# Patient Record
Sex: Female | Born: 1975 | Race: White | Hispanic: No | Marital: Married | Smoking: Former smoker
Health system: Southern US, Community
[De-identification: ages and names within clinical notes are randomized; demographics above are authoritative.]

## PROBLEM LIST (undated history)

## (undated) ENCOUNTER — Inpatient Hospital Stay (HOSPITAL_COMMUNITY): Payer: Self-pay

## (undated) DIAGNOSIS — R0602 Shortness of breath: Secondary | ICD-10-CM

## (undated) DIAGNOSIS — I499 Cardiac arrhythmia, unspecified: Secondary | ICD-10-CM

## (undated) DIAGNOSIS — R112 Nausea with vomiting, unspecified: Secondary | ICD-10-CM

## (undated) DIAGNOSIS — E039 Hypothyroidism, unspecified: Secondary | ICD-10-CM

## (undated) DIAGNOSIS — C801 Malignant (primary) neoplasm, unspecified: Secondary | ICD-10-CM

## (undated) DIAGNOSIS — D649 Anemia, unspecified: Secondary | ICD-10-CM

## (undated) DIAGNOSIS — E079 Disorder of thyroid, unspecified: Secondary | ICD-10-CM

## (undated) DIAGNOSIS — K219 Gastro-esophageal reflux disease without esophagitis: Secondary | ICD-10-CM

## (undated) DIAGNOSIS — F419 Anxiety disorder, unspecified: Secondary | ICD-10-CM

## (undated) DIAGNOSIS — O09529 Supervision of elderly multigravida, unspecified trimester: Secondary | ICD-10-CM

## (undated) DIAGNOSIS — R51 Headache: Secondary | ICD-10-CM

## (undated) DIAGNOSIS — E282 Polycystic ovarian syndrome: Secondary | ICD-10-CM

## (undated) DIAGNOSIS — Z9889 Other specified postprocedural states: Secondary | ICD-10-CM

## (undated) HISTORY — PX: GASTRIC BYPASS: SHX52

## (undated) HISTORY — DX: Anemia, unspecified: D64.9

## (undated) HISTORY — DX: Supervision of elderly multigravida, unspecified trimester: O09.529

## (undated) HISTORY — DX: Polycystic ovarian syndrome: E28.2

## (undated) HISTORY — PX: TONSILLECTOMY AND ADENOIDECTOMY: SHX28

## (undated) HISTORY — PX: EYE SURGERY: SHX253

## (undated) HISTORY — PX: OVARIAN CYST REMOVAL: SHX89

## (undated) HISTORY — PX: OTHER SURGICAL HISTORY: SHX169

## (undated) HISTORY — PX: THYROIDECTOMY: SHX17

## (undated) MED FILL — Iron Sucrose Inj 20 MG/ML (Fe Equiv): INTRAVENOUS | Qty: 15 | Status: AC

---
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
## 1997-10-13 ENCOUNTER — Ambulatory Visit (HOSPITAL_COMMUNITY): Admission: RE | Admit: 1997-10-13 | Discharge: 1997-10-13 | Payer: Self-pay | Admitting: Obstetrics

## 1997-10-17 ENCOUNTER — Inpatient Hospital Stay (HOSPITAL_COMMUNITY): Admission: AD | Admit: 1997-10-17 | Discharge: 1997-10-17 | Payer: Self-pay | Admitting: Obstetrics

## 1998-02-19 ENCOUNTER — Ambulatory Visit (HOSPITAL_COMMUNITY): Admission: RE | Admit: 1998-02-19 | Discharge: 1998-02-19 | Payer: Self-pay | Admitting: Obstetrics

## 1998-02-25 ENCOUNTER — Inpatient Hospital Stay (HOSPITAL_COMMUNITY): Admission: AD | Admit: 1998-02-25 | Discharge: 1998-02-25 | Payer: Self-pay | Admitting: Obstetrics

## 1998-05-11 ENCOUNTER — Inpatient Hospital Stay (HOSPITAL_COMMUNITY): Admission: AD | Admit: 1998-05-11 | Discharge: 1998-05-11 | Payer: Self-pay | Admitting: Obstetrics

## 1998-05-24 ENCOUNTER — Inpatient Hospital Stay (HOSPITAL_COMMUNITY): Admission: AD | Admit: 1998-05-24 | Discharge: 1998-05-27 | Payer: Self-pay | Admitting: Obstetrics

## 1998-11-22 ENCOUNTER — Encounter: Admission: RE | Admit: 1998-11-22 | Discharge: 1998-11-22 | Payer: Self-pay | Admitting: Internal Medicine

## 1999-03-22 ENCOUNTER — Other Ambulatory Visit: Admission: RE | Admit: 1999-03-22 | Discharge: 1999-03-22 | Payer: Self-pay | Admitting: Obstetrics

## 1999-05-20 ENCOUNTER — Inpatient Hospital Stay (HOSPITAL_COMMUNITY): Admission: AD | Admit: 1999-05-20 | Discharge: 1999-05-20 | Payer: Self-pay | Admitting: Obstetrics

## 1999-07-25 ENCOUNTER — Inpatient Hospital Stay (HOSPITAL_COMMUNITY): Admission: AD | Admit: 1999-07-25 | Discharge: 1999-07-25 | Payer: Self-pay | Admitting: *Deleted

## 1999-11-10 ENCOUNTER — Other Ambulatory Visit: Admission: RE | Admit: 1999-11-10 | Discharge: 1999-11-10 | Payer: Self-pay | Admitting: Obstetrics

## 2000-02-21 ENCOUNTER — Inpatient Hospital Stay (HOSPITAL_COMMUNITY): Admission: AD | Admit: 2000-02-21 | Discharge: 2000-02-21 | Payer: Self-pay | Admitting: Obstetrics

## 2000-05-03 ENCOUNTER — Inpatient Hospital Stay (HOSPITAL_COMMUNITY): Admission: AD | Admit: 2000-05-03 | Discharge: 2000-05-03 | Payer: Self-pay | Admitting: Obstetrics

## 2000-05-15 ENCOUNTER — Inpatient Hospital Stay (HOSPITAL_COMMUNITY): Admission: AD | Admit: 2000-05-15 | Discharge: 2000-05-17 | Payer: Self-pay

## 2001-04-22 ENCOUNTER — Encounter: Payer: Self-pay | Admitting: Emergency Medicine

## 2001-04-22 ENCOUNTER — Emergency Department (HOSPITAL_COMMUNITY): Admission: EM | Admit: 2001-04-22 | Discharge: 2001-04-22 | Payer: Self-pay | Admitting: Emergency Medicine

## 2002-09-30 ENCOUNTER — Inpatient Hospital Stay (HOSPITAL_COMMUNITY): Admission: AD | Admit: 2002-09-30 | Discharge: 2002-09-30 | Payer: Self-pay | Admitting: Obstetrics

## 2002-10-22 ENCOUNTER — Emergency Department (HOSPITAL_COMMUNITY): Admission: EM | Admit: 2002-10-22 | Discharge: 2002-10-22 | Payer: Self-pay | Admitting: Emergency Medicine

## 2003-04-04 ENCOUNTER — Inpatient Hospital Stay (HOSPITAL_COMMUNITY): Admission: AD | Admit: 2003-04-04 | Discharge: 2003-04-04 | Payer: Self-pay | Admitting: Obstetrics

## 2003-04-07 ENCOUNTER — Inpatient Hospital Stay (HOSPITAL_COMMUNITY): Admission: AD | Admit: 2003-04-07 | Discharge: 2003-04-07 | Payer: Self-pay | Admitting: *Deleted

## 2003-04-14 ENCOUNTER — Encounter: Payer: Self-pay | Admitting: *Deleted

## 2003-04-14 ENCOUNTER — Inpatient Hospital Stay (HOSPITAL_COMMUNITY): Admission: AD | Admit: 2003-04-14 | Discharge: 2003-04-14 | Payer: Self-pay | Admitting: *Deleted

## 2003-05-01 ENCOUNTER — Encounter: Payer: Self-pay | Admitting: *Deleted

## 2003-05-01 ENCOUNTER — Ambulatory Visit (HOSPITAL_COMMUNITY): Admission: RE | Admit: 2003-05-01 | Discharge: 2003-05-01 | Payer: Self-pay | Admitting: *Deleted

## 2003-08-27 ENCOUNTER — Inpatient Hospital Stay (HOSPITAL_COMMUNITY): Admission: AD | Admit: 2003-08-27 | Discharge: 2003-08-27 | Payer: Self-pay | Admitting: Obstetrics

## 2003-08-29 HISTORY — PX: OTHER SURGICAL HISTORY: SHX169

## 2004-01-12 ENCOUNTER — Ambulatory Visit (HOSPITAL_COMMUNITY): Admission: RE | Admit: 2004-01-12 | Discharge: 2004-01-12 | Payer: Self-pay | Admitting: Obstetrics

## 2004-08-16 ENCOUNTER — Ambulatory Visit (HOSPITAL_COMMUNITY): Admission: RE | Admit: 2004-08-16 | Discharge: 2004-08-16 | Payer: Self-pay | Admitting: Obstetrics & Gynecology

## 2004-08-18 ENCOUNTER — Ambulatory Visit (HOSPITAL_COMMUNITY): Admission: RE | Admit: 2004-08-18 | Discharge: 2004-08-18 | Payer: Self-pay | Admitting: Obstetrics & Gynecology

## 2004-09-24 ENCOUNTER — Emergency Department (HOSPITAL_COMMUNITY): Admission: EM | Admit: 2004-09-24 | Discharge: 2004-09-24 | Payer: Self-pay | Admitting: Family Medicine

## 2005-04-02 ENCOUNTER — Inpatient Hospital Stay (HOSPITAL_COMMUNITY): Admission: AD | Admit: 2005-04-02 | Discharge: 2005-04-02 | Payer: Self-pay | Admitting: Obstetrics & Gynecology

## 2005-06-07 ENCOUNTER — Inpatient Hospital Stay (HOSPITAL_COMMUNITY): Admission: AD | Admit: 2005-06-07 | Discharge: 2005-06-07 | Payer: Self-pay | Admitting: Obstetrics and Gynecology

## 2005-07-28 ENCOUNTER — Inpatient Hospital Stay (HOSPITAL_COMMUNITY): Admission: AD | Admit: 2005-07-28 | Discharge: 2005-07-28 | Payer: Self-pay | Admitting: Obstetrics and Gynecology

## 2005-08-05 ENCOUNTER — Inpatient Hospital Stay (HOSPITAL_COMMUNITY): Admission: AD | Admit: 2005-08-05 | Discharge: 2005-08-05 | Payer: Self-pay | Admitting: Obstetrics and Gynecology

## 2005-08-23 ENCOUNTER — Inpatient Hospital Stay (HOSPITAL_COMMUNITY): Admission: AD | Admit: 2005-08-23 | Discharge: 2005-08-23 | Payer: Self-pay | Admitting: Obstetrics and Gynecology

## 2005-10-31 ENCOUNTER — Inpatient Hospital Stay (HOSPITAL_COMMUNITY): Admission: AD | Admit: 2005-10-31 | Discharge: 2005-11-02 | Payer: Self-pay | Admitting: Obstetrics and Gynecology

## 2007-09-18 ENCOUNTER — Encounter (HOSPITAL_COMMUNITY): Admission: RE | Admit: 2007-09-18 | Discharge: 2007-12-17 | Payer: Self-pay | Admitting: Cardiology

## 2008-03-08 ENCOUNTER — Inpatient Hospital Stay (HOSPITAL_COMMUNITY): Admission: AD | Admit: 2008-03-08 | Discharge: 2008-03-08 | Payer: Self-pay | Admitting: *Deleted

## 2008-03-12 ENCOUNTER — Encounter (INDEPENDENT_AMBULATORY_CARE_PROVIDER_SITE_OTHER): Payer: Self-pay | Admitting: Obstetrics and Gynecology

## 2008-03-12 ENCOUNTER — Ambulatory Visit (HOSPITAL_COMMUNITY): Admission: RE | Admit: 2008-03-12 | Discharge: 2008-03-12 | Payer: Self-pay | Admitting: Obstetrics and Gynecology

## 2008-03-13 ENCOUNTER — Encounter (INDEPENDENT_AMBULATORY_CARE_PROVIDER_SITE_OTHER): Payer: Self-pay | Admitting: Obstetrics and Gynecology

## 2008-04-27 ENCOUNTER — Emergency Department (HOSPITAL_COMMUNITY): Admission: EM | Admit: 2008-04-27 | Discharge: 2008-04-27 | Payer: Self-pay | Admitting: Family Medicine

## 2008-08-28 DIAGNOSIS — C801 Malignant (primary) neoplasm, unspecified: Secondary | ICD-10-CM

## 2008-08-28 HISTORY — DX: Malignant (primary) neoplasm, unspecified: C80.1

## 2008-11-16 ENCOUNTER — Encounter (HOSPITAL_COMMUNITY): Admission: RE | Admit: 2008-11-16 | Discharge: 2009-02-14 | Payer: Self-pay | Admitting: Endocrinology

## 2008-11-26 ENCOUNTER — Ambulatory Visit (HOSPITAL_COMMUNITY): Admission: RE | Admit: 2008-11-26 | Discharge: 2008-11-26 | Payer: Self-pay | Admitting: Endocrinology

## 2008-12-08 ENCOUNTER — Encounter: Admission: RE | Admit: 2008-12-08 | Discharge: 2008-12-08 | Payer: Self-pay | Admitting: Endocrinology

## 2008-12-08 ENCOUNTER — Other Ambulatory Visit: Admission: RE | Admit: 2008-12-08 | Discharge: 2008-12-08 | Payer: Self-pay | Admitting: Interventional Radiology

## 2008-12-08 ENCOUNTER — Encounter (INDEPENDENT_AMBULATORY_CARE_PROVIDER_SITE_OTHER): Payer: Self-pay | Admitting: Interventional Radiology

## 2009-01-05 ENCOUNTER — Emergency Department (HOSPITAL_COMMUNITY): Admission: EM | Admit: 2009-01-05 | Discharge: 2009-01-06 | Payer: Self-pay | Admitting: Emergency Medicine

## 2009-03-04 ENCOUNTER — Encounter (INDEPENDENT_AMBULATORY_CARE_PROVIDER_SITE_OTHER): Payer: Self-pay | Admitting: Surgery

## 2009-03-05 ENCOUNTER — Inpatient Hospital Stay (HOSPITAL_COMMUNITY): Admission: AD | Admit: 2009-03-05 | Discharge: 2009-03-06 | Payer: Self-pay | Admitting: Surgery

## 2009-03-08 ENCOUNTER — Emergency Department (HOSPITAL_COMMUNITY): Admission: EM | Admit: 2009-03-08 | Discharge: 2009-03-09 | Payer: Self-pay | Admitting: Emergency Medicine

## 2009-04-12 ENCOUNTER — Encounter (HOSPITAL_COMMUNITY): Admission: RE | Admit: 2009-04-12 | Discharge: 2009-05-27 | Payer: Self-pay | Admitting: Endocrinology

## 2009-11-02 ENCOUNTER — Encounter (INDEPENDENT_AMBULATORY_CARE_PROVIDER_SITE_OTHER): Payer: Self-pay | Admitting: *Deleted

## 2009-12-08 ENCOUNTER — Inpatient Hospital Stay (HOSPITAL_COMMUNITY): Admission: AD | Admit: 2009-12-08 | Discharge: 2009-12-08 | Payer: Self-pay | Admitting: Obstetrics & Gynecology

## 2009-12-10 ENCOUNTER — Ambulatory Visit: Payer: Self-pay | Admitting: Gastroenterology

## 2009-12-10 ENCOUNTER — Encounter (INDEPENDENT_AMBULATORY_CARE_PROVIDER_SITE_OTHER): Payer: Self-pay | Admitting: *Deleted

## 2009-12-10 DIAGNOSIS — K5900 Constipation, unspecified: Secondary | ICD-10-CM | POA: Insufficient documentation

## 2009-12-13 LAB — CONVERTED CEMR LAB
BUN: 9 mg/dL (ref 6–23)
Basophils Absolute: 0 10*3/uL (ref 0.0–0.1)
Basophils Relative: 0.6 % (ref 0.0–3.0)
CO2: 26 meq/L (ref 19–32)
Calcium: 8.5 mg/dL (ref 8.4–10.5)
Chloride: 105 meq/L (ref 96–112)
Creatinine, Ser: 0.6 mg/dL (ref 0.4–1.2)
Eosinophils Absolute: 0.1 10*3/uL (ref 0.0–0.7)
Eosinophils Relative: 1.5 % (ref 0.0–5.0)
GFR calc non Af Amer: 123.32 mL/min (ref >60)
Glucose, Bld: 68 mg/dL — ABNORMAL LOW (ref 70–99)
HCT: 36.2 % (ref 36.0–46.0)
Hemoglobin: 12.3 g/dL (ref 12.0–15.0)
Lymphocytes Relative: 36.7 % (ref 12.0–46.0)
Lymphs Abs: 2.9 10*3/uL (ref 0.7–4.0)
MCHC: 33.9 g/dL (ref 30.0–36.0)
MCV: 80 fL (ref 78.0–100.0)
Monocytes Absolute: 0.5 10*3/uL (ref 0.1–1.0)
Monocytes Relative: 6.6 % (ref 3.0–12.0)
Neutro Abs: 4.4 10*3/uL (ref 1.4–7.7)
Neutrophils Relative %: 54.6 % (ref 43.0–77.0)
Platelets: 405 10*3/uL — ABNORMAL HIGH (ref 150.0–400.0)
Potassium: 3.7 meq/L (ref 3.5–5.1)
RBC: 4.52 M/uL (ref 3.87–5.11)
RDW: 15.4 % — ABNORMAL HIGH (ref 11.5–14.6)
Sodium: 142 meq/L (ref 135–145)
TSH: 1.9 microintl units/mL (ref 0.35–5.50)
WBC: 8 10*3/uL (ref 4.5–10.5)

## 2009-12-20 ENCOUNTER — Ambulatory Visit: Payer: Self-pay | Admitting: Gastroenterology

## 2009-12-21 ENCOUNTER — Encounter (INDEPENDENT_AMBULATORY_CARE_PROVIDER_SITE_OTHER): Payer: Self-pay | Admitting: *Deleted

## 2009-12-21 ENCOUNTER — Encounter: Payer: Self-pay | Admitting: Gastroenterology

## 2010-01-28 ENCOUNTER — Encounter: Admission: RE | Admit: 2010-01-28 | Discharge: 2010-01-28 | Payer: Self-pay | Admitting: Surgery

## 2010-02-08 ENCOUNTER — Ambulatory Visit (HOSPITAL_COMMUNITY): Admission: RE | Admit: 2010-02-08 | Discharge: 2010-02-08 | Payer: Self-pay | Admitting: Surgery

## 2010-05-30 ENCOUNTER — Encounter (HOSPITAL_COMMUNITY)
Admission: RE | Admit: 2010-05-30 | Discharge: 2010-08-28 | Payer: Self-pay | Source: Home / Self Care | Attending: Endocrinology | Admitting: Endocrinology

## 2010-07-18 IMAGING — CR DG NECK SOFT TISSUE
3 series · 3 of 3 positions shown · non-contrast
Comparison: None

CLINICAL DATA: Preop, cough, thyroid removal.

NECK SOFT TISSUES - 1+ VIEW

[w soft tissue neck * (1 of 3)]
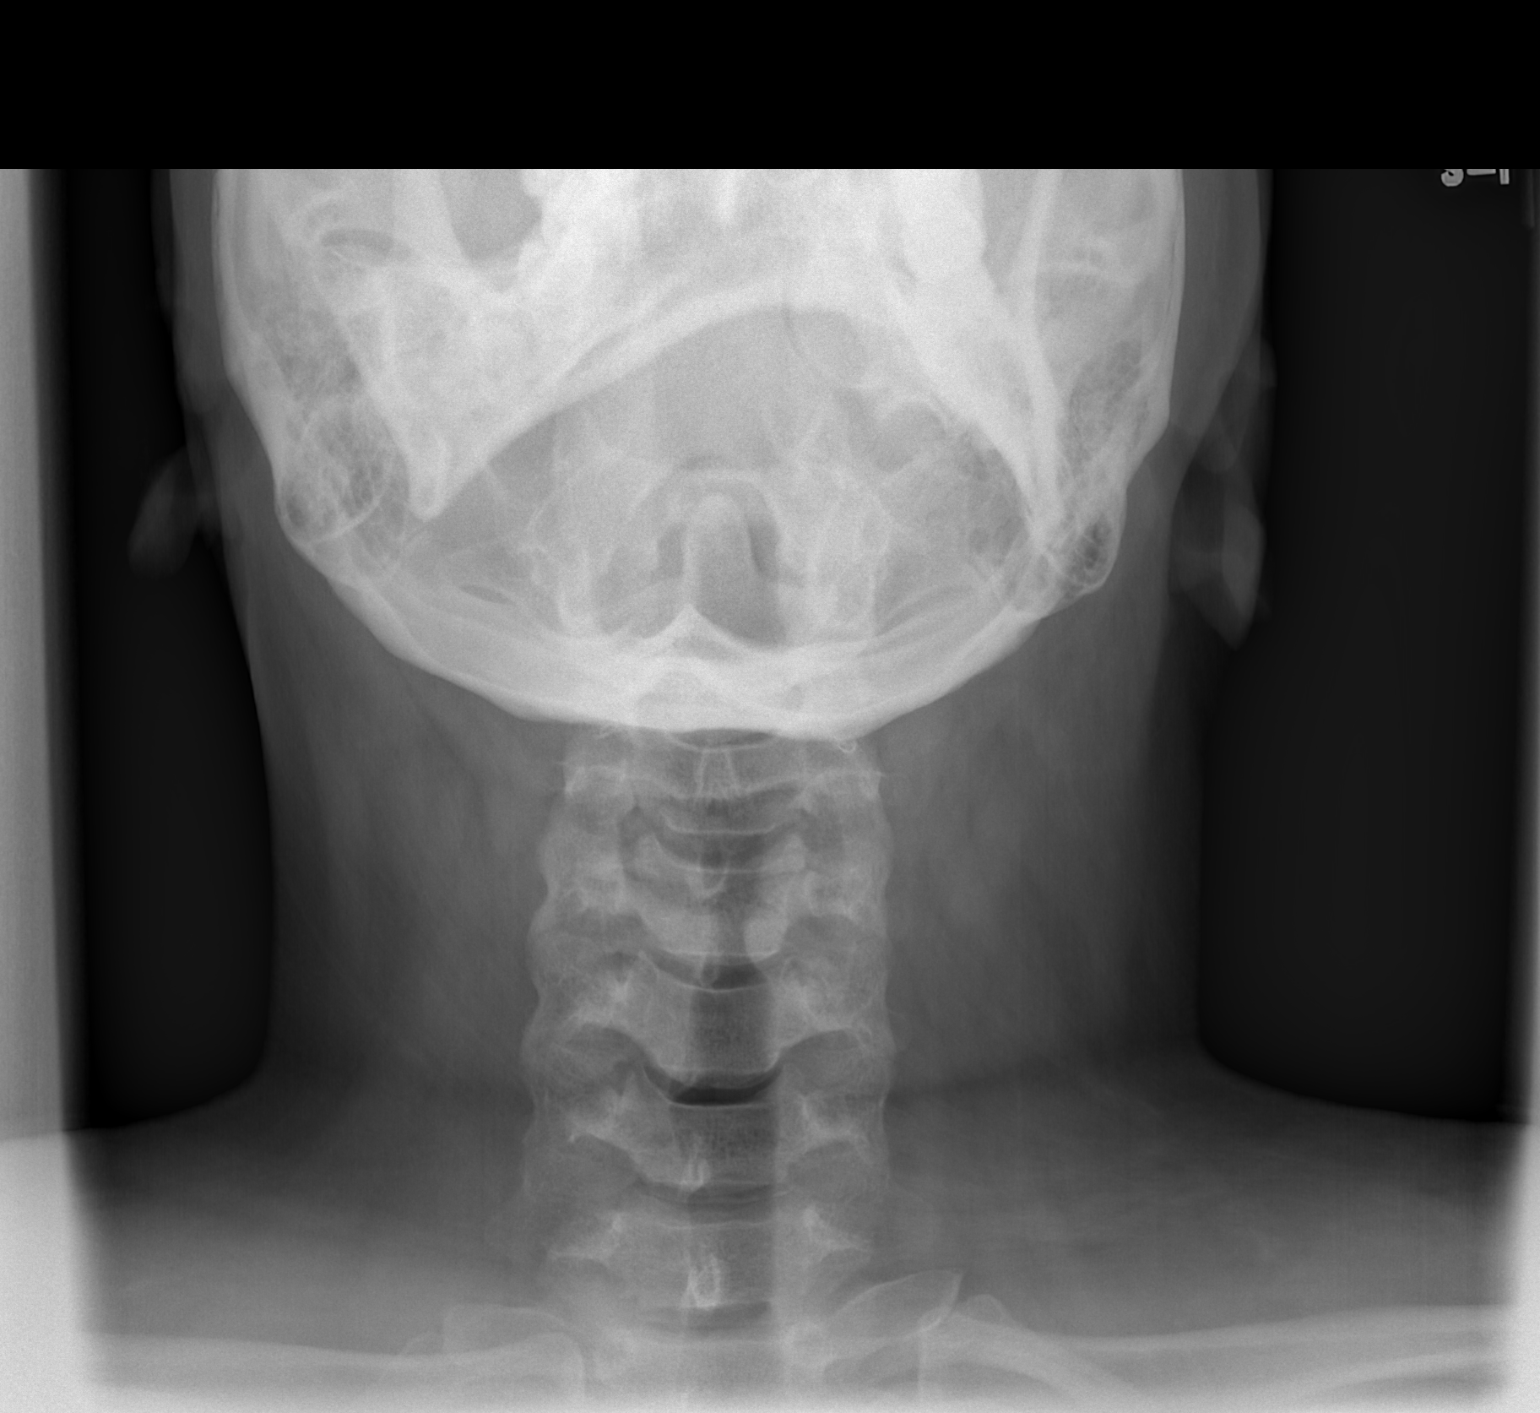

[w soft tissue neck * (2 of 3)]
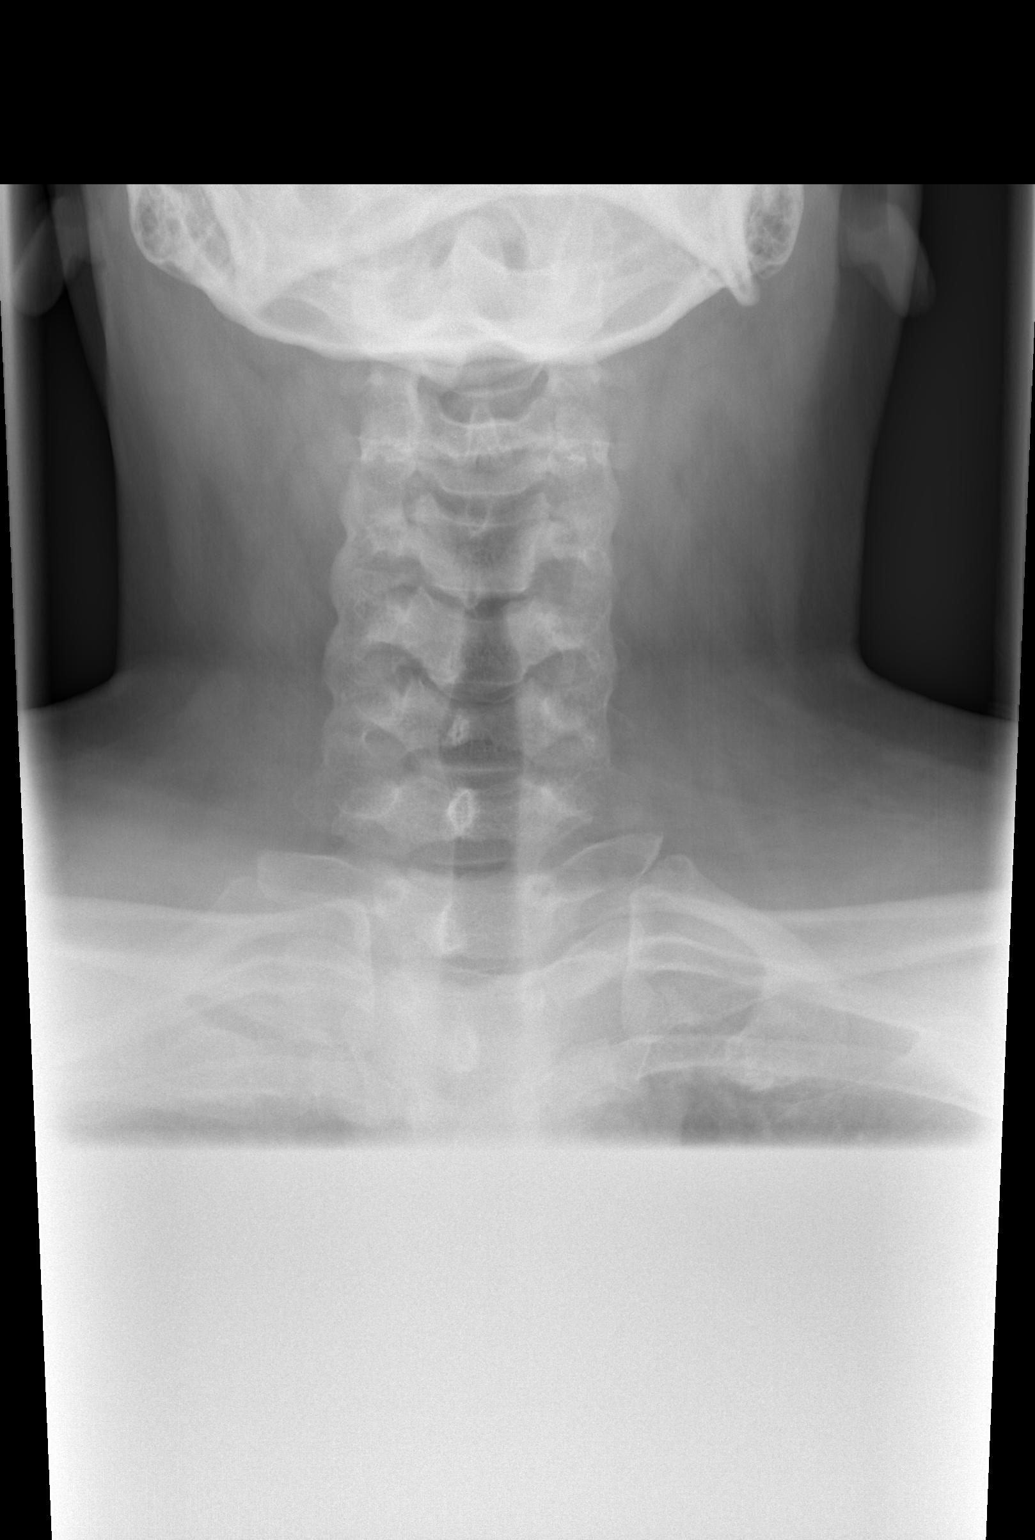

[w soft tissue neck * (3 of 3)]
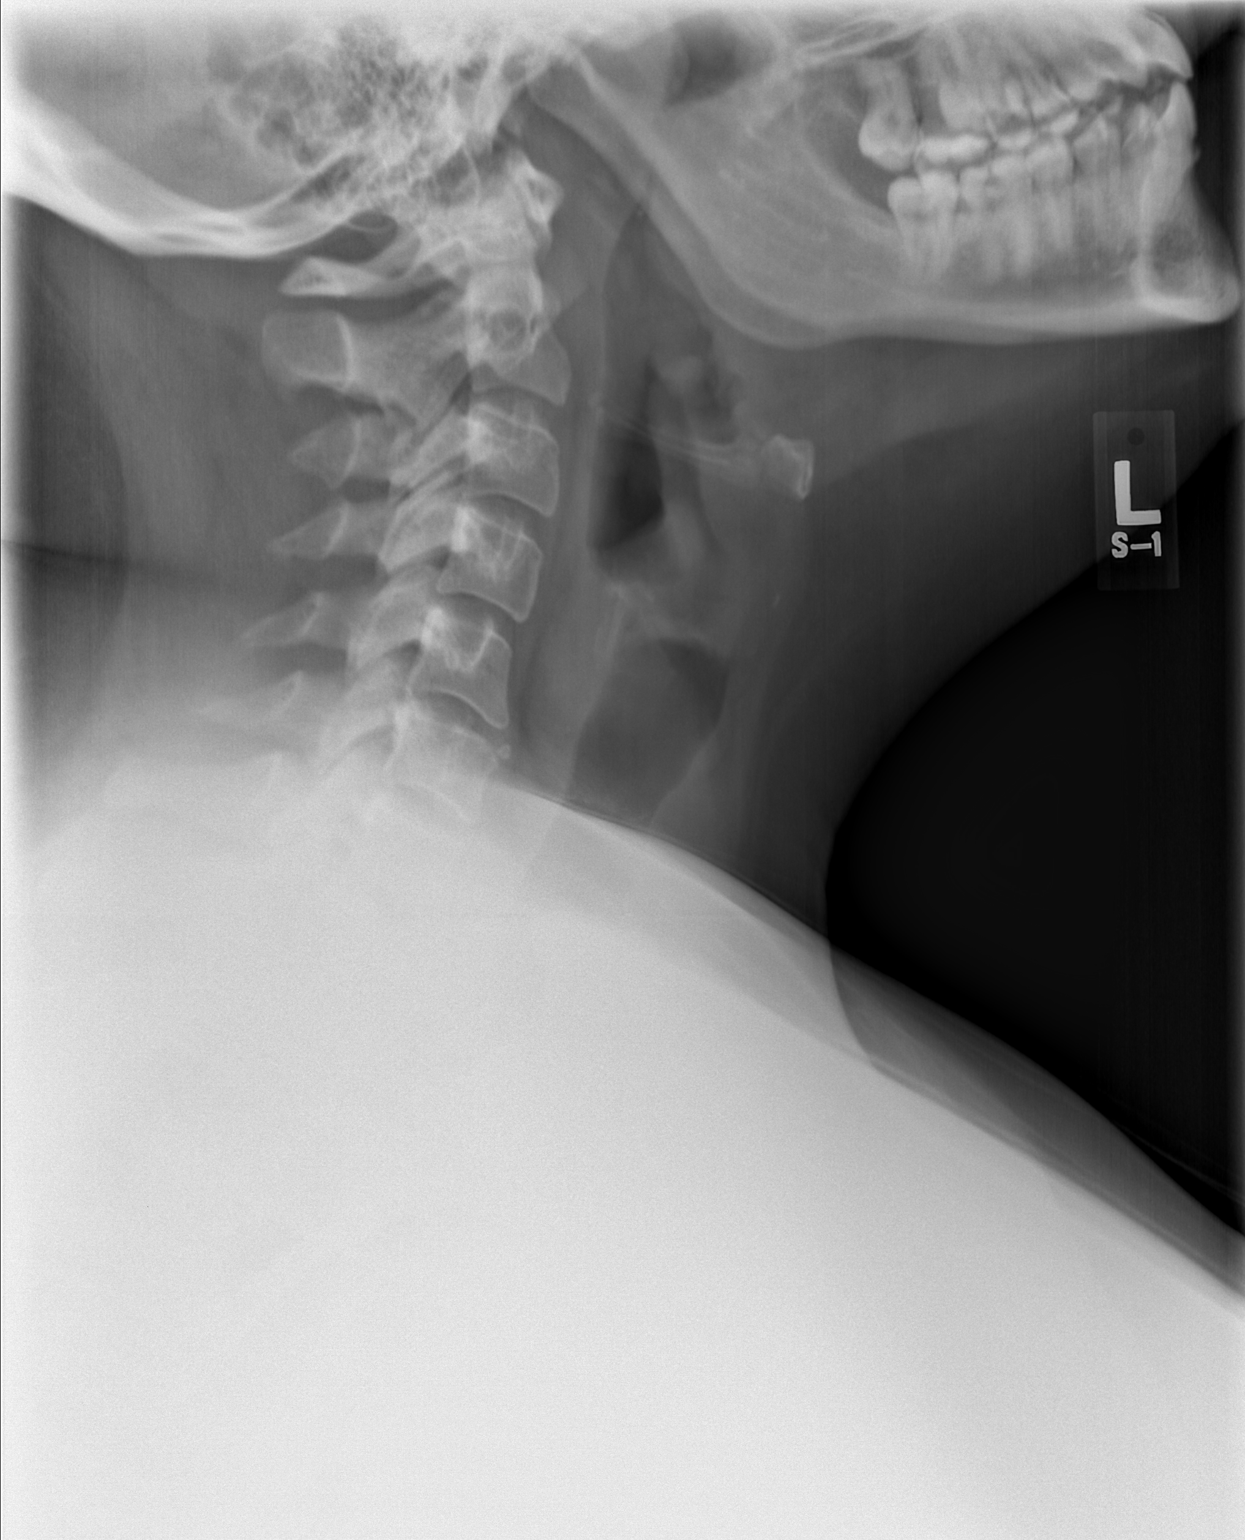

[3 of 3 positions shown; findings below may reference images not displayed]

FINDINGS: Prevertebral soft tissues are within normal limits.
Epiglottic and aryepiglottic fold shadows are sharp.  There are
mild degenerative changes in the cervical spine.
IMPRESSION: No acute findings.

## 2010-08-16 ENCOUNTER — Emergency Department (HOSPITAL_COMMUNITY)
Admission: EM | Admit: 2010-08-16 | Discharge: 2010-08-16 | Payer: Self-pay | Source: Home / Self Care | Admitting: Emergency Medicine

## 2010-08-19 ENCOUNTER — Encounter
Admission: RE | Admit: 2010-08-19 | Discharge: 2010-08-19 | Payer: Self-pay | Source: Home / Self Care | Attending: Otolaryngology | Admitting: Otolaryngology

## 2010-09-18 ENCOUNTER — Encounter: Payer: Self-pay | Admitting: Endocrinology

## 2010-09-18 ENCOUNTER — Encounter: Payer: Self-pay | Admitting: Cardiology

## 2010-09-27 NOTE — Letter (Signed)
Summary: Return to Work  [facility]  [address] [place], [place] [address]   Phone: [phone]  Fax: [phone]    12/10/2009  TO: Leodis Sias IT MAY CONCERN  RE: [name] [address] [place]   The above named individual is under my medical care and may return to work on: 12/13/09     If you have any further questions or need additional information, please call.     Sincerely,   Rob Bunting MD typed by: [name] CMA (AAMA)

## 2010-09-27 NOTE — Assessment & Plan Note (Signed)
History of Present Illness Primary GI MD: Rob Bunting MD Primary Provider: Karleen Hampshire, MD Requesting Provider: Karleen Hampshire, MD Chief Complaint: abdominal pain, constipation, rectal bleeding with irritation History of Present Illness:      very pleasant, morbidly obese 35 year old woman who has been troubled with intermittent constipation for many years.  She had surgery July 2011, thyroidectomy and her constipation has been worse since then.   She has tried dulcolax, sennekot, miralax, fiber supplement.    Most recently sennekot 2-3 pills a day.  She will usually have 1-2 BM a week. She will have to push, strain a lot.  Does see intermittently.  Has anal discomfort.  Last TSH check was in January, was normal at that point.  Sees Dr. Lisabeth Devoid.    recently has gained about 15 punds in past 6 months.           Current Medications (verified): 1)  Levothroid 125 Mcg Tabs (Levothyroxine Sodium) .Marland Kitchen.. 1 Tablet By Mouth Two Times A Day 2)  Xanax 1 Mg Tabs (Alprazolam) .... 2 By Mouth Once Daily 3)  Pantoprazole Sodium 40 Mg Tbec (Pantoprazole Sodium) .Marland Kitchen.. 1 Tablet By Mouth Once Daily  Allergies (verified): No Known Drug Allergies  Past History:  Past Medical History: thyroid cancer, resected 2000 and GERD Anxiety Disorder Chronic constipation morbid obesity  Past Surgical History: Thyroid Surgery 2010 for  cancer  Family History: father had colon polyps Brother may have Crohn's disease  Social History: she is married, she has 3 children, she works in collections, she does not smoke cigarettes, she does not drink alcohol, she drinks one caffeinated beverage per day  Review of Systems       Pertinent positive and negative review of systems were noted in the above HPI and GI specific review of systems.  All other review of systems was otherwise negative.   Vital Signs:  Patient profile:   35 year old female Height:      65 inches Weight:      331  pounds BMI:     55.28 Pulse rate:   80 / minute Pulse rhythm:   regular BP sitting:   156 / 86  (left arm)  Vitals Entered By: Milford Cage NCMA (December 10, 2009 11:02 AM)  Physical Exam  Additional Exam:  Constitutional: morbidly obese, otherwise generally well appearing Psychiatric: alert and oriented times 3 Eyes: extraocular movements intact Mouth: oropharynx moist, no lesions Neck: supple, no lymphadenopathy Cardiovascular: heart regular rate and rythm Lungs: CTA bilaterally Abdomen: soft, non-tender, non-distended, no obvious ascites, no peritoneal signs, normal bowel sounds Extremities: no lower extremity edema bilaterally Skin: no lesions on visible extremities    Impression & Recommendations:  Problem # 1:  chronic constipation, intermittent right lower quadrant pains I think her constipation is the most important issue that she has. I suspect that is contributing to her abdominal discomforts. She does have Crohn's in the family, possibly, however people with Crohn's rarely have constipation. She will start a bowel urge regimen this week and with MiraLax and then start to MiraLax scoops every day. I hope to have her wean off of her stimulant laxatives over the next week or 2. We will proceed with colonoscopy in 2-3 weeks' time. She will get a CBC, complete metabolic profile, thyroid testing today.  Other Orders: TLB-CBC Platelet - w/Differential (85025-CBCD) TLB-BMP (Basic Metabolic Panel-BMET) (80048-METABOL) TLB-TSH (Thyroid Stimulating Hormone) (32202-RKY)  Patient Instructions: 1)  You will get lab test(s) done today (cbc, bmet,  tsh). 2)  You will be scheduled to have a colonoscopy (2-3 weeks). 3)  6 doses of miralax today, 4)  6 doses of miralax tomorrow, 5)  Then start 2 scoops of miralax every day.  Try to wean OFF the laxatives over 1-2 weeks. 6)  A copy of this information will be sent to PCP. 7)  The medication list was reviewed and reconciled.  All changed  / newly prescribed medications were explained.  A complete medication list was provided to the patient / caregiver.  Appended Document: Orders Update/movi    Clinical Lists Changes  Medications: Added new medication of MOVIPREP 100 GM  SOLR (PEG-KCL-NACL-NASULF-NA ASC-C) As per prep instructions. - Signed Rx of MOVIPREP 100 GM  SOLR (PEG-KCL-NACL-NASULF-NA ASC-C) As per prep instructions.;  #1 x 0;  Signed;  Entered by: [name] CMA (AAMA);  Authorized by: Rachael Fee MD;  Method used: [facility] to [id number] [address] [place], [place], [place]  [address], Ph: [phone], Fax: [phone] Orders: Added new Test order of Colonoscopy (Colon) - Signed    Prescriptions: MOVIPREP 100 GM  SOLR (PEG-KCL-NACL-NASULF-NA ASC-C) As per prep instructions.  #1 x 0   Entered by:   [name] CMA (AAMA)   Authorized by:   Rachael Fee MD   Signed by:   [name] CMA (AAMA) on 12/10/2009   Method used:   [facility]  [address] [phone]* (retail)       [address]       [place], [place]  [address]       Ph: [phone]       Fax: [phone]   RxID:   [id number]

## 2010-09-27 NOTE — Letter (Signed)
Summary: [facility] Instructions  [facility]  [address] [place], [place] [address]   Phone: [phone]  Fax: [phone]       [name]    March 23, 1976    MRN: [id number]        Procedure Day /Date:12/20/09     Arrival Time:8 am     Procedure Time:9 am     Location of Procedure:                    X  [facility] (4th Floor)                         PREPARATION FOR COLONOSCOPY WITH MOVIPREP   Starting 5 days prior to your procedure 12/15/09 do not eat nuts, seeds, popcorn, corn, beans, peas,  salads, or any raw vegetables.  Do not take any fiber supplements (e.g. Metamucil, Citrucel, and Benefiber).  THE DAY BEFORE YOUR PROCEDURE         DATE:12/19/09  DAY: SUN  1.  Drink clear liquids the entire day-NO SOLID FOOD  2.  Do not drink anything colored red or purple.  Avoid juices with pulp.  No orange juice.  3.  Drink at least 64 oz. (8 glasses) of fluid/clear liquids during the day to prevent dehydration and help the prep work efficiently.  CLEAR LIQUIDS INCLUDE: Water Jello Ice Popsicles Tea (sugar ok, no milk/cream) Powdered fruit flavored drinks Coffee (sugar ok, no milk/cream) Gatorade Juice: apple, white grape, white cranberry  Lemonade Clear bullion, consomm, broth Carbonated beverages (any kind) Strained chicken noodle soup Hard Candy                             4.  In the morning, mix first dose of MoviPrep solution:    Empty 1 Pouch A and 1 Pouch B into the disposable container    Add lukewarm drinking water to the top line of the container. Mix to dissolve    Refrigerate (mixed solution should be used within 24 hrs)  5.  Begin drinking the prep at 5:00 p.m. The MoviPrep container is divided by 4 marks.   Every 15 minutes drink the solution down to the next mark (approximately 8 oz) until the full liter is complete.   6.  Follow completed prep with 16 oz of clear liquid of your choice (Nothing red or purple).   Continue to drink clear liquids until bedtime.  7.  Before going to bed, mix second dose of MoviPrep solution:    Empty 1 Pouch A and 1 Pouch B into the disposable container    Add lukewarm drinking water to the top line of the container. Mix to dissolve    Refrigerate  THE DAY OF YOUR PROCEDURE      DATE: 12/20/09 DAY: MON  Beginning at 4 a.m. (5 hours before procedure):         1. Every 15 minutes, drink the solution down to the next mark (approx 8 oz) until the full liter is complete.  2. Follow completed prep with 16 oz. of clear liquid of your choice.    3. You may drink clear liquids until 7 am (2 HOURS BEFORE PROCEDURE).   MEDICATION INSTRUCTIONS  Unless otherwise instructed, you should take regular prescription medications with a small sip of water   as early as possible the morning of your procedure.  OTHER INSTRUCTIONS  You will need a responsible adult at least 33 years of age to accompany you and drive you home.   This person must remain in the waiting room during your procedure.  Wear loose fitting clothing that is easily removed.  Leave jewelry and other valuables at home.  However, you may wish to bring a book to read or  an iPod/MP3 player to listen to music as you wait for your procedure to start.  Remove all body piercing jewelry and leave at home.  Total time from sign-in until discharge is approximately 2-3 hours.  You should go home directly after your procedure and rest.  You can resume normal activities the  day after your procedure.  The day of your procedure you should not:   Drive   Make legal decisions   Operate machinery   Drink alcohol   Return to work  You will receive specific instructions about eating, activities and medications before you leave.    The above instructions have been reviewed and explained to me by   _______________________    I fully understand and can verbalize these instructions  _____________________________ Date _________

## 2010-09-27 NOTE — Letter (Signed)
Summary: Results Letter  [facility]  [address] [place], [place] [address]   Phone: [phone]  Fax: [phone]        December 21, 2009 MRN: [id number]    [facility] [address] [place], [place]  [address]    Dear [name],   Good news.  The polyp(s) that were removed during your recent procedure were NOT pre-cancerous.  You should continue to follow current colorectal cancer screening guidelines with a colonoscopy at age 59.  You should continue the recommnedations outlined after your colonoscopy, written on your colonoscopy report.    Sincerely,  Rachael Fee MD  This letter has been electronically signed by your physician.  Appended Document: Results Letter letter mailed 4.28.11

## 2010-09-27 NOTE — Procedures (Signed)
Summary: Colonoscopy  Patient: [name] Note: All result statuses are Final unless otherwise noted.  Tests: (1) Colonoscopy (COL)   COL Colonoscopy           DONE     [facility]     [address]     [place], [place]  [address]           COLONOSCOPY PROCEDURE REPORT           PATIENT:  [name], [name]  MR#:  [id number]     BIRTHDATE:  19-Dec-1975, 33 yrs. old  GENDER:  female     ENDOSCOPIST:  Rachael Fee, MD     REF. BY:  Karleen Hampshire, M.D.     PROCEDURE DATE:  12/20/2009     PROCEDURE:  Colonoscopy with biopsy     ASA CLASS:  Class II     INDICATIONS:  constipation, abdominal pains     MEDICATIONS:   Fentanyl 50 mcg IV, Versed 5 mg IV           DESCRIPTION OF PROCEDURE:   After the risks benefits and     alternatives of the procedure were thoroughly explained, informed     consent was obtained.  Digital rectal exam was performed and     revealed no rectal masses.   The Pentax Colonoscope [id number]     endoscope was introduced through the anus and advanced to the     terminal ileum which was intubated for a short distance, without     limitations.  The quality of the prep was adequate, using     MoviPrep.  The instrument was then slowly withdrawn as the colon     was fully examined.     <<PROCEDUREIMAGES>>           FINDINGS:  A diminutive polyp was found terminal ileum This was     1-23mm across, removed with forceps and sent to pathology (jar 1)     (see image4).  The terminal ileum appeared normal (see image2).     This was otherwise a normal examination of the colon (see image1,     image3, and image6).   Retroflexed views in the rectum revealed no     abnormalities.    The scope was then withdrawn from the patient     and the procedure completed.           COMPLICATIONS:  None     ENDOSCOPIC IMPRESSION:     1) Diminutive polyp in the terminal ileum     2) Normal terminal ileum     3) Otherwise normal examination        RECOMMENDATIONS:     1) If the polyp(s) removed today are proven to be adenomatous     (pre-cancerous) polyps, you will need a repeat colonoscopy in 5     years. Otherwise you should continue to follow colorectal cancer     screening guidelines for "routine risk" patients with colonoscopy     at age 63.     2) You will receive a letter within 1-2 weeks with the results     of your biopsy as well as final recommendations. Please call my     office if you have not received a letter after 3 weeks.     3) Continue two doses of miralax every day.     4) Dr. Christella Hartigan' office will contact you for a follow up appt in 5-6  weeks to see how your abdominal discomforts have responded to     bowel regulation with miralax.           ______________________________     Rachael Fee, MD           n.     eSIGNED:   Rachael Fee at 12/20/2009 09:18 AM           Ardyth Harps, [id number]  Note: An exclamation mark (!) indicates a result that was not dispersed into the flowsheet. Document Creation Date: 12/20/2009 9:19 AM _______________________________________________________________________  (1) Order result status: Final Collection or observation date-time: 12/20/2009 09:13 Requested date-time:  Receipt date-time:  Reported date-time:  Referring Physician:   Ordering Physician: Rob Bunting [phone]) Specimen Source:  Source: Launa Grill Order Number: [id number] Lab site:   Appended Document: Colonoscopy appt made letter mailed  Appended Document: Colonoscopy     Procedures Next Due Date:    Colonoscopy: 02/2028

## 2010-09-27 NOTE — Letter (Signed)
Summary: Appt Reminder 2  [facility]  [address] [place], [place] [address]   Phone: [phone]  Fax: [phone]        December 21, 2009 MRN: [id number]    [facility] [address] [place], [place]  [address]    Dear [name],   You have a return appointment with Dr. Christella Hartigan on 01/26/10 at 9:15am.  Please remember to bring a complete list of the medicines you are taking, your insurance card and your co-pay.  If you have to cancel or reschedule this appointment, please call before 5:00 pm the evening before to avoid a cancellation fee.  If you have any questions or concerns, please call [phone].    Sincerely,    [name] CMA (AAMA)

## 2010-09-27 NOTE — Letter (Signed)
Summary: Appointment Reminder  [facility]  [address]   [place], [place] [address]   Phone: [phone]  Fax: [phone]       November 02, 2009   [facility] [address] [place], [place]  [address] 06/05/1976    Dear Ms. Noblett,  We have been unable to reach you by phone to schedule a follow up   appointment that was recommended for you by Dr. Jena Gauss. It is very   important that we reach you to schedule an appointment. We hope that you  allow [place] to participate in your health care needs. Please contact us at  [phone] at your earliest convenience to schedule your appointment.  Sincerely,    [facility] R. Roetta Sessions, M.D.    Kassie Mends, M.D. Lorenza Burton, FNP-BC    Tana Coast, PA-C Phone: [phone]    Fax: [phone]

## 2010-11-07 LAB — DIFFERENTIAL
Basophils Absolute: 0 10*3/uL (ref 0.0–0.1)
Basophils Relative: 0 % (ref 0–1)
Eosinophils Absolute: 0.1 10*3/uL (ref 0.0–0.7)
Eosinophils Relative: 1 % (ref 0–5)
Lymphocytes Relative: 27 % (ref 12–46)
Lymphs Abs: 2.8 10*3/uL (ref 0.7–4.0)
Monocytes Absolute: 0.8 10*3/uL (ref 0.1–1.0)
Monocytes Relative: 7 % (ref 3–12)
Neutro Abs: 6.6 10*3/uL (ref 1.7–7.7)
Neutrophils Relative %: 64 % (ref 43–77)

## 2010-11-07 LAB — CBC
HCT: 40.2 % (ref 36.0–46.0)
Hemoglobin: 12.9 g/dL (ref 12.0–15.0)
MCH: 27 pg (ref 26.0–34.0)
MCHC: 32.1 g/dL (ref 30.0–36.0)
MCV: 84.3 fL (ref 78.0–100.0)
Platelets: 363 10*3/uL (ref 150–400)
RBC: 4.77 MIL/uL (ref 3.87–5.11)
RDW: 14.1 % (ref 11.5–15.5)
WBC: 10.4 10*3/uL (ref 4.0–10.5)

## 2010-11-10 LAB — HCG, SERUM, QUALITATIVE: Preg, Serum: NEGATIVE

## 2010-11-14 LAB — CBC
HCT: 37.7 % (ref 36.0–46.0)
Hemoglobin: 12.9 g/dL (ref 12.0–15.0)
MCHC: 34.2 g/dL (ref 30.0–36.0)
MCV: 82.3 fL (ref 78.0–100.0)
Platelets: 334 10*3/uL (ref 150–400)
RBC: 4.58 MIL/uL (ref 3.87–5.11)
RDW: 16 % — ABNORMAL HIGH (ref 11.5–15.5)
WBC: 8.1 10*3/uL (ref 4.0–10.5)

## 2010-11-14 LAB — PROTIME-INR
INR: 0.97 (ref 0.00–1.49)
Prothrombin Time: 12.8 seconds (ref 11.6–15.2)

## 2010-11-14 LAB — APTT: aPTT: 27 seconds (ref 24–37)

## 2010-11-16 LAB — DIFFERENTIAL
Basophils Absolute: 0.1 10*3/uL (ref 0.0–0.1)
Basophils Relative: 1 % (ref 0–1)
Eosinophils Absolute: 0.2 10*3/uL (ref 0.0–0.7)
Eosinophils Relative: 2 % (ref 0–5)
Lymphocytes Relative: 33 % (ref 12–46)
Lymphs Abs: 3.5 10*3/uL (ref 0.7–4.0)
Monocytes Absolute: 0.5 10*3/uL (ref 0.1–1.0)
Monocytes Relative: 5 % (ref 3–12)
Neutro Abs: 6.3 10*3/uL (ref 1.7–7.7)
Neutrophils Relative %: 60 % (ref 43–77)

## 2010-11-16 LAB — CBC
HCT: 37 % (ref 36.0–46.0)
Hemoglobin: 12.3 g/dL (ref 12.0–15.0)
MCHC: 33.3 g/dL (ref 30.0–36.0)
MCV: 81.3 fL (ref 78.0–100.0)
Platelets: 369 10*3/uL (ref 150–400)
RBC: 4.55 MIL/uL (ref 3.87–5.11)
RDW: 14.5 % (ref 11.5–15.5)
WBC: 10.5 10*3/uL (ref 4.0–10.5)

## 2010-11-16 LAB — URINALYSIS, ROUTINE W REFLEX MICROSCOPIC
Bilirubin Urine: NEGATIVE
Glucose, UA: NEGATIVE mg/dL
Hgb urine dipstick: NEGATIVE
Ketones, ur: NEGATIVE mg/dL
Nitrite: NEGATIVE
Protein, ur: NEGATIVE mg/dL
Specific Gravity, Urine: 1.02 (ref 1.005–1.030)
Urobilinogen, UA: 0.2 mg/dL (ref 0.0–1.0)
pH: 6.5 (ref 5.0–8.0)

## 2010-11-16 LAB — WET PREP, GENITAL
Clue Cells Wet Prep HPF POC: NONE SEEN
Trich, Wet Prep: NONE SEEN
Yeast Wet Prep HPF POC: NONE SEEN

## 2010-11-16 LAB — POCT PREGNANCY, URINE: Preg Test, Ur: NEGATIVE

## 2010-11-16 LAB — GC/CHLAMYDIA PROBE AMP, GENITAL
Chlamydia, DNA Probe: NEGATIVE
GC Probe Amp, Genital: NEGATIVE

## 2010-11-27 ENCOUNTER — Ambulatory Visit (INDEPENDENT_AMBULATORY_CARE_PROVIDER_SITE_OTHER): Payer: Self-pay

## 2010-11-27 ENCOUNTER — Inpatient Hospital Stay (INDEPENDENT_AMBULATORY_CARE_PROVIDER_SITE_OTHER)
Admission: RE | Admit: 2010-11-27 | Discharge: 2010-11-27 | Disposition: A | Discharge status: Home / Self Care | Payer: Self-pay | Source: Ambulatory Visit | Attending: Family Medicine | Admitting: Family Medicine

## 2010-11-27 DIAGNOSIS — S92309A Fracture of unspecified metatarsal bone(s), unspecified foot, initial encounter for closed fracture: Secondary | ICD-10-CM

## 2010-12-02 LAB — HCG, SERUM, QUALITATIVE: Preg, Serum: NEGATIVE

## 2010-12-03 LAB — HCG, SERUM, QUALITATIVE: Preg, Serum: NEGATIVE

## 2010-12-04 LAB — URINALYSIS, ROUTINE W REFLEX MICROSCOPIC
Bilirubin Urine: NEGATIVE
Bilirubin Urine: NEGATIVE
Glucose, UA: NEGATIVE mg/dL
Glucose, UA: NEGATIVE mg/dL
Hgb urine dipstick: NEGATIVE
Ketones, ur: NEGATIVE mg/dL
Ketones, ur: NEGATIVE mg/dL
Leukocytes, UA: NEGATIVE
Nitrite: NEGATIVE
Nitrite: NEGATIVE
Protein, ur: NEGATIVE mg/dL
Protein, ur: NEGATIVE mg/dL
Specific Gravity, Urine: 1.009 (ref 1.005–1.030)
Specific Gravity, Urine: 1.024 (ref 1.005–1.030)
Urobilinogen, UA: 0.2 mg/dL (ref 0.0–1.0)
Urobilinogen, UA: 0.2 mg/dL (ref 0.0–1.0)
pH: 7.5 (ref 5.0–8.0)
pH: 7 (ref 5.0–8.0)

## 2010-12-04 LAB — PROTIME-INR
INR: 1 (ref 0.00–1.49)
Prothrombin Time: 13.5 seconds (ref 11.6–15.2)

## 2010-12-04 LAB — URINE MICROSCOPIC-ADD ON

## 2010-12-04 LAB — CBC
HCT: 44.9 % (ref 36.0–46.0)
HCT: 42.2 % (ref 36.0–46.0)
Hemoglobin: 15.4 g/dL — ABNORMAL HIGH (ref 12.0–15.0)
Hemoglobin: 14 g/dL (ref 12.0–15.0)
MCHC: 34.2 g/dL (ref 30.0–36.0)
MCHC: 33.1 g/dL (ref 30.0–36.0)
MCV: 80 fL (ref 78.0–100.0)
MCV: 80.9 fL (ref 78.0–100.0)
Platelets: 450 10*3/uL — ABNORMAL HIGH (ref 150–400)
Platelets: 364 10*3/uL (ref 150–400)
RBC: 5.61 MIL/uL — ABNORMAL HIGH (ref 3.87–5.11)
RBC: 5.22 MIL/uL — ABNORMAL HIGH (ref 3.87–5.11)
RDW: 13.8 % (ref 11.5–15.5)
RDW: 14.5 % (ref 11.5–15.5)
WBC: 14.9 10*3/uL — ABNORMAL HIGH (ref 4.0–10.5)
WBC: 9.6 10*3/uL (ref 4.0–10.5)

## 2010-12-04 LAB — DIFFERENTIAL
Basophils Absolute: 0 10*3/uL (ref 0.0–0.1)
Basophils Absolute: 0 10*3/uL (ref 0.0–0.1)
Basophils Relative: 0 % (ref 0–1)
Basophils Relative: 0 % (ref 0–1)
Eosinophils Absolute: 0 10*3/uL (ref 0.0–0.7)
Eosinophils Absolute: 0.1 10*3/uL (ref 0.0–0.7)
Eosinophils Relative: 0 % (ref 0–5)
Eosinophils Relative: 1 % (ref 0–5)
Lymphocytes Relative: 17 % (ref 12–46)
Lymphocytes Relative: 30 % (ref 12–46)
Lymphs Abs: 2.5 10*3/uL (ref 0.7–4.0)
Lymphs Abs: 2.8 10*3/uL (ref 0.7–4.0)
Monocytes Absolute: 0.6 10*3/uL (ref 0.1–1.0)
Monocytes Absolute: 0.5 10*3/uL (ref 0.1–1.0)
Monocytes Relative: 4 % (ref 3–12)
Monocytes Relative: 5 % (ref 3–12)
Neutro Abs: 11.7 10*3/uL — ABNORMAL HIGH (ref 1.7–7.7)
Neutro Abs: 6.1 10*3/uL (ref 1.7–7.7)
Neutrophils Relative %: 79 % — ABNORMAL HIGH (ref 43–77)
Neutrophils Relative %: 64 % (ref 43–77)

## 2010-12-04 LAB — COMPREHENSIVE METABOLIC PANEL
ALT: 23 U/L (ref 0–35)
AST: 29 U/L (ref 0–37)
Albumin: 3.6 g/dL (ref 3.5–5.2)
Alkaline Phosphatase: 94 U/L (ref 39–117)
BUN: 6 mg/dL (ref 6–23)
CO2: 24 mEq/L (ref 19–32)
Calcium: 9.5 mg/dL (ref 8.4–10.5)
Chloride: 100 mEq/L (ref 96–112)
Creatinine, Ser: 0.61 mg/dL (ref 0.4–1.2)
GFR calc Af Amer: >60 mL/min (ref >60)
GFR calc non Af Amer: >60 mL/min (ref >60)
Glucose, Bld: 118 mg/dL — ABNORMAL HIGH (ref 70–99)
Potassium: 3.6 mEq/L (ref 3.5–5.1)
Sodium: 135 mEq/L (ref 135–145)
Total Bilirubin: 0.6 mg/dL (ref 0.3–1.2)
Total Protein: 8.3 g/dL (ref 6.0–8.3)

## 2010-12-04 LAB — BASIC METABOLIC PANEL
BUN: 11 mg/dL (ref 6–23)
BUN: 9 mg/dL (ref 6–23)
CO2: 27 mEq/L (ref 19–32)
CO2: 27 mEq/L (ref 19–32)
Calcium: 8.4 mg/dL (ref 8.4–10.5)
Calcium: 9.6 mg/dL (ref 8.4–10.5)
Chloride: 104 mEq/L (ref 96–112)
Chloride: 104 mEq/L (ref 96–112)
Creatinine, Ser: 0.54 mg/dL (ref 0.4–1.2)
Creatinine, Ser: 0.58 mg/dL (ref 0.4–1.2)
GFR calc Af Amer: >60 mL/min (ref >60)
GFR calc Af Amer: >60 mL/min (ref >60)
GFR calc non Af Amer: >60 mL/min (ref >60)
GFR calc non Af Amer: >60 mL/min (ref >60)
Glucose, Bld: 140 mg/dL — ABNORMAL HIGH (ref 70–99)
Glucose, Bld: 92 mg/dL (ref 70–99)
Potassium: 3.9 mEq/L (ref 3.5–5.1)
Potassium: 4 mEq/L (ref 3.5–5.1)
Sodium: 138 mEq/L (ref 135–145)
Sodium: 139 mEq/L (ref 135–145)

## 2010-12-04 LAB — PREGNANCY, URINE
Preg Test, Ur: NEGATIVE
Preg Test, Ur: NEGATIVE

## 2010-12-04 LAB — CALCIUM
Calcium: 8 mg/dL — ABNORMAL LOW (ref 8.4–10.5)
Calcium: 8.1 mg/dL — ABNORMAL LOW (ref 8.4–10.5)
Calcium: 8.7 mg/dL (ref 8.4–10.5)

## 2010-12-04 LAB — LIPASE, BLOOD: Lipase: 18 U/L (ref 11–59)

## 2010-12-06 LAB — DIFFERENTIAL
Basophils Absolute: 0.1 10*3/uL (ref 0.0–0.1)
Basophils Relative: 1 % (ref 0–1)
Eosinophils Absolute: 0.1 10*3/uL (ref 0.0–0.7)
Eosinophils Relative: 1 % (ref 0–5)
Lymphocytes Relative: 42 % (ref 12–46)
Lymphs Abs: 3.5 10*3/uL (ref 0.7–4.0)
Monocytes Absolute: 0.7 10*3/uL (ref 0.1–1.0)
Monocytes Relative: 8 % (ref 3–12)
Neutro Abs: 4 10*3/uL (ref 1.7–7.7)
Neutrophils Relative %: 48 % (ref 43–77)

## 2010-12-06 LAB — URINE MICROSCOPIC-ADD ON

## 2010-12-06 LAB — COMPREHENSIVE METABOLIC PANEL
ALT: 26 U/L (ref 0–35)
AST: 18 U/L (ref 0–37)
Albumin: 3.1 g/dL — ABNORMAL LOW (ref 3.5–5.2)
Alkaline Phosphatase: 80 U/L (ref 39–117)
BUN: 10 mg/dL (ref 6–23)
CO2: 27 mEq/L (ref 19–32)
Calcium: 8.9 mg/dL (ref 8.4–10.5)
Chloride: 106 mEq/L (ref 96–112)
Creatinine, Ser: 0.66 mg/dL (ref 0.4–1.2)
GFR calc Af Amer: >60 mL/min (ref >60)
GFR calc non Af Amer: >60 mL/min (ref >60)
Glucose, Bld: 121 mg/dL — ABNORMAL HIGH (ref 70–99)
Potassium: 4.2 mEq/L (ref 3.5–5.1)
Sodium: 139 mEq/L (ref 135–145)
Total Bilirubin: 0.3 mg/dL (ref 0.3–1.2)
Total Protein: 6.8 g/dL (ref 6.0–8.3)

## 2010-12-06 LAB — URINALYSIS, ROUTINE W REFLEX MICROSCOPIC
Bilirubin Urine: NEGATIVE
Glucose, UA: NEGATIVE mg/dL
Hgb urine dipstick: NEGATIVE
Ketones, ur: NEGATIVE mg/dL
Nitrite: NEGATIVE
Protein, ur: NEGATIVE mg/dL
Specific Gravity, Urine: 1.022 (ref 1.005–1.030)
Urobilinogen, UA: 0.2 mg/dL (ref 0.0–1.0)
pH: 7 (ref 5.0–8.0)

## 2010-12-06 LAB — CBC
HCT: 37.5 % (ref 36.0–46.0)
Hemoglobin: 12.8 g/dL (ref 12.0–15.0)
MCHC: 34.1 g/dL (ref 30.0–36.0)
MCV: 79.3 fL (ref 78.0–100.0)
Platelets: 372 10*3/uL (ref 150–400)
RBC: 4.72 MIL/uL (ref 3.87–5.11)
RDW: 14.1 % (ref 11.5–15.5)
WBC: 8.3 10*3/uL (ref 4.0–10.5)

## 2010-12-06 LAB — D-DIMER, QUANTITATIVE (NOT AT ARMC): D-Dimer, Quant: <0.22 ug/mL-FEU (ref 0.00–0.48)

## 2010-12-06 LAB — GLUCOSE, CAPILLARY: Glucose-Capillary: 131 mg/dL — ABNORMAL HIGH (ref 70–99)

## 2011-01-10 NOTE — Op Note (Signed)
[name], [name]          ACCOUNT NO.:  [id number]   MEDICAL RECORD NO.:  [id number]          PATIENT TYPE:  AMB   LOCATION:  DAY                          FACILITY:  [facility]   PHYSICIAN:  Velora Heckler, MD      DATE OF BIRTH:  1976-04-16   DATE OF PROCEDURE:  03/04/2009  DATE OF DISCHARGE:                               OPERATIVE REPORT   PREOPERATIVE DIAGNOSIS:  Bilateral thyroid nodules, hyperthyroidism.   POSTOPERATIVE DIAGNOSIS:  Bilateral thyroid nodules, hyperthyroidism.   PROCEDURE:  Total thyroidectomy.   SURGEON:  Velora Heckler, M.D., FACS   ASSISTANT:  Harriette Bouillon, M.D., FACS   ANESTHESIA:  General per Dr. Brayton Caves.   ESTIMATED BLOOD LOSS:  Minimal.   PREPARATION:  ChloraPrep.   COMPLICATIONS:  None.   INDICATIONS:  The patient is a 35 year old white female from  [place], [place].  She had been diagnosed in 2008 with  hyperthyroidism.  She was initially managed with methimazole and beta  blockade.  Thyroid ultrasound showed bilateral thyroid nodules.  Needle  aspiration was consistent with hyperplastic nodules.  The patient has  developed mild compressive symptoms.  She is referred at this time by  her endocrinologist for thyroidectomy.   DESCRIPTION OF OPERATION:  Procedure is done in OR #11 at the [facility].  The patient was brought to the operating room,  placed in supine position on the operating room table.  Following  administration of general anesthesia the patient is positioned and then  prepped and draped in the usual strict aseptic fashion.  After  ascertaining that an adequate level of anesthesia had been achieved, a  Kocher incision was made with a #15 blade.  Dissection was carried down  through subcutaneous tissues and platysma.  Hemostasis was obtained with  the electrocautery.  Skin flaps were elevated cephalad and caudad from  the thyroid notch to the sternal notch.  A Mahorner self-retaining  retractor was placed for exposure.  Strap muscles are incised in the  midline with the electrocautery.  Dissection is initially begun on the  left side.   Strap muscles were reflected to the left exposing the left thyroid lobe.  It is multinodular.  Gland is gently mobilized with blunt dissection.  Middle thyroid vein is identified, dissected out, and divided between  medium Ligaclips with the Harmonic scalpel.  Gland is rolled anteriorly.  Inferior venous tributaries are divided between Ligaclips with the  Harmonic scalpel.  Care is taken to preserve the inferior parathyroid  gland.  Superior pole vessels are dissected out and divided between  medium Ligaclips with the Harmonic scalpel.  Gland is rolled anteriorly.  Superior parathyroid gland is dissected off of the capsule and  maintained on its vascular pedicle.  Branches of the inferior thyroid  artery are divided between small and medium Ligaclips.  Recurrent nerve  is identified and preserved.  Ligament of Allyson Sabal is transected with the  electrocautery and the gland is mobilized up and onto the anterior  trachea.  There is no significant pyramidal lobe.  Isthmus is mobilized  across the midline using the electrocautery  for hemostasis.   A dry pack is placed in the left neck.  Strap muscles on the right are  reflected laterally and the right lobe is exposed.  Right lobe is also  multinodular.  There is a firm, slightly less than 1 cm hard nodule in  the posterior aspect of the right superior pole.  This may be consistent  with a small carcinoma.  Middle thyroid vein is divided between medium  Ligaclips with the Harmonic scalpel.  Inferior venous tributaries are  divided between medium Ligaclips with the Harmonic scalpel.  Gland is  rolled anteriorly.  Parathyroid tissue is identified and preserved.  Branches of the superior thyroid artery are divided between medium  Ligaclips with the Harmonic scalpel.  Gland is rolled anteriorly.   Recurrent nerve is identified and preserved.  Branches of the inferior  thyroid artery are divided between small and medium Ligaclips.  Ligament  of Allyson Sabal is transected with the electrocautery and the gland is  mobilized up and onto the trachea.  Inferior venous tributaries to the  isthmus are divided between medium Ligaclips.  Gland is excised off the  trachea and submitted to pathology for review.  Neck is irrigated with  warm saline.  Surgicel is placed in the operative field.  Strap muscles  are reapproximated in the midline with interrupted 3-0 Vicryl sutures.  Platysma is closed with interrupted 3-0 Vicryl sutures.  Skin is closed  with a running 4-0 Monocryl subcuticular suture.  Wound is washed and  dried and Benzoin and Steri-Strips are applied.  Sterile dressings are  applied.  The patient is awakened from anesthesia and brought to the  recovery room in stable condition.  The patient tolerated the procedure  well.      Velora Heckler, MD  Electronically Signed     TMG/MEDQ  D:  03/04/2009  T:  03/04/2009  Job:  [id number]   cc:   Dorisann Frames, M.D.  Fax: [phone]   Kirk Ruths, M.D.  Fax: [phone]   Eduardo Osier. Sharyn Lull, M.D.  Fax: [phone]   Lenoard Aden, M.D.  Fax: [phone]

## 2011-01-10 NOTE — Op Note (Signed)
[name], [name]          ACCOUNT NO.:  [id number]   MEDICAL RECORD NO.:  [id number]          PATIENT TYPE:  AMB   LOCATION:  SDC                           [facility]ILITY:  [facility]   PHYSICIAN:  Lenoard Aden, M.D.DATE OF BIRTH:  1975/09/22   DATE OF PROCEDURE:  03/12/2008  DATE OF DISCHARGE:                               OPERATIVE REPORT   PREOPERATIVE DIAGNOSIS:  Left adnexal mass with pelvic pain.   POSTOPERATIVE DIAGNOSIS:  A large left paratubal cyst approximately 10  cm, right paratubal cyst.   PROCEDURES:  1. Diagnostic laparoscopy with excision of left paratubal cyst.  2. Left salpingectomy.  3. Right paratubal cystectomy.   SURGEON:  Lenoard Aden, MD   ANESTHESIA:  General.   BLOOD LOSS:  Less than 50 mL.   COMPLICATIONS:  None.   DRAINS:  Foley.   COUNTS:  Correct.   The patient was taken to recovery in good condition.   BRIEF OPERATIVE NOTE:  Being apprised of risks of anesthesia, infection,  bleeding, injury to abdominal organs, need for repair, delayed versus  immediate complications to include bowel and bladder injury, the patient  was brought to the operating room where she was administered general  anesthetic without complications, prepped and draped in the usual  sterile fashion.  A Foley catheter was placed.  After achieving adequate  anesthesia, diluted Marcaine solution was placed.  Infraumbilical  incision made with a scalpel.  Veress needle placed with an opening  pressure -2 noted 5 liters CO2 insufflated without difficulty.  Trocar  was placed.  Atraumatic trocar entry was noted. At this time  visualization reveals normal liver gallbladder bed, normal appendiceal  area, normal size uterus, normal right ovary, normal left ovary, and a  large 10 cm left paratubal cyst, which was confluent with the left tube.  At this time two 5 mm ports were placed in the left lower quadrant  midline atraumatically under direct visualization and the left  paratubal  cyst, which had appeared simple on ultrasound.  It was aspirated for  about 250 mL of clear fluid.  At this time after being able to identify  the cyst, the gyrus was entered through the left lower quadrant and the  left tube is excised along the mesosalpinx.  The specimen was detached  and placed in EndoCatch and removed through the umbilical port without  difficulty.  Good hemostasis was noted.  Right ovary appears intact and  well perfused.  At this time, a right paratubal cyst was noted and was  excised at the base using the gyrus and removed intact.  At this time,  good hemostasis was noted.  Irrigation accomplished.  Two normal ovaries  were appreciated.  Normal anterior  posterior cul-de-sac.  All instruments were removed under direct  visualization.  CO2 was released.  Incisions closed using 0 Vicryl and  Dermabond.  The patient tolerated the procedure well and was transferred  to the recovery room in good condition.      Lenoard Aden, M.D.  Electronically Signed     RJT/MEDQ  D:  03/12/2008  T:  03/13/2008  Job:  [id number]

## 2011-01-10 NOTE — H&P (Signed)
NAMECYANN, VENTI          ACCOUNT NO.:  [id number]   MEDICAL RECORD NO.:  [id number]          PATIENT TYPE:  AMB   LOCATION:  SDC                           [facility]   PHYSICIAN:  Lenoard Aden, M.D.DATE OF BIRTH:  [birth year]   DATE OF ADMISSION:  DATE OF DISCHARGE:                              HISTORY & PHYSICAL   CHIEF COMPLAINT:  Pelvic pain with adnexal mass.   HISTORY OF PRESENT ILLNESS:  The patient is a 35 year old white female  G6, P3 with a history of vaginal delivery x3 who presents with a two  week history of worsening pelvic pain and an ultrasound which reveals a  9 cm simple adnexal mass consistent with either a large paratubal cyst  or a large right ovarian cyst.  She is currently on pain medication for  pain alleviation but proceeds for urgent surgery for pain relief.   MEDICATIONS:  Her medications include Phenergan, Percocet as needed.   ALLERGIES:  She has no known drug allergies.   FAMILY HISTORY:  She has a family history of heart disease, diabetes,  stroke, breast cancer, bipolar disorder.   PAST MEDICAL HISTORY:  She has a personal history of depression, thyroid  disease and childbirth as noted.   PHYSICAL EXAMINATION:  GENERAL:  She is an obese white female in no  acute distress.  HEENT:  Normal.  LUNGS:  Clear.  HEART:  Regular rate and rhythm.  ABDOMEN:  Soft.  Tenderness to palpation in the left lower quadrant.  PELVIC:  On pelvic exam the uterus is normal in size, shape and contour  and there is direct tenderness to palpation in the left adnexa.  Ultrasound performed on March 11, 2008 consists of a 9+ cm simple left  adnexal mass and apparently normal appearing left and right ovaries with  normal sized uterus.   IMPRESSION:  Large symptomatic adnexal mass, most likely benign by  characteristics and nature.   PLAN:  The plan is to proceed with diagnostic laparoscopy, probably  cystectomy, possible unilateral  salpingo-oophorectomy.  The risks of  anesthesia, infection, bleeding, injury to abdominal organs, need for  repair is discussed, delayed or immediate complications to include bowel  and bladder injury noted.  Will proceed laparoscopically, possible need  for laparotomy noted.      Lenoard Aden, M.D.  Electronically Signed     RJT/MEDQ  D:  03/12/2008  T:  03/12/2008  Job:  [id number]

## 2011-01-10 NOTE — H&P (Signed)
NAMEANTONISHA, [name]          ACCOUNT NO.:  [id number]   MEDICAL RECORD NO.:  [id number]          PATIENT TYPE:  MAT   LOCATION:  MATC                          [facility]  [facility]   PHYSICIAN:  [place] B. Earlene Plater, M.D.  DATE OF BIRTH:  03-Jun-1976   DATE OF ADMISSION:  03/08/2008  DATE OF DISCHARGE:  03/08/2008                              HISTORY & PHYSICAL   CHIEF COMPLAINT:  Left lower quadrant pain.   HISTORY OF PRESENT ILLNESS:  A 35 year old white female presents with 1-  month history of left lower quadrant pain which has been getting worse  over the last few days.  She has no fever, nausea, or vomiting.  She has  chronic constipation but has not been worse recently.  No relief of pain  with bowel movements.  No worsening of pain.  Constipation is more  prevalent.   PAST MEDICAL HISTORY:  None.   SURGICAL HISTORY:  None.   MEDICATIONS:  None.   ALLERGIES:  None.   SOCIAL HISTORY:  Noncontributory.   PHYSICAL EXAMINATION:  VITAL SIGNS:  The patient is afebrile with stable  vital signs.  GENERAL:  The patient is alert and oriented, in no acute distress.  SKIN:  Warm, dry, no lesions.  ABDOMEN:  Soft.  She has mild lower abdominal tenderness without acute  signs.  PELVIC:  Normal external genitalia.  Vagina and cervix are normal.  Uterus and adnexa cannot be palpated due to the patient's obesity.  However, no specific areas of tenderness are noted.   LABORATORY DATA:  Urine pregnancy is negative.  Urinalysis is normal.  Complete blood count within normal limits.  Ultrasound showed a 9.5-cm  simple left ovarian cyst versus paraovarian cyst.  No evidence for  torsion on color flow Doppler.   ASSESSMENT:  Left lower quadrant pain, left ovarian or paraovarian cyst.   PLAN:  The patient is discharged to home with prescription for Darvocet-  N 100 one to two p.o. q.4. h. p.r.n. pain.  Follow up in 1 week [facility].  The patient was instructed she will need a followup  ultrasound  approximately 4-6 weeks to document resolution of the above-noted cyst.      Gerri Spore B. Earlene Plater, M.D.  Electronically Signed     WBD/MEDQ  D:  03/09/2008  T:  03/09/2008  Job:  [id number]

## 2011-01-13 NOTE — H&P (Signed)
NAMEFAY, BAGG               ACCOUNT NO.:  [id number]   MEDICAL RECORD NO.:  [id number]          PATIENT TYPE:  INP   LOCATION:  9109                          [facility]  [facility]   PHYSICIAN:  Lenoard Aden, M.D.DATE OF BIRTH:  1976/01/06   DATE OF ADMISSION:  10/31/2005  DATE OF DISCHARGE:                                HISTORY & PHYSICAL   CHIEF COMPLAINT:  Elective induction.   HISTORY OF PRESENT ILLNESS:  The patient is a 35 year old white female, G6,  P1-1-3-2, at [redacted] weeks gestation, with a favorable cervix and request for  induction due to social circumstances.   ALLERGIES:  No known drug allergies.   MEDICATIONS:  Prenatal vitamins.   PAST OBSTETRICAL HISTORY:  She has a history of 2 spontaneous vaginal  deliveries, 2 abortions, and 1 pregnancy loss.  History of preterm premature  rupture of membranes.   PAST MEDICAL HISTORY:  She has a previous of depression, previously on  Zoloft.   FAMILY HISTORY:  Diabetes, heart disease.   PRENATAL LABORATORY DATA:  Blood type A positive.  Rubella indeterminate.  HIV and hepatitis negative.  GBS negative.   PHYSICAL EXAMINATION:  GENERAL:  Well-developed, well-nourished, white  female in no acute distress.  HEENT:  Normal.  LUNGS:  Clear.  HEART:  Regular rhythm.  ABDOMEN:  Soft, gravid, nontender.  Estimated fetal weight 8 pounds.  PELVIC:  Cervix is 2 cm, thick, vertex -1.  EXTREMITIES:  No cords.  NEUROLOGIC:  Nonfocal.   IMPRESSION:  1.  A 38 week intrauterine pregnancy.  2.  Poor social circumstances.  3.  For an elective induction with favorable cervix.   PLAN:  Proceed with induction.  Risks and benefits discussed.      Lenoard Aden, M.D.  Electronically Signed     RJT/MEDQ  D:  10/31/2005  T:  10/31/2005  Job:  [id number]

## 2011-01-13 NOTE — H&P (Signed)
NAMEMARJAN, [name]               ACCOUNT NO.:  [id number]   MEDICAL RECORD NO.:  [id number]          PATIENT TYPE:  AMB   LOCATION:  SDC                           [facility]   PHYSICIAN:  Roseanna Rainbow, M.D.DATE OF BIRTH:  [birth year]   DATE OF ADMISSION:  DATE OF DISCHARGE:                                HISTORY & PHYSICAL   CHIEF COMPLAINT:  The patient is a 35 year old Caucasian with a Mirena IUD  in situ, now for operative hysteroscopy with removal of the IUD.   HISTORY OF PRESENT ILLNESS:  As above.  The patient had the IUD placed  several months ago.  However the patient experienced irregular bleeding and  requested the IUD be removed.  On exam in the office, the IUD strings were  not well visualized.  Two attempts were made to remove the IUD in the  office.  An ultrasound was consistent with the IUD being in situ.   PAST OBSTETRICAL AND GYNECOLOGICAL HISTORY:  She has been pregnant six  times.  There have been two live births, one miscarriage, three abortions.   PAST MEDICAL HISTORY:  Remarkable for anxiety disorder and depression.   PAST SURGICAL HISTORY:  She denies.   FAMILY HISTORY:  Noncontributory.   ALLERGIES:  No known drug allergies.   SOCIAL HISTORY:  She is divorced and she is employed as a Teacher, adult education.  She denies any tobacco, ethanol, or substance abuse.   PHYSICAL EXAMINATION:  VITAL SIGNS:  Stable, afebrile.  GENERAL:  Well-developed, well-nourished Caucasian female in no apparent  distress.  LUNGS:  Clear to auscultation bilaterally.  HEART:  Regular rate and rhythm.  ABDOMEN:  Soft, nontender, no organomegaly.  PELVIC:  Normal EG/BUS.  On speculum exam, the vagina is clean.  On bimanual  exam, the uterus is anteverted, normal size, nontender.  The adnexa are  nonpalpable, nontender.   ASSESSMENT:  Mirena IUD in situ status post failed attempts in the office to  remove.   PLAN:  The planned procedure is hysteroscopy with  removal of the IUD under  direct visualization.  The risks, benefits, and alternative forms of  management were reviewed with the patient and informed consent had been  obtained.     Collier Flowers  D:  08/16/2004  T:  08/16/2004  Job:  [id number]

## 2011-01-13 NOTE — Consult Note (Signed)
NAMEMURANDA, COYE               ACCOUNT NO.:  [id number]   MEDICAL RECORD NO.:  [id number]          PATIENT TYPE:  MAT   LOCATION:  MATC                          [facility]   PHYSICIAN:  Lenoard Aden, M.D.DATE OF BIRTH:  [birth year]   DATE OF CONSULTATION:  DATE OF DISCHARGE:                                   CONSULTATION   CONSULTING PHYSICIAN:  Lenoard Aden, M.D.   CHIEF COMPLAINT:  Low back pain, discharge history of preterm birth.   She is a 35 year old white female with a history of preterm delivery who  presents with aforementioned symptomatology.  Her history is remarkable for  a spontaneous vaginal delivery.  She is a nonsmoker, nondrinker.  Denies  domestic or physical violence.   PAST MEDICAL HISTORY:  Otherwise is uncomplicated.  She is a G5, P1-1-2-2.   She has no known drug allergies.   MEDICATIONS:  Prenatal vitamins.   PHYSICAL EXAMINATION:  GENERAL:  Shows a well-developed, well-nourished,  white female in no acute distress.  HEENT:  Normal.  LUNGS:  Clear.  HEART:  Regular rhythm.  ABDOMEN:  Soft, gravid, nontender.  PELVIC:  Cervix is closed, flared, 3-cm long, presenting part of the pelvis.  EXTREMITIES:  Revealed no cords.  NEUROLOGIC:  Nonfocal.   Wet prep negative.  Urinalysis negative.  Monitoring reveals fetal heart  tones 120 to 140 beats per minute.  No contractions noted.   IMPRESSION:  Low back pain, probable musculoskeletal.  No evidence of  preterm cervical change.   PLAN:  1.  Follow up in the office p.r.n.  2.  Reassurance given.      Lenoard Aden, M.D.  Electronically Signed     RJT/MEDQ  D:  07/28/2005  T:  07/28/2005  Job:  [id number]   cc:   Lenoard Aden, M.D.  Fax: [phone]

## 2011-01-13 NOTE — Op Note (Signed)
NAMEJULLIANA, [name]               ACCOUNT NO.:  [id number]   MEDICAL RECORD NO.:  [id number]          PATIENT TYPE:  AMB   LOCATION:  SDC                           [facility]   PHYSICIAN:  Roseanna Rainbow, M.D.DATE OF BIRTH:  28-Jan-1976   DATE OF PROCEDURE:  08/18/2004  DATE OF DISCHARGE:                                 OPERATIVE REPORT   PREOPERATIVE DIAGNOSIS:  Mirena intrauterine device in situ.   POSTOPERATIVE DIAGNOSIS:  Mirena intrauterine device in situ.   PROCEDURE:  Operative hysteroscopy with removal of intrauterine device.   SURGEON:  Roseanna Rainbow, M.D.   ANESTHESIA:  Managed anesthesia care, paracervical block.   COMPLICATIONS:  None.   ESTIMATED BLOOD LOSS:  50 mL.   PROCEDURE:  The patient was taken to the operating room.  She was placed in  the dorsal lithotomy position and prepped and draped in the usual sterile  fashion.  Sims retractors were placed into the vagina.  The anterior lip of  the cervix was injected with 2 mL of 1% lidocaine.  A single-tooth tenaculum  was then applied to this location.  Lidocaine 1% 4 mL were then injected at  5 and 7 o'clock to produce a paracervical block.  The cervix was then  dilated with [facility] dilators.  The operative hysteroscope was then introduced  into the cervical canal and then into the uterus.  The IUD was visualized  and under direct visualization, it was grasped with a grasper and removed.  The single-tooth tenaculum was then removed with minimal bleeding noted.  Sorbitol was the distending medium used, and there was a minimal deficit.  At the close of the procedure, the instrument and pad counts were said to be  correct x2.  The patient was taken to the PACU awake and in stable  condition.     Collier Flowers  D:  08/18/2004  T:  08/18/2004  Job:  [id number]

## 2011-01-13 NOTE — Consult Note (Signed)
[name], Humphrey NO.:  [id number]   MEDICAL RECORD NO.:  [id number]           PATIENT TYPE:   LOCATION:                                 FACILITY:   PHYSICIAN:  Lenoard Aden, M.D.     DATE OF BIRTH:   DATE OF CONSULTATION:  06/07/2005  DATE OF DISCHARGE:                                   CONSULTATION   This is a 35 year old white female, gravida 6, para 2, at 17-6/[redacted] weeks  gestation with lower abdominal pain.  She has a history of two uncomplicated  vaginal deliveries and two uncomplicated abortions, one uncomplicated  miscarriage.  She has a history of two previous vaginal deliveries. She has  had uncomplicated pregnancy to date.   PHYSICAL EXAMINATION:  GENERAL:  She is a comfortable appearing white female  in no acute distress.  VITAL SIGNS:  Temperature 98, pulse 105, respirations 20, blood pressure  122/72.  HEENT:  Normal.  LUNGS:  Clear.  HEART:  Regular rate and rhythm.  ABDOMEN:  Soft, gravid, and nontender.  Fetal heart tones are not heard by  Doppler, therefore, verified by ultrasound which reveals a normal appearing  placenta, normal 18-week intrauterine pregnancy, and a transabdominal  cervical length of 5 cm.  Urinalysis is negative.  EXTREMITIES:  No cords.  NEUROLOGY:  Nonfocal.   IMPRESSION:  Lower abdominal cramping, probable musculoskeletal pain.   PLAN:  Discharge home.  Follow up in the office in 1 week as scheduled.  Work note given.  Threatened abortion precautions noted.      Lenoard Aden, M.D.  Electronically Signed     RJT/MEDQ  D:  06/07/2005  T:  06/07/2005  Job:  [id number]

## 2011-04-25 ENCOUNTER — Other Ambulatory Visit (HOSPITAL_COMMUNITY): Payer: Self-pay | Admitting: Endocrinology

## 2011-04-25 DIAGNOSIS — C73 Malignant neoplasm of thyroid gland: Secondary | ICD-10-CM

## 2011-05-25 LAB — URINALYSIS, ROUTINE W REFLEX MICROSCOPIC
Bilirubin Urine: NEGATIVE
Glucose, UA: NEGATIVE
Ketones, ur: NEGATIVE
Leukocytes, UA: NEGATIVE
Nitrite: NEGATIVE
Protein, ur: NEGATIVE
Specific Gravity, Urine: 1.025
Urobilinogen, UA: 0.2
pH: 5

## 2011-05-25 LAB — CBC
HCT: 39.7
Hemoglobin: 13.1
MCHC: 33
MCV: 81.9
Platelets: 416 — ABNORMAL HIGH
RBC: 4.84
RDW: 13.5
WBC: 12.3 — ABNORMAL HIGH

## 2011-05-25 LAB — DIFFERENTIAL
Basophils Absolute: 0
Basophils Relative: 0
Eosinophils Absolute: 0.1
Eosinophils Relative: 1
Lymphocytes Relative: 30
Lymphs Abs: 3.5
Monocytes Absolute: 0.7
Monocytes Relative: 6
Neutro Abs: 7.5
Neutrophils Relative %: 63

## 2011-05-25 LAB — URINE MICROSCOPIC-ADD ON

## 2011-05-25 LAB — POCT PREGNANCY, URINE
Operator id: 28886
Preg Test, Ur: NEGATIVE

## 2011-05-26 LAB — CBC
HCT: 42.6
Hemoglobin: 14.2
MCHC: 33.2
MCV: 82
Platelets: 404 — ABNORMAL HIGH
RBC: 5.19 — ABNORMAL HIGH
RDW: 14.1
WBC: 8.3

## 2011-05-26 LAB — HCG, SERUM, QUALITATIVE: Preg, Serum: NEGATIVE

## 2011-06-05 ENCOUNTER — Ambulatory Visit (HOSPITAL_COMMUNITY)
Admission: RE | Admit: 2011-06-05 | Discharge: 2011-06-05 | Disposition: A | Discharge status: Home / Self Care | Payer: Managed Care, Other (non HMO) | Source: Ambulatory Visit | Attending: Endocrinology | Admitting: Endocrinology

## 2011-06-05 DIAGNOSIS — C73 Malignant neoplasm of thyroid gland: Secondary | ICD-10-CM | POA: Insufficient documentation

## 2011-06-06 ENCOUNTER — Encounter (HOSPITAL_COMMUNITY)
Admission: RE | Admit: 2011-06-06 | Discharge: 2011-06-06 | Disposition: A | Discharge status: Home / Self Care | Payer: Managed Care, Other (non HMO) | Source: Ambulatory Visit | Attending: Endocrinology | Admitting: Endocrinology

## 2011-06-06 DIAGNOSIS — C73 Malignant neoplasm of thyroid gland: Secondary | ICD-10-CM | POA: Insufficient documentation

## 2011-06-07 ENCOUNTER — Encounter (HOSPITAL_COMMUNITY)
Admission: RE | Admit: 2011-06-07 | Discharge: 2011-06-07 | Disposition: A | Discharge status: Home / Self Care | Payer: Managed Care, Other (non HMO) | Source: Ambulatory Visit | Attending: Endocrinology | Admitting: Endocrinology

## 2011-06-07 DIAGNOSIS — C73 Malignant neoplasm of thyroid gland: Secondary | ICD-10-CM | POA: Insufficient documentation

## 2011-06-07 LAB — HCG, SERUM, QUALITATIVE: Preg, Serum: NEGATIVE

## 2011-06-09 ENCOUNTER — Encounter (HOSPITAL_COMMUNITY)
Admission: RE | Admit: 2011-06-09 | Discharge: 2011-06-09 | Disposition: A | Discharge status: Home / Self Care | Payer: Managed Care, Other (non HMO) | Source: Ambulatory Visit | Attending: Endocrinology | Admitting: Endocrinology

## 2011-06-09 DIAGNOSIS — C73 Malignant neoplasm of thyroid gland: Secondary | ICD-10-CM | POA: Insufficient documentation

## 2011-06-09 MED ORDER — SODIUM IODIDE I 131 CAPSULE
4.1000 | Freq: Once | INTRAVENOUS | Status: AC | PRN
  Administered 2011-06-09: 4.1 via ORAL

## 2011-08-07 ENCOUNTER — Other Ambulatory Visit (HOSPITAL_COMMUNITY): Payer: Self-pay | Admitting: Family Medicine

## 2011-08-07 DIAGNOSIS — K219 Gastro-esophageal reflux disease without esophagitis: Secondary | ICD-10-CM

## 2011-08-07 DIAGNOSIS — R109 Unspecified abdominal pain: Secondary | ICD-10-CM

## 2011-08-10 ENCOUNTER — Other Ambulatory Visit (HOSPITAL_COMMUNITY): Payer: Self-pay | Admitting: Family Medicine

## 2011-08-10 ENCOUNTER — Ambulatory Visit (HOSPITAL_COMMUNITY)
Admission: RE | Admit: 2011-08-10 | Discharge: 2011-08-10 | Disposition: A | Discharge status: Home / Self Care | Payer: Managed Care, Other (non HMO) | Source: Ambulatory Visit | Attending: Family Medicine | Admitting: Family Medicine

## 2011-08-10 DIAGNOSIS — R109 Unspecified abdominal pain: Secondary | ICD-10-CM | POA: Insufficient documentation

## 2011-08-10 DIAGNOSIS — K219 Gastro-esophageal reflux disease without esophagitis: Secondary | ICD-10-CM

## 2011-08-10 DIAGNOSIS — K7689 Other specified diseases of liver: Secondary | ICD-10-CM | POA: Insufficient documentation

## 2011-08-22 ENCOUNTER — Encounter: Payer: Self-pay | Admitting: *Deleted

## 2011-08-22 ENCOUNTER — Other Ambulatory Visit: Payer: Self-pay

## 2011-08-22 ENCOUNTER — Emergency Department (HOSPITAL_COMMUNITY)
Admission: EM | Admit: 2011-08-22 | Discharge: 2011-08-23 | Disposition: A | Discharge status: Home / Self Care | Payer: Managed Care, Other (non HMO) | Attending: Emergency Medicine | Admitting: Emergency Medicine

## 2011-08-22 ENCOUNTER — Emergency Department (HOSPITAL_COMMUNITY): Payer: Managed Care, Other (non HMO)

## 2011-08-22 DIAGNOSIS — M79609 Pain in unspecified limb: Secondary | ICD-10-CM | POA: Insufficient documentation

## 2011-08-22 DIAGNOSIS — R0789 Other chest pain: Secondary | ICD-10-CM | POA: Insufficient documentation

## 2011-08-22 DIAGNOSIS — E669 Obesity, unspecified: Secondary | ICD-10-CM | POA: Insufficient documentation

## 2011-08-22 HISTORY — DX: Disorder of thyroid, unspecified: E07.9

## 2011-08-22 LAB — CBC
HCT: 38.2 % (ref 36.0–46.0)
Hemoglobin: 12.5 g/dL (ref 12.0–15.0)
MCH: 26.8 pg (ref 26.0–34.0)
MCHC: 32.7 g/dL (ref 30.0–36.0)
MCV: 82 fL (ref 78.0–100.0)
Platelets: 372 10*3/uL (ref 150–400)
RBC: 4.66 MIL/uL (ref 3.87–5.11)
RDW: 15.3 % (ref 11.5–15.5)
WBC: 8.4 10*3/uL (ref 4.0–10.5)

## 2011-08-22 LAB — DIFFERENTIAL
Basophils Absolute: 0 10*3/uL (ref 0.0–0.1)
Basophils Relative: 0 % (ref 0–1)
Eosinophils Absolute: 0.1 10*3/uL (ref 0.0–0.7)
Eosinophils Relative: 1 % (ref 0–5)
Lymphocytes Relative: 38 % (ref 12–46)
Lymphs Abs: 3.2 10*3/uL (ref 0.7–4.0)
Monocytes Absolute: 0.6 10*3/uL (ref 0.1–1.0)
Monocytes Relative: 7 % (ref 3–12)
Neutro Abs: 4.5 10*3/uL (ref 1.7–7.7)
Neutrophils Relative %: 53 % (ref 43–77)

## 2011-08-22 LAB — BASIC METABOLIC PANEL
BUN: 13 mg/dL (ref 6–23)
CO2: 25 mEq/L (ref 19–32)
Calcium: 9.4 mg/dL (ref 8.4–10.5)
Chloride: 103 mEq/L (ref 96–112)
Creatinine, Ser: 0.7 mg/dL (ref 0.50–1.10)
GFR calc Af Amer: >90 mL/min (ref >90)
GFR calc non Af Amer: >90 mL/min (ref >90)
Glucose, Bld: 96 mg/dL (ref 70–99)
Potassium: 3.8 mEq/L (ref 3.5–5.1)
Sodium: 139 mEq/L (ref 135–145)

## 2011-08-22 LAB — D-DIMER, QUANTITATIVE: D-Dimer, Quant: 0.26 ug/mL-FEU (ref 0.00–0.48)

## 2011-08-22 LAB — POCT I-STAT TROPONIN I: Troponin i, poc: 0 ng/mL (ref 0.00–0.08)

## 2011-08-22 MED ORDER — KETOROLAC TROMETHAMINE 30 MG/ML IJ SOLN
30.0000 mg | Freq: Once | INTRAMUSCULAR | Status: AC
  Administered 2011-08-23: 30 mg via INTRAVENOUS
  Filled 2011-08-22: qty 1

## 2011-08-22 MED ORDER — ASPIRIN 81 MG PO CHEW
324.0000 mg | CHEWABLE_TABLET | Freq: Once | ORAL | Status: AC
  Administered 2011-08-22: 324 mg via ORAL
  Filled 2011-08-22: qty 4

## 2011-08-22 MED ORDER — PANTOPRAZOLE SODIUM 40 MG IV SOLR
40.0000 mg | Freq: Once | INTRAVENOUS | Status: AC
  Administered 2011-08-22: 40 mg via INTRAVENOUS
  Filled 2011-08-22: qty 40

## 2011-08-22 NOTE — ED Provider Notes (Signed)
History     CSN: [id number]  Arrival date & time 08/22/11  2138   First MD Initiated Contact with Patient 08/22/11 2159      Chief Complaint  Patient presents with  . Chest Pain    Bil arm pain, back pain    (Consider location/radiation/quality/duration/timing/severity/associated sxs/prior treatment) Patient is a 35 y.o. female presenting with chest pain. The history is provided by the patient. No language interpreter was used.  Chest Pain The chest pain began 3 - 5 hours ago. Chest pain occurs intermittently. The chest pain is resolved. Associated with: laughing. The severity of the pain is moderate. The quality of the pain is described as aching. The pain radiates to the right arm, left arm and right jaw. Exacerbated by: laughing. Pertinent negatives for primary symptoms include no fever, no fatigue, no shortness of breath, no cough, no palpitations, no abdominal pain, no nausea, no vomiting and no dizziness.  Pertinent negatives for associated symptoms include no diaphoresis, no near-syncope, no numbness, no orthopnea, no paroxysmal nocturnal dyspnea and no weakness. She tried nothing for the symptoms. Risk factors include obesity.  Her family medical history is significant for CAD in family and heart disease in family.     Past Medical History  Diagnosis Date  . Thyroid disease     History reviewed. No pertinent past surgical history.  No family history on file.  History  Substance Use Topics  . Smoking status: Former Games developer  . Smokeless tobacco: Not on file  . Alcohol Use: Yes     none today    OB History    Grav Para Term Preterm Abortions TAB SAB Ect Mult Living                  Review of Systems  Constitutional: Negative for fever, diaphoresis, activity change, appetite change and fatigue.  HENT: Negative for congestion, sore throat, rhinorrhea, neck pain and neck stiffness.   Respiratory: Negative for cough and shortness of breath.   Cardiovascular:  Positive for chest pain. Negative for palpitations, orthopnea and near-syncope.  Gastrointestinal: Negative for nausea, vomiting and abdominal pain.  Genitourinary: Negative for dysuria, urgency, frequency and flank pain.  Neurological: Negative for dizziness, weakness, light-headedness, numbness and headaches.  All other systems reviewed and are negative.    Allergies  Review of patient's allergies indicates no known allergies.  Home Medications   Current Outpatient Rx  Name Route Sig Dispense Refill  . ALPRAZOLAM 1 MG PO TABS Oral Take 1 mg by mouth 3 (three) times daily as needed. For anxiety     . ASPIRIN-ACETAMINOPHEN-CAFFEINE 250-250-65 MG PO TABS Oral Take 1 tablet by mouth every 6 (six) hours as needed. For pain     . GOODY HEADACHE PO Oral Take 1 Package by mouth daily as needed. For headache     . LEVOTHYROXINE SODIUM 125 MCG PO TABS Oral Take 125 mcg by mouth 2 (two) times daily.      Marland Kitchen PANTOPRAZOLE SODIUM 40 MG PO TBEC Oral Take 40 mg by mouth daily as needed. For indigestion     . FAMOTIDINE 20 MG PO TABS Oral Take 1 tablet (20 mg total) by mouth 2 (two) times daily. 30 tablet 0    BP 144/81  Pulse 88  Temp(Src) 98.3 F (36.8 C) (Oral)  Resp 18  SpO2 99%  LMP 08/22/2011  Physical Exam  Nursing note and vitals reviewed. Constitutional: She is oriented to person, place, and time. She appears well-developed and  well-nourished. No distress.  HENT:  Head: Normocephalic and atraumatic.  Mouth/Throat: Oropharynx is clear and moist. No oropharyngeal exudate.  Eyes: Conjunctivae and EOM are normal. Pupils are equal, round, and reactive to light.  Neck: Normal range of motion. Neck supple.  Cardiovascular: Normal rate, regular rhythm, normal heart sounds and intact distal pulses.  Exam reveals no gallop and no friction rub.   No murmur heard. Pulmonary/Chest: Effort normal and breath sounds normal. No respiratory distress. She exhibits no tenderness.  Abdominal: Soft.  There is no tenderness.  Musculoskeletal: Normal range of motion. She exhibits no tenderness.  Neurological: She is alert and oriented to person, place, and time. No cranial nerve deficit.  Skin: Skin is warm and dry.    ED Course  Procedures (including critical care time)   Date: 08/22/2011  Rate: 89  Rhythm: normal sinus rhythm  QRS Axis: normal  Intervals: normal  ST/T Wave abnormalities: normal  Conduction Disutrbances:none  Narrative Interpretation:   Old EKG Reviewed: none available   Labs Reviewed  CBC  DIFFERENTIAL  BASIC METABOLIC PANEL  D-DIMER, QUANTITATIVE  POCT I-STAT TROPONIN I  I-STAT TROPONIN I   Dg Chest 2 View  08/22/2011  *RADIOLOGY REPORT*  Clinical Data: Chest pain radiating to both arms; cough.  History of smoking.  CHEST - 2 VIEW  Comparison: Chest radiograph performed 03/03/2009  Findings: The lungs are well-aerated.  Mild focal left basilar opacity is not well characterized on the lateral view and may reflect atelectasis.  There is no evidence of pleural effusion or pneumothorax.  The heart is normal in size; the mediastinal contour is within normal limits.  No acute osseous abnormalities are seen.  IMPRESSION: Mild focal left basilar airspace opacity is not well characterized on the lateral view, and may reflect atelectasis.  Original Report Authenticated By: Tonia Ghent, M.D.     1. Chest pain, atypical       MDM  Atypical chest pain. I have very low suspicion for cardiac etiology. EKG is normal. Her symptoms were persistent for 6 hours and a dull level therefore single troponin adequately rules out ACS. She has adequate followup with her primary care physician. D-dimer was negative thus ruling out pulmonary embolus. I feel this is likely secondary to anxiety or GERD both of which diagnosis she has and is currently being treated for. I added Pepcid to her regimen for GERD encouraged her to use her anxiety medication as needed.        Dayton Bailiff, MD 08/23/11 [phone]

## 2011-08-22 NOTE — ED Notes (Signed)
PT REports today at noon while laughing felt mid CP that radiated to back and bil arms. PT denies any cardiac HX but father had MI at age 33.

## 2011-08-23 ENCOUNTER — Encounter (HOSPITAL_COMMUNITY)
Admission: RE | Admit: 2011-08-23 | Discharge: 2011-08-23 | Disposition: A | Discharge status: Home / Self Care | Payer: Managed Care, Other (non HMO) | Source: Ambulatory Visit | Attending: Family Medicine | Admitting: Family Medicine

## 2011-08-23 DIAGNOSIS — R109 Unspecified abdominal pain: Secondary | ICD-10-CM | POA: Insufficient documentation

## 2011-08-23 MED ORDER — FAMOTIDINE 20 MG PO TABS: 20.0000 mg | ORAL_TABLET | Freq: Two times a day (BID) | ORAL | Status: DC

## 2011-08-23 MED ORDER — TECHNETIUM TC 99M MEBROFENIN IV KIT
5.6000 | PACK | Freq: Once | INTRAVENOUS | Status: AC | PRN
  Administered 2011-08-23: 6 via INTRAVENOUS

## 2011-09-28 ENCOUNTER — Encounter (HOSPITAL_COMMUNITY): Payer: Self-pay | Admitting: *Deleted

## 2011-10-02 ENCOUNTER — Encounter (HOSPITAL_COMMUNITY): Payer: Self-pay | Admitting: Pharmacist

## 2011-10-12 ENCOUNTER — Other Ambulatory Visit: Payer: Self-pay | Admitting: Obstetrics and Gynecology

## 2011-10-12 ENCOUNTER — Encounter (HOSPITAL_COMMUNITY): Payer: Self-pay | Admitting: *Deleted

## 2011-10-12 ENCOUNTER — Ambulatory Visit (HOSPITAL_COMMUNITY): Payer: Managed Care, Other (non HMO) | Admitting: Anesthesiology

## 2011-10-12 ENCOUNTER — Encounter (HOSPITAL_COMMUNITY): Payer: Self-pay | Admitting: Anesthesiology

## 2011-10-12 ENCOUNTER — Encounter (HOSPITAL_COMMUNITY)
Admission: RE | Disposition: A | Discharge status: Home / Self Care | Payer: Self-pay | Source: Ambulatory Visit | Attending: Obstetrics and Gynecology

## 2011-10-12 ENCOUNTER — Ambulatory Visit (HOSPITAL_COMMUNITY)
Admission: RE | Admit: 2011-10-12 | Discharge: 2011-10-12 | Disposition: A | Discharge status: Home / Self Care | Payer: Managed Care, Other (non HMO) | Source: Ambulatory Visit | Attending: Obstetrics and Gynecology | Admitting: Obstetrics and Gynecology

## 2011-10-12 DIAGNOSIS — N949 Unspecified condition associated with female genital organs and menstrual cycle: Secondary | ICD-10-CM | POA: Insufficient documentation

## 2011-10-12 DIAGNOSIS — N938 Other specified abnormal uterine and vaginal bleeding: Secondary | ICD-10-CM | POA: Insufficient documentation

## 2011-10-12 DIAGNOSIS — N840 Polyp of corpus uteri: Secondary | ICD-10-CM | POA: Insufficient documentation

## 2011-10-12 DIAGNOSIS — N8000 Endometriosis of the uterus, unspecified: Secondary | ICD-10-CM | POA: Insufficient documentation

## 2011-10-12 DIAGNOSIS — K5900 Constipation, unspecified: Secondary | ICD-10-CM

## 2011-10-12 DIAGNOSIS — N925 Other specified irregular menstruation: Secondary | ICD-10-CM | POA: Insufficient documentation

## 2011-10-12 DIAGNOSIS — E282 Polycystic ovarian syndrome: Secondary | ICD-10-CM | POA: Insufficient documentation

## 2011-10-12 HISTORY — DX: Nausea with vomiting, unspecified: R11.2

## 2011-10-12 HISTORY — DX: Gastro-esophageal reflux disease without esophagitis: K21.9

## 2011-10-12 HISTORY — DX: Anxiety disorder, unspecified: F41.9

## 2011-10-12 HISTORY — DX: Hypothyroidism, unspecified: E03.9

## 2011-10-12 HISTORY — DX: Headache: R51

## 2011-10-12 HISTORY — PX: DILATION AND CURETTAGE OF UTERUS: SHX78

## 2011-10-12 HISTORY — DX: Other specified postprocedural states: Z98.890

## 2011-10-12 HISTORY — DX: Malignant (primary) neoplasm, unspecified: C80.1

## 2011-10-12 LAB — CBC
HCT: 40.1 % (ref 36.0–46.0)
Hemoglobin: 12.6 g/dL (ref 12.0–15.0)
MCH: 26.3 pg (ref 26.0–34.0)
MCHC: 31.4 g/dL (ref 30.0–36.0)
MCV: 83.7 fL (ref 78.0–100.0)
Platelets: 402 10*3/uL — ABNORMAL HIGH (ref 150–400)
RBC: 4.79 MIL/uL (ref 3.87–5.11)
RDW: 14.2 % (ref 11.5–15.5)
WBC: 7.9 10*3/uL (ref 4.0–10.5)

## 2011-10-12 SURGERY — DILATION AND CURETTAGE
Anesthesia: Monitor Anesthesia Care

## 2011-10-12 MED ORDER — FENTANYL CITRATE 0.05 MG/ML IJ SOLN
INTRAMUSCULAR | Status: DC | PRN
  Administered 2011-10-12: 50 ug via INTRAVENOUS
  Administered 2011-10-12: 100 ug via INTRAVENOUS

## 2011-10-12 MED ORDER — PROPOFOL 10 MG/ML IV EMUL
INTRAVENOUS | Status: DC | PRN
  Administered 2011-10-12: 20 mL via INTRAVENOUS
  Administered 2011-10-12: 60 mL via INTRAVENOUS

## 2011-10-12 MED ORDER — CEFAZOLIN SODIUM 1-5 GM-% IV SOLN
INTRAVENOUS | Status: AC
  Administered 2011-10-12: 1 g via INTRAVENOUS
  Filled 2011-10-12: qty 50

## 2011-10-12 MED ORDER — CEFAZOLIN SODIUM 1-5 GM-% IV SOLN
INTRAVENOUS | Status: AC
  Filled 2011-10-12: qty 50

## 2011-10-12 MED ORDER — LACTATED RINGERS IV SOLN: INTRAVENOUS | Status: DC

## 2011-10-12 MED ORDER — ONDANSETRON HCL 4 MG/2ML IJ SOLN
INTRAMUSCULAR | Status: DC | PRN
  Administered 2011-10-12: 4 mg via INTRAVENOUS

## 2011-10-12 MED ORDER — MIDAZOLAM HCL 2 MG/2ML IJ SOLN
INTRAMUSCULAR | Status: AC
  Filled 2011-10-12: qty 2

## 2011-10-12 MED ORDER — LACTATED RINGERS IV SOLN
INTRAVENOUS | Status: DC
  Administered 2011-10-12 (×2): via INTRAVENOUS

## 2011-10-12 MED ORDER — ONDANSETRON HCL 4 MG/2ML IJ SOLN
INTRAMUSCULAR | Status: AC
  Filled 2011-10-12: qty 2

## 2011-10-12 MED ORDER — CEFAZOLIN SODIUM 1-5 GM-% IV SOLN: 1.0000 g | INTRAVENOUS | Status: DC

## 2011-10-12 MED ORDER — PROMETHAZINE HCL 25 MG RE SUPP: 25.0000 mg | Freq: Once | RECTAL | Status: DC | PRN

## 2011-10-12 MED ORDER — FENTANYL CITRATE 0.05 MG/ML IJ SOLN: 25.0000 ug | INTRAMUSCULAR | Status: DC | PRN

## 2011-10-12 MED ORDER — LIDOCAINE HCL 1 % IJ SOLN
INTRAMUSCULAR | Status: DC | PRN
  Administered 2011-10-12: 10 mL

## 2011-10-12 MED ORDER — MIDAZOLAM HCL 5 MG/5ML IJ SOLN
INTRAMUSCULAR | Status: DC | PRN
  Administered 2011-10-12: 1 mg via INTRAVENOUS
  Administered 2011-10-12: 2 mg via INTRAVENOUS

## 2011-10-12 MED ORDER — FENTANYL CITRATE 0.05 MG/ML IJ SOLN
INTRAMUSCULAR | Status: AC
  Filled 2011-10-12: qty 2

## 2011-10-12 MED ORDER — KETOROLAC TROMETHAMINE 30 MG/ML IJ SOLN: 15.0000 mg | Freq: Once | INTRAMUSCULAR | Status: DC | PRN

## 2011-10-12 MED ORDER — KETOROLAC TROMETHAMINE 30 MG/ML IJ SOLN
INTRAMUSCULAR | Status: AC
  Filled 2011-10-12: qty 1

## 2011-10-12 MED ORDER — MEPERIDINE HCL 25 MG/ML IJ SOLN: 6.2500 mg | INTRAMUSCULAR | Status: DC | PRN

## 2011-10-12 SURGICAL SUPPLY — 12 items
CATH ROBINSON RED A/P 16FR (CATHETERS) ×2 IMPLANT
CLOTH BEACON ORANGE TIMEOUT ST (SAFETY) ×2 IMPLANT
CONTAINER PREFILL 10% NBF 60ML (FORM) ×4 IMPLANT
GLOVE BIO SURGEON STRL SZ 6.5 (GLOVE) ×4 IMPLANT
GOWN PREVENTION PLUS LG XLONG (DISPOSABLE) ×4 IMPLANT
NDL SPNL 22GX3.5 QUINCKE BK (NEEDLE) ×1 IMPLANT
NEEDLE SPNL 22GX3.5 QUINCKE BK (NEEDLE) ×2 IMPLANT
PACK VAGINAL MINOR WOMEN LF (CUSTOM PROCEDURE TRAY) ×2 IMPLANT
PAD PREP 24X48 CUFFED NSTRL (MISCELLANEOUS) ×2 IMPLANT
SYR CONTROL 10ML LL (SYRINGE) ×2 IMPLANT
TOWEL OR 17X24 6PK STRL BLUE (TOWEL DISPOSABLE) ×4 IMPLANT
WATER STERILE IRR 1000ML POUR (IV SOLUTION) ×2 IMPLANT

## 2011-10-12 NOTE — Anesthesia Postprocedure Evaluation (Signed)
Anesthesia Post Note  Patient: [name]  Procedure(s) Performed: Procedure(s) (LRB): DILATATION AND CURETTAGE (N/A)  Anesthesia type: MAC  Patient location: PACU  Post pain: Pain level controlled  Post assessment: Post-op Vital signs reviewed  Last Vitals:  Filed Vitals:   10/12/11 1400  BP: 151/77  Pulse: 89  Temp: 36.7 C  Resp: 18    Post vital signs: Reviewed  Level of consciousness: sedated  Complications: No apparent anesthesia complications

## 2011-10-12 NOTE — Brief Op Note (Signed)
10/12/2011  1:49 PM  PATIENT:  Lonn Georgia  36 y.o. female  PRE-OPERATIVE DIAGNOSIS:  endometrial polyps, abnormal uterine bleeding  POST-OPERATIVE DIAGNOSIS:  endometrial polyps  PROCEDURE:  Procedure(s) (LRB): DILATATION AND CURETTAGE (N/A)  SURGEON:  Surgeon(s) and Role:    * Jeani Hawking, MD - Primary  PHYSICIAN ASSISTANT:   ASSISTANTS: none   ANESTHESIA:   local and IV sedation  EBL:  Total I/O In: 400 [I.V.:400] Out: -   BLOOD ADMINISTERED:none  DRAINS: none   LOCAL MEDICATIONS USED:  LIDOCAINE   SPECIMEN:  Source of Specimen:  uterine currettings  DISPOSITION OF SPECIMEN:  PATHOLOGY  COUNTS:  YES  TOURNIQUET:  * No tourniquets in log *  DICTATION: .Other Dictation: Dictation Number [id number]  PLAN OF CARE: Discharge to home after PACU  PATIENT DISPOSITION:  PACU - hemodynamically stable.   Delay start of Pharmacological VTE agent (>24hrs) due to surgical blood loss or risk of bleeding: not applicable

## 2011-10-12 NOTE — Discharge Instructions (Signed)

## 2011-10-12 NOTE — Transfer of Care (Signed)
Immediate Anesthesia Transfer of Care Note  Patient: [name]  Procedure(s) Performed: Procedure(s) (LRB): DILATATION AND CURETTAGE (N/A)  Patient Location: PACU  Anesthesia Type: MAC  Level of Consciousness: awake, alert  and oriented  Airway & Oxygen Therapy: Patient Spontanous Breathing and Patient connected to face mask oxygen  Post-op Assessment: Report given to PACU RN and Post -op Vital signs reviewed and stable  Post vital signs: Reviewed and stable  Complications: No apparent anesthesia complications

## 2011-10-12 NOTE — Anesthesia Preprocedure Evaluation (Signed)
Anesthesia Evaluation  Patient identified by MRN, date of birth, ID band Patient awake    Reviewed: Allergy & Precautions, H&P , NPO status , Patient's Chart, lab work & pertinent test results  Airway Mallampati: I TM Distance: >3 FB Neck ROM: full    Dental  (+) Teeth Intact   Pulmonary neg pulmonary ROS,  clear to auscultation  Pulmonary exam normal       Cardiovascular neg cardio ROS     Neuro/Psych Negative Psych ROS   GI/Hepatic Neg liver ROS, GERD-  Medicated and Controlled,  Endo/Other  Hypothyroidism Morbid obesity  Renal/GU negative Renal ROS  Genitourinary negative   Musculoskeletal negative musculoskeletal ROS (+)   Abdominal (+) obese,   Peds negative pediatric ROS (+)  Hematology negative hematology ROS (+)   Anesthesia Other Findings   Reproductive/Obstetrics negative OB ROS                           Anesthesia Physical Anesthesia Plan  ASA: III  Anesthesia Plan: MAC   Post-op Pain Management:    Induction: Intravenous  Airway Management Planned:   Additional Equipment:   Intra-op Plan:   Post-operative Plan:   Informed Consent: I have reviewed the patients History and Physical, chart, labs and discussed the procedure including the risks, benefits and alternatives for the proposed anesthesia with the patient or authorized representative who has indicated his/her understanding and acceptance.     Plan Discussed with: CRNA  Anesthesia Plan Comments:         Anesthesia Quick Evaluation

## 2011-10-12 NOTE — Progress Notes (Signed)
36 year old Gravida 3 Para 3 presents for D and C. She has had irregular bleeding. Ultrasound in the office showed polycystic ovaries, adenomyosis, and small polyps in the endometrium  Medical History  Thyroid Cancer Migraines IBS  Surgical History  Removal of right ovarian cyst and right fallopian tube Thyroidectomy Tonsillectomy/Adenoidectomy  Meds  Protonix Xanax Synthroid Excedrin Provera  Allergies NKDA  Family History  Heart disease Asthma Diabetes Breast Cancer  Ros As above  Afebrile Vital Signs Stable General alert and oriented Lung CTA B Car RRR Abdomen is soft and nontender Pelvic is normal except for above  IMPRESSION: Abnormal uterine bleeding  PLAN: D and C Risks reviewed with the patient Patient is consented.

## 2011-10-12 NOTE — Progress Notes (Signed)
History and physical on the chart. No significant changes. Will proceed with D and C. Risks discussed with the patient

## 2011-10-13 NOTE — Op Note (Signed)
NAMEJOEANN, STEPPE          ACCOUNT NO.:  [id number]  MEDICAL RECORD NO.:  [id number]  LOCATION:  [facility]                          FACILITY:  [facility]  PHYSICIAN:  Gomer France L. Oaklee Sunga, M.D.DATE OF BIRTH:  [birth year]  DATE OF PROCEDURE:  10/12/2011 DATE OF DISCHARGE:  10/12/2011                              OPERATIVE REPORT   PREOPERATIVE DIAGNOSIS:  Abnormal uterine bleeding and endometrial polyps.  POSTOPERATIVE DIAGNOSIS:  Abnormal uterine bleeding and endometrial polyps.  PROCEDURE:  Dilation and curettage.  SURGEON:  Taiquan Campanaro L. Vincente Poli, MD  ANESTHESIA:  MAC with paracervical block.  ESTIMATED BLOOD LOSS:  Minimal.  COMPLICATIONS:  None.  PROCEDURE:  The patient was taken to the operating room.  She was given her sedation, placed in the lithotomy position.  Time-out was performed. She was prepped and draped in usual sterile fashion.  In and out catheter used to empty the bladder.  A speculum was inserted into the vagina, the cervix was grasped with a tenaculum, and paracervical block was performed in standard fashion.  The cervical internal os was gently dilated using Pratt dilators, and a sharp curette was inserted, and the uterus was thoroughly curetted off all tissue.  A moderate amount of tissue was retrieved.  All tissue was sent for gross microscopic analysis.  All instruments were removed from the vagina.  The patient tolerated the procedure very well.  She went to recovery room in stable condition.     Shahla Betsill L. Vincente Poli, M.D.     Florestine Avers  D:  10/12/2011  T:  10/13/2011  Job:  [id number]

## 2011-10-16 ENCOUNTER — Encounter (HOSPITAL_COMMUNITY): Payer: Self-pay | Admitting: Obstetrics and Gynecology

## 2011-10-25 ENCOUNTER — Emergency Department (HOSPITAL_COMMUNITY): Payer: Managed Care, Other (non HMO)

## 2011-10-25 ENCOUNTER — Encounter (HOSPITAL_COMMUNITY): Payer: Self-pay

## 2011-10-25 ENCOUNTER — Emergency Department (HOSPITAL_COMMUNITY)
Admission: EM | Admit: 2011-10-25 | Discharge: 2011-10-25 | Disposition: A | Discharge status: Home / Self Care | Payer: Managed Care, Other (non HMO) | Attending: Emergency Medicine | Admitting: Emergency Medicine

## 2011-10-25 DIAGNOSIS — K2970 Gastritis, unspecified, without bleeding: Secondary | ICD-10-CM

## 2011-10-25 DIAGNOSIS — K7689 Other specified diseases of liver: Secondary | ICD-10-CM | POA: Insufficient documentation

## 2011-10-25 DIAGNOSIS — E039 Hypothyroidism, unspecified: Secondary | ICD-10-CM | POA: Insufficient documentation

## 2011-10-25 DIAGNOSIS — Z85850 Personal history of malignant neoplasm of thyroid: Secondary | ICD-10-CM | POA: Insufficient documentation

## 2011-10-25 DIAGNOSIS — R109 Unspecified abdominal pain: Secondary | ICD-10-CM | POA: Insufficient documentation

## 2011-10-25 LAB — DIFFERENTIAL
Basophils Absolute: 0 10*3/uL (ref 0.0–0.1)
Basophils Relative: 0 % (ref 0–1)
Eosinophils Absolute: 0.1 10*3/uL (ref 0.0–0.7)
Eosinophils Relative: 1 % (ref 0–5)
Lymphocytes Relative: 23 % (ref 12–46)
Lymphs Abs: 2 10*3/uL (ref 0.7–4.0)
Monocytes Absolute: 0.4 10*3/uL (ref 0.1–1.0)
Monocytes Relative: 5 % (ref 3–12)
Neutro Abs: 6 10*3/uL (ref 1.7–7.7)
Neutrophils Relative %: 71 % (ref 43–77)

## 2011-10-25 LAB — COMPREHENSIVE METABOLIC PANEL
ALT: 27 U/L (ref 0–35)
AST: 39 U/L — ABNORMAL HIGH (ref 0–37)
Albumin: 3.5 g/dL (ref 3.5–5.2)
Alkaline Phosphatase: 69 U/L (ref 39–117)
BUN: 11 mg/dL (ref 6–23)
CO2: 23 mEq/L (ref 19–32)
Calcium: 8.8 mg/dL (ref 8.4–10.5)
Chloride: 105 mEq/L (ref 96–112)
Creatinine, Ser: 0.56 mg/dL (ref 0.50–1.10)
GFR calc Af Amer: >90 mL/min (ref >90)
GFR calc non Af Amer: >90 mL/min (ref >90)
Glucose, Bld: 116 mg/dL — ABNORMAL HIGH (ref 70–99)
Potassium: 5.6 mEq/L — ABNORMAL HIGH (ref 3.5–5.1)
Sodium: 135 mEq/L (ref 135–145)
Total Bilirubin: 0.3 mg/dL (ref 0.3–1.2)
Total Protein: 7.3 g/dL (ref 6.0–8.3)

## 2011-10-25 LAB — URINALYSIS, ROUTINE W REFLEX MICROSCOPIC
Bilirubin Urine: NEGATIVE
Glucose, UA: NEGATIVE mg/dL
Ketones, ur: NEGATIVE mg/dL
Nitrite: NEGATIVE
Protein, ur: NEGATIVE mg/dL
Specific Gravity, Urine: 1.025 (ref 1.005–1.030)
Urobilinogen, UA: 0.2 mg/dL (ref 0.0–1.0)
pH: 6 (ref 5.0–8.0)

## 2011-10-25 LAB — CBC
HCT: 38 % (ref 36.0–46.0)
Hemoglobin: 12.5 g/dL (ref 12.0–15.0)
MCH: 27.1 pg (ref 26.0–34.0)
MCHC: 32.9 g/dL (ref 30.0–36.0)
MCV: 82.4 fL (ref 78.0–100.0)
Platelets: 405 10*3/uL — ABNORMAL HIGH (ref 150–400)
RBC: 4.61 MIL/uL (ref 3.87–5.11)
RDW: 14.2 % (ref 11.5–15.5)
WBC: 8.5 10*3/uL (ref 4.0–10.5)

## 2011-10-25 LAB — URINE MICROSCOPIC-ADD ON

## 2011-10-25 LAB — PREGNANCY, URINE: Preg Test, Ur: NEGATIVE

## 2011-10-25 LAB — LIPASE, BLOOD: Lipase: 44 U/L (ref 11–59)

## 2011-10-25 MED ORDER — SODIUM CHLORIDE 0.9 % IV BOLUS (SEPSIS)
1000.0000 mL | Freq: Once | INTRAVENOUS | Status: AC
  Administered 2011-10-25: 1000 mL via INTRAVENOUS

## 2011-10-25 MED ORDER — HYDROCODONE-ACETAMINOPHEN 5-325 MG PO TABS: 1.0000 | ORAL_TABLET | ORAL | Status: DC | PRN

## 2011-10-25 MED ORDER — ONDANSETRON 8 MG PO TBDP: 8.0000 mg | ORAL_TABLET | Freq: Three times a day (TID) | ORAL | Status: AC | PRN

## 2011-10-25 MED ORDER — MORPHINE SULFATE 4 MG/ML IJ SOLN
4.0000 mg | Freq: Once | INTRAMUSCULAR | Status: AC
  Administered 2011-10-25: 4 mg via INTRAVENOUS
  Filled 2011-10-25: qty 1

## 2011-10-25 MED ORDER — ONDANSETRON 8 MG PO TBDP
8.0000 mg | ORAL_TABLET | Freq: Once | ORAL | Status: AC
  Administered 2011-10-25: 8 mg via ORAL
  Filled 2011-10-25: qty 1

## 2011-10-25 MED ORDER — PANTOPRAZOLE SODIUM 40 MG IV SOLR
40.0000 mg | Freq: Once | INTRAVENOUS | Status: AC
  Administered 2011-10-25: 40 mg via INTRAVENOUS
  Filled 2011-10-25: qty 40

## 2011-10-25 MED ORDER — IOHEXOL 300 MG/ML  SOLN
125.0000 mL | Freq: Once | INTRAMUSCULAR | Status: AC | PRN
  Administered 2011-10-25: 125 mL via INTRAVENOUS

## 2011-10-25 MED ORDER — SODIUM CHLORIDE 0.9 % IV SOLN
Freq: Once | INTRAVENOUS | Status: AC
  Administered 2011-10-25: 250 mL/h via INTRAVENOUS

## 2011-10-25 NOTE — ED Notes (Signed)
Ultrasound in the room performing test

## 2011-10-25 NOTE — ED Provider Notes (Signed)
History     CSN: [id number]  Arrival date & time 10/25/11  1011   First MD Initiated Contact with Patient 10/25/11 1022      Chief Complaint  Patient presents with  . Abdominal Pain  . Emesis    (Consider location/radiation/quality/duration/timing/severity/associated sxs/prior treatment) Patient is a 36 y.o. female presenting with abdominal pain and vomiting. The history is provided by the patient.  Abdominal Pain The primary symptoms of the illness include abdominal pain and vomiting.  Emesis  Associated symptoms include abdominal pain.   Patient with past medical history of GERD, hypothyroidism, and anxiety presents with epigastric abdominal pain which started this morning around 8:30. She states that she just arrived at work when she had sudden onset of burning pain in the epigastric region. This seems to radiate to her back. She states it feels slightly better when she applies pressure to the area. No aggravating factors. It has been accompanied by nausea and vomiting. Her last bowel movement was this morning and was normal for her. She denies having any urinary symptoms, vaginal bleeding or discharge. Denies any chest pain, shortness of breath, diaphoresis. She has not had fever or chills. She is currently on Protonix for GERD and has been taking this as prescribed.   She states that she was told several months ago that her gallbladder had reduced function, but she was not told that she had any gallstones. Additionally, she is about 2 weeks postop from a dilation and curettage which was performed for fibroids.  Past Medical History  Diagnosis Date  . Thyroid disease   . PONV (postoperative nausea and vomiting)   . Hypothyroidism   . Anxiety   . GERD (gastroesophageal reflux disease)   . Headache   . Cancer 2010    thyroidectomy    Past Surgical History  Procedure Date  . Tonsillectomy and adenoidectomy   . Eye surgery     left eye x 2 surgeries, 1 right eye surg  .  Thyroidectomy   . Diagnostic laparoscopy 1994    x 2  . Bilateral arm lift surgery 2005  . Tonsillectomy 2011  . Colonscopy   . Svd     x 3  . Removal of iud   . Dilation and curettage of uterus 10/12/2011    Procedure: DILATATION AND CURETTAGE;  Surgeon: [facility], MD;  Location: [facility];  Service: Gynecology;  Laterality: N/A;    History reviewed. No pertinent family history.  History  Substance Use Topics  . Smoking status: Former Smoker -- 1.0 packs/day for 5 years    Types: Cigarettes    Quit date: 01/26/2005  . Smokeless tobacco: Never Used  . Alcohol Use: Yes     occasionally    OB History    Grav Para Term Preterm Abortions TAB SAB Ect Mult Living                  Review of Systems  Gastrointestinal: Positive for vomiting and abdominal pain.  All other systems reviewed and are negative.    Allergies  Review of patient's allergies indicates no known allergies.  Home Medications   Current Outpatient Rx  Name Route Sig Dispense Refill  . ALPRAZOLAM 1 MG PO TABS Oral Take 1 mg by mouth 3 (three) times daily as needed. For anxiety     . ASPIRIN-ACETAMINOPHEN-CAFFEINE 250-250-65 MG PO TABS Oral Take 1 tablet by mouth every 6 (six) hours as needed. For pain     .  GOODY HEADACHE PO Oral Take 1 Package by mouth daily as needed. For headache     . LEVOTHYROXINE SODIUM 137 MCG PO TABS Oral Take 137 mcg by mouth 2 (two) times daily.    Marland Kitchen PANTOPRAZOLE SODIUM 40 MG PO TBEC Oral Take 40 mg by mouth daily. For indigestion      BP 100/70  Pulse 100  Temp(Src) 97.7 F (36.5 C) (Oral)  Resp 18  Ht 5\' 4"  (1.626 m)  Wt 320 lb (145.151 kg)  BMI 54.93 kg/m2  SpO2 99%  LMP 10/12/2010  Physical Exam  Nursing note and vitals reviewed. Constitutional: She is oriented to person, place, and time. She appears well-developed and well-nourished. No distress.       Morbidly obese  HENT:  Head: Normocephalic and atraumatic.  Right Ear: External ear normal.  Left Ear:  External ear normal.  Eyes: EOM are normal. Pupils are equal, round, and reactive to light.  Neck: Normal range of motion.  Cardiovascular: Normal rate, regular rhythm and normal heart sounds.   Pulmonary/Chest: Effort normal and breath sounds normal. She exhibits no tenderness.  Abdominal: Soft. Bowel sounds are normal.       Nontender to palpation. There is no rebound or guarding. Negative Murphy's sign. Abdomen obese.  Musculoskeletal: Normal range of motion.  Neurological: She is alert and oriented to person, place, and time.  Skin: Skin is warm. No rash noted. She is not diaphoretic.  Psychiatric: She has a normal mood and affect.    ED Course  Procedures (including critical care time)  Labs Reviewed  COMPREHENSIVE METABOLIC PANEL - Abnormal; Notable for the following:    Potassium 5.6 (*)    Glucose, Bld 116 (*)    AST 39 (*)    All other components within normal limits  CBC - Abnormal; Notable for the following:    Platelets 405 (*)    All other components within normal limits  URINALYSIS, ROUTINE W REFLEX MICROSCOPIC - Abnormal; Notable for the following:    APPearance TURBID (*)    Hgb urine dipstick TRACE (*)    Leukocytes, UA MODERATE (*)    All other components within normal limits  URINE MICROSCOPIC-ADD ON - Abnormal; Notable for the following:    Squamous Epithelial / LPF MANY (*)    Bacteria, UA MANY (*)    All other components within normal limits  DIFFERENTIAL  LIPASE, BLOOD  PREGNANCY, URINE  Specimen hemolyzed, which is the likely cause of mild hyperK Many bacteria seen on urinalysis, however, the sample appears contaminated with epithelial cells  US Abdomen Complete  10/25/2011  *RADIOLOGY REPORT*  Clinical Data:  Epigastric pain, nausea, vomiting  ULTRASOUND ABDOMEN:  Technique:  Sonography of upper abdominal structures was performed. Image quality degraded diffusely by body habitus.  Comparison:  08/10/2011  Gallbladder:  Normally distended without  stones or wall thickening. No pericholecystic fluid or sonographic Murphy sign.  Common bile duct:  6 mm diameter, upper normal.  Liver:  Echogenic, likely fatty infiltration, though this can be seen with cirrhosis and certain infiltrative disorders.  No gross hepatic mass nodularity identified, though intrahepatic detail is severely limited due to poor sound transmission as a result of fatty infiltration and body habitus.  IVC:  Inadequately visualized due to poor hepatic transmission of sound.  Pancreas:  Obscured by bowel gas.  Spleen:  Normal appearance, 7.1 cm length  Right kidney:  4.6 cm length. Normal morphology without mass or hydronephrosis.  Left kidney:  12.8  cm length. Normal morphology without mass or hydronephrosis.  Aorta:  Normal caliber proximally, distally obscured by bowel gas  Other:  No free fluid  IMPRESSION: Probable fatty infiltration of liver as above. Inadequate visualization of pancreas, IVC, distal aorta, and portions of the the liver as above. If better visualization of the structures is required, consider CT with IV and oral contrast.  Original Report Authenticated By: Lollie Marrow, M.D.   Ct Abdomen Pelvis W Contrast  10/25/2011  *RADIOLOGY REPORT*  Clinical Data: Abdominal pain, nausea, vomiting, past history thyroid cancer post thyroidectomy and radioactive iodine therapy  CT ABDOMEN AND PELVIS WITH CONTRAST  Technique:  Multidetector CT imaging of the abdomen and pelvis was performed following the standard protocol during bolus administration of intravenous contrast.  Sagittal and coronal MPR images reconstructed from axial data set.  Contrast:  Dilute oral contrast. 125 ml Omnipaque 300 IV.  Comparison: 03/09/2009  Findings: Minimal dependent atelectasis right lower lobe. Diffuse fatty infiltration of liver with focal sparing adjacent to gallbladder fossa. Liver, spleen, pancreas, kidneys, and adrenal glands otherwise normal appearance. Unremarkable bladder, uterus and  adnexae. Normal appendix. Stomach and bowel loops normal appearance. No mass, adenopathy, free fluid or inflammatory process. No hernia or acute osseous findings.  IMPRESSION: Fatty infiltration of liver. No acute intra abdominal or intrapelvic abnormalities identified.  Original Report Authenticated By: Lollie Marrow, M.D.   I personally reviewed the imaging studies.   1. Gastritis       MDM  Patient with epigastric pain which began this morning, prior to eating breakfast. It was associated with vomiting. She had no associated shortness of breath or chest pain. She has a known history of biliary dyskinesia; imaging studies performed today do not show evidence of acute cholecystitis or cholelithiasis. CT shows evidence of fatty liver, but does not show any other acute findings. Her labs today were unremarkable. She was medicated with several doses of morphine and Zofran in addition to Protonix and did well with this. We'll treat as possible gastritis symptomatically with analgesics and anti-medics. She was strongly encouraged to make a followup appointment with her PCP. Return precautions discussed.        Grant Fontana, [place] 10/25/11 [phone]

## 2011-10-25 NOTE — ED Notes (Signed)
Patient transported to CT 

## 2011-10-25 NOTE — ED Notes (Signed)
Per EMS- Patient was at work an had a sudden onset of epigastric pain (burning) with vomiting.

## 2011-10-25 NOTE — ED Notes (Signed)
WUJ:[id number]<JY> Expected date:10/25/11<BR> Expected time: 9:47 AM<BR> Means of arrival:Ambulance<BR> Comments:<BR>

## 2011-10-26 NOTE — ED Provider Notes (Signed)
Medical screening examination/treatment/procedure(s) were performed by non-physician practitioner and as supervising physician I was immediately available for consultation/collaboration.  Flint Melter, MD 10/26/11 [phone]

## 2011-10-27 NOTE — Patient Instructions (Signed)
22 [name]  10/27/2011   Your procedure is scheduled on:  11/01/11  Report to [facility] at 08:30 AM.  Call this number if you have problems the morning of surgery: [phone]   Remember:   Do not eat food:After Midnight.  May have clear liquids:until Midnight .  Clear liquids include soda, tea, black coffee, apple or grape juice, broth.  Take these medicines the morning of surgery with A SIP OF WATER: Xanax, Synthroid, and Protonix.   Do not wear jewelry, make-up or nail polish.  Do not wear lotions, powders, or perfumes. You may wear deodorant.  Do not shave 48 hours prior to surgery.  Do not bring valuables to the hospital.  Contacts, dentures or bridgework may not be worn into surgery.  Leave suitcase in the car. After surgery it may be brought to your room.  For patients admitted to the hospital, checkout time is 11:00 AM the day of discharge.   Patients discharged the day of surgery will not be allowed to drive home.  Name and phone number of your driver:   Special Instructions: CHG Shower Use Special Wash: 1/2 bottle night before surgery and 1/2 bottle morning of surgery.   Please read over the following fact sheets that you were given: Pain Booklet, MRSA Information, Surgical Site Infection Prevention, Anesthesia Post-op Instructions and Care and Recovery After Surgery    Laparoscopic Cholecystectomy Laparoscopic cholecystectomy is surgery to remove the gallbladder. The gallbladder is located slightly to the right of center in the abdomen, behind the liver. It is a concentrating and storage sac for the bile produced in the liver. Bile aids in the digestion and absorption of fats. Gallbladder disease (cholecystitis) is an inflammation of your gallbladder. This condition is usually caused by a buildup of gallstones (cholelithiasis) in your gallbladder. Gallstones can block the flow of bile, resulting in inflammation and pain. In severe cases, emergency surgery may be  required. When emergency surgery is not required, you will have time to prepare for the procedure. Laparoscopic surgery is an alternative to open surgery. Laparoscopic surgery usually has a shorter recovery time. Your common bile duct may also need to be examined and explored. Your caregiver will discuss this with you if he or she feels this should be done. If stones are found in the common bile duct, they may be removed. LET YOUR CAREGIVER KNOW ABOUT:  Allergies to food or medicine.   Medicines taken, including vitamins, herbs, eyedrops, over-the-counter medicines, and creams.   Use of steroids (by mouth or creams).   Previous problems with anesthetics or numbing medicines.   History of bleeding problems or blood clots.   Previous surgery.   Other health problems, including diabetes and kidney problems.   Possibility of pregnancy, if this applies.  RISKS AND COMPLICATIONS All surgery is associated with risks. Some problems that may occur following this procedure include:  Infection.   Damage to the common bile duct, nerves, arteries, veins, or other internal organs such as the stomach or intestines.   Bleeding.   A stone may remain in the common bile duct.  BEFORE THE PROCEDURE  Do not take aspirin for 3 days prior to surgery or blood thinners for 1 week prior to surgery.   Do not eat or drink anything after midnight the night before surgery.   Let your caregiver know if you develop a cold or other infectious problem prior to surgery.   You should be present 60 minutes before the procedure  or as directed.  PROCEDURE  You will be given medicine that makes you sleep (general anesthetic). When you are asleep, your surgeon will make several small cuts (incisions) in your abdomen. One of these incisions is used to insert a small, lighted scope (laparoscope) into the abdomen. The laparoscope helps the surgeon see into your abdomen. Carbon dioxide gas will be pumped into your  abdomen. The gas allows more room for the surgeon to perform your surgery. Other operating instruments are inserted through the other incisions. Laparoscopic procedures may not be appropriate when:  There is major scarring from previous surgery.   The gallbladder is extremely inflamed.   There are bleeding disorders or unexpected cirrhosis of the liver.   A pregnancy is near term.   Other conditions make the laparoscopic procedure impossible.  If your surgeon feels it is not safe to continue with a laparoscopic procedure, he or she will perform an open abdominal procedure. In this case, the surgeon will make an incision to open the abdomen. This gives the surgeon a larger view and field to work within. This may allow the surgeon to perform procedures that sometimes cannot be performed with a laparoscope alone. Open surgery has a longer recovery time. AFTER THE PROCEDURE  You will be taken to the recovery area where a nurse will watch and check your progress.   You may be allowed to go home the same day.   Do not resume physical activities until directed by your caregiver.   You may resume a normal diet and activities as directed.  Document Released: 08/14/2005 Document Revised: 04/26/2011 Document Reviewed: 01/27/2011 [facility] Patient Information 2012 [place], [place].   PATIENT INSTRUCTIONS POST-ANESTHESIA  IMMEDIATELY FOLLOWING SURGERY:  Do not drive or operate machinery for the first twenty four hours after surgery.  Do not make any important decisions for twenty four hours after surgery or while taking narcotic pain medications or sedatives.  If you develop intractable nausea and vomiting or a severe headache please notify your doctor immediately.  FOLLOW-UP:  Please make an appointment with your surgeon as instructed. You do not need to follow up with anesthesia unless specifically instructed to do so.  WOUND CARE INSTRUCTIONS (if applicable):  Keep a dry clean dressing on the  anesthesia/puncture wound site if there is drainage.  Once the wound has quit draining you may leave it open to air.  Generally you should leave the bandage intact for twenty four hours unless there is drainage.  If the epidural site drains for more than 36-48 hours please call the anesthesia department.  QUESTIONS?:  Please feel free to call your physician or the hospital operator if you have any questions, and they will be happy to assist you.     [facility] Anesthesia Department [address] [place] [phone]

## 2011-10-30 ENCOUNTER — Encounter (HOSPITAL_COMMUNITY)
Admission: RE | Admit: 2011-10-30 | Discharge: 2011-10-30 | Disposition: A | Discharge status: Home / Self Care | Payer: Managed Care, Other (non HMO) | Source: Ambulatory Visit | Attending: General Surgery | Admitting: General Surgery

## 2011-10-30 ENCOUNTER — Encounter (HOSPITAL_COMMUNITY): Payer: Self-pay

## 2011-10-30 ENCOUNTER — Encounter (HOSPITAL_COMMUNITY): Payer: Self-pay | Admitting: Pharmacy Technician

## 2011-10-30 LAB — CBC
HCT: 39.6 % (ref 36.0–46.0)
Hemoglobin: 12.9 g/dL (ref 12.0–15.0)
MCH: 26.8 pg (ref 26.0–34.0)
MCHC: 32.6 g/dL (ref 30.0–36.0)
MCV: 82.3 fL (ref 78.0–100.0)
Platelets: 435 10*3/uL — ABNORMAL HIGH (ref 150–400)
RBC: 4.81 MIL/uL (ref 3.87–5.11)
RDW: 14.2 % (ref 11.5–15.5)
WBC: 8.5 10*3/uL (ref 4.0–10.5)

## 2011-10-30 LAB — DIFFERENTIAL
Basophils Absolute: 0 10*3/uL (ref 0.0–0.1)
Basophils Relative: 0 % (ref 0–1)
Eosinophils Absolute: 0.1 10*3/uL (ref 0.0–0.7)
Eosinophils Relative: 1 % (ref 0–5)
Lymphocytes Relative: 33 % (ref 12–46)
Lymphs Abs: 2.8 10*3/uL (ref 0.7–4.0)
Monocytes Absolute: 0.6 10*3/uL (ref 0.1–1.0)
Monocytes Relative: 7 % (ref 3–12)
Neutro Abs: 5 10*3/uL (ref 1.7–7.7)
Neutrophils Relative %: 59 % (ref 43–77)

## 2011-10-30 LAB — BASIC METABOLIC PANEL
BUN: 12 mg/dL (ref 6–23)
CO2: 25 mEq/L (ref 19–32)
Calcium: 9.5 mg/dL (ref 8.4–10.5)
Chloride: 103 mEq/L (ref 96–112)
Creatinine, Ser: 0.73 mg/dL (ref 0.50–1.10)
GFR calc Af Amer: >90 mL/min (ref >90)
GFR calc non Af Amer: >90 mL/min (ref >90)
Glucose, Bld: 117 mg/dL — ABNORMAL HIGH (ref 70–99)
Potassium: 4.1 mEq/L (ref 3.5–5.1)
Sodium: 138 mEq/L (ref 135–145)

## 2011-10-30 LAB — SURGICAL PCR SCREEN
MRSA, PCR: NEGATIVE
Staphylococcus aureus: NEGATIVE

## 2011-10-30 LAB — HCG, QUANTITATIVE, PREGNANCY: hCG, Beta Chain, Quant, S: <1 m[IU]/mL (ref <5)

## 2011-10-30 NOTE — H&P (Signed)
  [facility] SOAP Note  Vital Signs:  Vitals as of: 10/26/2011: Systolic 156: Diastolic 97: Heart Rate 86: Temp 98.67F: Height 82ft 4in: Weight 300Lbs 0 Ounces: OFC 0in: Respiratory Rate 0: O2 Saturation 0: Pain Level 5: BMI 51  BMI : 51.49 kg/m2  Subjective: This 58 Years 71 Months old Female presents forSymptoms of PROBLEM epigastric abdominal pain.  Patient had symptoms yesterday and presented to the ED.  Was told the pain was likely related to gb dz.  Patient had prior workup demonstrating decreased EF and symptoms.  Never had these symptoms or last this long.  No exacerbating features.  No improving factors.  Pain was reported as constant.  Sharp.  Some radiation to right side.  +nausea.  Noon-bloody emesis.  No change with BM.  +Fever and chills.    Review of Symptoms:  sweats, chills headaches :blurred Nose/Mouth/Throat:unremarkable Cardiovascular:unremarkable Respiratory:unremarkable as per HPI Genitourinary:unremarkable Musculoskeletal:unremarkable Skin:unremarkable Breast:unremarkable Hematolgic/Lymphatic:unremarkable Allergic/Immunologic:unremarkable   Past Medical History:Obtained   Past Medical History  Pregnancy Gravida: 3 Pregnancy Para: 3 Surgical History: ovarian surgery x2, Thyroidectomy, T&amp;A, DNC Medical Problems: Hypercholesterolemia, hypothyroidism, morbid obesity Psychiatric History: anxiety Allergies: NKDA Medications: xanax, pantoprozol, levothyroxine   Social History:Obtained   Social History  Preferred Language: English ([place]) Race:  White Ethnicity: Not Hispanic / Latino Age: 36 Years 7 Months Marital Status:  M Alcohol: no Recreational drug(s): no   Smoking Status: Never smoker reviewed on 10/26/2011  Family History:Obtained   Family History  Is there a family history of:DM, CAD< Breast CA    Objective Information: General:Well appearing, well nourished in no distress.Morbidly  obese Skin:no rash or prominent lesions Head:Atraumatic; no masses; no abnormalities Eyes:conjunctiva clear, EOM intact, PERRL Mouth:Mucous membranes moist, no mucosal lesions. Neck:Supple without lymphadenopathy.  Heart:RRR, no murmur Lungs:CTA bilaterally, no wheezes, rhonchi, rales.  Breathing unlabored. soft, obese.  +BS.  Mod RUQ tenderness.  No difffuse peritoneal signs.  No masses or hernias although exam limited. Extremities:No deformities, clubbing, cyanosis, or edema.    HIDA:  EF 24.9%.  +symptoms.   Assessment:  Diagnosis &amp; Procedure: DiagnosisCode: 575.8, ProcedureCode: 16109,    Plan: Biliary dyskinesia.  Options discussed.  Possible continued symptoms unrelated to GB discussed.  Will proceed at patient's convenience.  Patient told indications to return to ED.  Patient Education:Alternative treatments to surgery were discussed with patient (and family).Risks and benefits  of procedure were fully explained to the patient (and family) who gave informed consent. Patient/family questions were addressed.  Follow-up:Pending Surgery

## 2011-11-01 ENCOUNTER — Encounter (HOSPITAL_COMMUNITY)
Admission: RE | Disposition: A | Discharge status: Home / Self Care | Payer: Self-pay | Source: Ambulatory Visit | Attending: General Surgery

## 2011-11-01 ENCOUNTER — Ambulatory Visit (HOSPITAL_COMMUNITY): Payer: Managed Care, Other (non HMO) | Admitting: Anesthesiology

## 2011-11-01 ENCOUNTER — Ambulatory Visit (HOSPITAL_COMMUNITY)
Admission: RE | Admit: 2011-11-01 | Discharge: 2011-11-01 | Disposition: A | Discharge status: Home / Self Care | Payer: Managed Care, Other (non HMO) | Source: Ambulatory Visit | Attending: General Surgery | Admitting: General Surgery

## 2011-11-01 ENCOUNTER — Encounter (HOSPITAL_COMMUNITY): Payer: Self-pay | Admitting: Anesthesiology

## 2011-11-01 ENCOUNTER — Encounter (HOSPITAL_COMMUNITY): Payer: Self-pay | Admitting: *Deleted

## 2011-11-01 DIAGNOSIS — K7689 Other specified diseases of liver: Secondary | ICD-10-CM | POA: Insufficient documentation

## 2011-11-01 DIAGNOSIS — E7800 Pure hypercholesterolemia, unspecified: Secondary | ICD-10-CM | POA: Insufficient documentation

## 2011-11-01 DIAGNOSIS — K828 Other specified diseases of gallbladder: Secondary | ICD-10-CM

## 2011-11-01 DIAGNOSIS — Z01812 Encounter for preprocedural laboratory examination: Secondary | ICD-10-CM | POA: Insufficient documentation

## 2011-11-01 DIAGNOSIS — Z6841 Body Mass Index (BMI) 40.0 and over, adult: Secondary | ICD-10-CM | POA: Insufficient documentation

## 2011-11-01 DIAGNOSIS — K811 Chronic cholecystitis: Secondary | ICD-10-CM | POA: Insufficient documentation

## 2011-11-01 HISTORY — PX: CHOLECYSTECTOMY: SHX55

## 2011-11-01 SURGERY — LAPAROSCOPIC CHOLECYSTECTOMY
Anesthesia: General | Site: Abdomen | Wound class: Contaminated

## 2011-11-01 MED ORDER — PROPOFOL 10 MG/ML IV EMUL
INTRAVENOUS | Status: DC | PRN
  Administered 2011-11-01: 200 mg via INTRAVENOUS

## 2011-11-01 MED ORDER — ONDANSETRON HCL 4 MG/2ML IJ SOLN
4.0000 mg | Freq: Once | INTRAMUSCULAR | Status: AC
  Administered 2011-11-01: 4 mg via INTRAVENOUS

## 2011-11-01 MED ORDER — ROCURONIUM BROMIDE 50 MG/5ML IV SOLN
INTRAVENOUS | Status: AC
  Filled 2011-11-01: qty 1

## 2011-11-01 MED ORDER — BUPIVACAINE HCL (PF) 0.5 % IJ SOLN
INTRAMUSCULAR | Status: DC | PRN
  Administered 2011-11-01: 10 mL

## 2011-11-01 MED ORDER — DEXAMETHASONE SODIUM PHOSPHATE 4 MG/ML IJ SOLN
4.0000 mg | Freq: Once | INTRAMUSCULAR | Status: AC
  Administered 2011-11-01: 4 mg via INTRAVENOUS

## 2011-11-01 MED ORDER — CEFAZOLIN SODIUM 1-5 GM-% IV SOLN
INTRAVENOUS | Status: AC
  Filled 2011-11-01: qty 50

## 2011-11-01 MED ORDER — SCOPOLAMINE 1 MG/3DAYS TD PT72
MEDICATED_PATCH | TRANSDERMAL | Status: AC
  Administered 2011-11-01: 1.5 mg via TRANSDERMAL
  Filled 2011-11-01: qty 1

## 2011-11-01 MED ORDER — LACTATED RINGERS IV SOLN
INTRAVENOUS | Status: DC
  Administered 2011-11-01: 1000 mL via INTRAVENOUS

## 2011-11-01 MED ORDER — CEFAZOLIN SODIUM 1-5 GM-% IV SOLN: 1.0000 g | INTRAVENOUS | Status: DC

## 2011-11-01 MED ORDER — SUCCINYLCHOLINE CHLORIDE 20 MG/ML IJ SOLN
INTRAMUSCULAR | Status: AC
  Filled 2011-11-01: qty 1

## 2011-11-01 MED ORDER — FENTANYL CITRATE 0.05 MG/ML IJ SOLN
INTRAMUSCULAR | Status: AC
  Administered 2011-11-01: 50 ug via INTRAVENOUS
  Filled 2011-11-01: qty 2

## 2011-11-01 MED ORDER — ONDANSETRON HCL 4 MG/2ML IJ SOLN
INTRAMUSCULAR | Status: AC
  Administered 2011-11-01: 4 mg via INTRAVENOUS
  Filled 2011-11-01: qty 2

## 2011-11-01 MED ORDER — BUPIVACAINE HCL (PF) 0.5 % IJ SOLN
INTRAMUSCULAR | Status: AC
  Filled 2011-11-01: qty 30

## 2011-11-01 MED ORDER — GLYCOPYRROLATE 0.2 MG/ML IJ SOLN
0.2000 mg | Freq: Once | INTRAMUSCULAR | Status: AC
  Administered 2011-11-01: 0.2 mg via INTRAVENOUS

## 2011-11-01 MED ORDER — DEXAMETHASONE SODIUM PHOSPHATE 4 MG/ML IJ SOLN
INTRAMUSCULAR | Status: AC
  Administered 2011-11-01: 4 mg via INTRAVENOUS
  Filled 2011-11-01: qty 1

## 2011-11-01 MED ORDER — MIDAZOLAM HCL 2 MG/2ML IJ SOLN
1.0000 mg | INTRAMUSCULAR | Status: DC | PRN
  Administered 2011-11-01: 2 mg via INTRAVENOUS

## 2011-11-01 MED ORDER — LACTATED RINGERS IV SOLN
INTRAVENOUS | Status: DC | PRN
  Administered 2011-11-01 (×2): via INTRAVENOUS

## 2011-11-01 MED ORDER — FENTANYL CITRATE 0.05 MG/ML IJ SOLN
INTRAMUSCULAR | Status: AC
  Administered 2011-11-01: 50 ug via INTRAVENOUS
  Filled 2011-11-01: qty 5

## 2011-11-01 MED ORDER — HYDROCODONE-ACETAMINOPHEN 5-325 MG PO TABS: 1.0000 | ORAL_TABLET | ORAL | Status: AC | PRN

## 2011-11-01 MED ORDER — SCOPOLAMINE 1 MG/3DAYS TD PT72
1.0000 | MEDICATED_PATCH | Freq: Once | TRANSDERMAL | Status: DC
  Administered 2011-11-01: 1.5 mg via TRANSDERMAL

## 2011-11-01 MED ORDER — SODIUM CHLORIDE 0.9 % IR SOLN
Status: DC | PRN
  Administered 2011-11-01: 1000 mL

## 2011-11-01 MED ORDER — FENTANYL CITRATE 0.05 MG/ML IJ SOLN
25.0000 ug | INTRAMUSCULAR | Status: DC | PRN
  Administered 2011-11-01 (×4): 50 ug via INTRAVENOUS

## 2011-11-01 MED ORDER — LIDOCAINE HCL (CARDIAC) 10 MG/ML IV SOLN
INTRAVENOUS | Status: DC | PRN
  Administered 2011-11-01: 50 mg via INTRAVENOUS

## 2011-11-01 MED ORDER — ROCURONIUM BROMIDE 100 MG/10ML IV SOLN
INTRAVENOUS | Status: DC | PRN
  Administered 2011-11-01: 20 mg via INTRAVENOUS

## 2011-11-01 MED ORDER — FENTANYL CITRATE 0.05 MG/ML IJ SOLN
INTRAMUSCULAR | Status: DC | PRN
  Administered 2011-11-01 (×2): 100 ug via INTRAVENOUS
  Administered 2011-11-01: 50 ug via INTRAVENOUS

## 2011-11-01 MED ORDER — MIDAZOLAM HCL 2 MG/2ML IJ SOLN
INTRAMUSCULAR | Status: AC
  Administered 2011-11-01: 2 mg via INTRAVENOUS
  Filled 2011-11-01: qty 2

## 2011-11-01 MED ORDER — CELECOXIB 100 MG PO CAPS
ORAL_CAPSULE | ORAL | Status: AC
  Filled 2011-11-01: qty 4

## 2011-11-01 MED ORDER — CEFAZOLIN SODIUM 1-5 GM-% IV SOLN
INTRAVENOUS | Status: DC | PRN
  Administered 2011-11-01: 2 g via INTRAVENOUS

## 2011-11-01 MED ORDER — SUCCINYLCHOLINE CHLORIDE 20 MG/ML IJ SOLN
INTRAMUSCULAR | Status: DC | PRN
  Administered 2011-11-01: 120 mg via INTRAVENOUS

## 2011-11-01 MED ORDER — ONDANSETRON HCL 4 MG/2ML IJ SOLN
4.0000 mg | Freq: Once | INTRAMUSCULAR | Status: AC | PRN
  Administered 2011-11-01: 4 mg via INTRAVENOUS

## 2011-11-01 MED ORDER — CEFAZOLIN SODIUM 1 G IJ SOLR
INTRAMUSCULAR | Status: AC
  Filled 2011-11-01: qty 10

## 2011-11-01 MED ORDER — GLYCOPYRROLATE 0.2 MG/ML IJ SOLN
INTRAMUSCULAR | Status: AC
  Administered 2011-11-01: 0.2 mg via INTRAVENOUS
  Filled 2011-11-01: qty 1

## 2011-11-01 MED ORDER — CELECOXIB 100 MG PO CAPS: 400.0000 mg | ORAL_CAPSULE | Freq: Every day | ORAL | Status: DC

## 2011-11-01 SURGICAL SUPPLY — 39 items
APL SKNCLS STERI-STRIP NONHPOA (GAUZE/BANDAGES/DRESSINGS) ×1
APPLIER CLIP UNV 5X34 EPIX (ENDOMECHANICALS) ×2 IMPLANT
APR XCLPCLP 20M/L UNV 34X5 (ENDOMECHANICALS) ×1
BAG HAMPER (MISCELLANEOUS) ×2 IMPLANT
BAG SPEC RTRVL LRG 6X4 10 (ENDOMECHANICALS) ×1
BENZOIN TINCTURE PRP APPL 2/3 (GAUZE/BANDAGES/DRESSINGS) ×2 IMPLANT
CLOTH BEACON ORANGE TIMEOUT ST (SAFETY) ×2 IMPLANT
COVER LIGHT HANDLE STERIS (MISCELLANEOUS) ×4 IMPLANT
DECANTER SPIKE VIAL GLASS SM (MISCELLANEOUS) ×2 IMPLANT
DEVICE TROCAR PUNCTURE CLOSURE (ENDOMECHANICALS) ×2 IMPLANT
DURAPREP 26ML APPLICATOR (WOUND CARE) ×2 IMPLANT
ELECT REM PT RETURN 9FT ADLT (ELECTROSURGICAL) ×2
ELECTRODE REM PT RTRN 9FT ADLT (ELECTROSURGICAL) ×1 IMPLANT
FILTER SMOKE EVAC LAPAROSHD (FILTER) ×2 IMPLANT
FORMALIN 10 PREFIL 120ML (MISCELLANEOUS) ×2 IMPLANT
GLOVE BIOGEL PI IND STRL 7.0 (GLOVE) IMPLANT
GLOVE BIOGEL PI IND STRL 7.5 (GLOVE) ×1 IMPLANT
GLOVE BIOGEL PI INDICATOR 7.0 (GLOVE) ×2
GLOVE BIOGEL PI INDICATOR 7.5 (GLOVE) ×1
GLOVE ECLIPSE 6.5 STRL STRAW (GLOVE) ×2 IMPLANT
GLOVE ECLIPSE 7.0 STRL STRAW (GLOVE) ×2 IMPLANT
GOWN STRL REIN XL XLG (GOWN DISPOSABLE) ×6 IMPLANT
HEMOSTAT SNOW SURGICEL 2X4 (HEMOSTASIS) ×1 IMPLANT
INST SET LAPROSCOPIC AP (KITS) ×2 IMPLANT
IV NS IRRIG 3000ML ARTHROMATIC (IV SOLUTION) ×1 IMPLANT
KIT ROOM TURNOVER APOR (KITS) ×2 IMPLANT
KIT TROCAR LAP CHOLE (TROCAR) ×2 IMPLANT
MANIFOLD NEPTUNE II (INSTRUMENTS) ×2 IMPLANT
NDL BIOPSY 14X6 SOFT TISS (NEEDLE) IMPLANT
NEEDLE BIOPSY 14X6 SOFT TISS (NEEDLE) ×2 IMPLANT
PACK LAP CHOLE LZT030E (CUSTOM PROCEDURE TRAY) ×2 IMPLANT
PAD ARMBOARD 7.5X6 YLW CONV (MISCELLANEOUS) ×2 IMPLANT
POUCH SPECIMEN RETRIEVAL 10MM (ENDOMECHANICALS) ×2 IMPLANT
SET BASIN LINEN APH (SET/KITS/TRAYS/PACK) ×2 IMPLANT
SET TUBE IRRIG SUCTION NO TIP (IRRIGATION / IRRIGATOR) IMPLANT
STRIP CLOSURE SKIN 1/2X4 (GAUZE/BANDAGES/DRESSINGS) ×2 IMPLANT
SUT MNCRL AB 4-0 PS2 18 (SUTURE) ×3 IMPLANT
SUT VIC AB 2-0 CT2 27 (SUTURE) ×3 IMPLANT
WARMER LAPAROSCOPE (MISCELLANEOUS) ×2 IMPLANT

## 2011-11-01 NOTE — Transfer of Care (Signed)
Immediate Anesthesia Transfer of Care Note  Patient: [name]  Procedure(s) Performed: Procedure(s) (LRB): LAPAROSCOPIC CHOLECYSTECTOMY (N/A)  Patient Location: PACU  Anesthesia Type: General  Level of Consciousness: awake, alert , oriented and patient cooperative  Airway & Oxygen Therapy: Patient Spontanous Breathing  Post-op Assessment: Report given to PACU RN and Post -op Vital signs reviewed and stable  Post vital signs: Reviewed and stable  Complications: No apparent anesthesia complications

## 2011-11-01 NOTE — Interval H&P Note (Signed)
History and Physical Interval Note:  11/01/2011 10:46 AM  [name]  has presented today for surgery, with the diagnosis of Biliary dyskinesia  The various methods of treatment have been discussed with the patient and family. After consideration of risks, benefits and other options for treatment, the patient has consented to  Procedure(s) (LRB): LAPAROSCOPIC CHOLECYSTECTOMY (N/A) as a surgical intervention .  The patients' history has been reviewed, patient examined, no change in status, stable for surgery.  I have reviewed the patients' chart and labs.  Questions were answered to the patient's satisfaction.  We will also proceed with liver biopsy as discussed with the patient.   [name] C

## 2011-11-01 NOTE — Discharge Instructions (Signed)
Laparoscopic Cholecystectomy Care After These instructions give you information on caring for yourself after your procedure. Your doctor may also give you more specific instructions. Call your doctor if you have any problems or questions after your procedure. HOME CARE  Change your bandages (dressings) as told by your doctor.   Keep the wound dry and clean. Wash the wound gently with soap and water. Pat the wound dry with a clean towel.   Do not take baths, swim, or use hot tubs for 10 days, or as told by your doctor.   Only take medicine as told by your doctor.   Eat a normal diet as told by your doctor.   Do not lift anything heavier than 25 pounds (11.5 kg), or as told by your doctor.   Do not play contact sports for 1 week, or as told by your doctor.  GET HELP RIGHT AWAY IF:   Your wound is red, puffy (swollen), or painful.   You have yellowish-white fluid (pus) coming from the wound.   You have fluid draining from the wound for more than 1 day.   You have a bad smell coming from the wound.   Your wound breaks open.   You have a rash.   You have trouble breathing.   You have chest pain.   You have a bad reaction to your medicine.   You have a fever.   You have pain in the shoulders (shoulder strap areas).   You feel dizzy or pass out (faint).   You have severe belly (abdominal) pain.   You feel sick to your stomach (nauseous) or throw up (vomit) for more than 1 day.  MAKE SURE YOU:  Understand these instructions.   Will watch your condition.   Will get help right away if you are not doing well or get worse.  Document Released: 05/23/2008 Document Revised: 08/03/2011 Document Reviewed: 01/31/2011 [facility] Patient Information 2012 [place], [place].

## 2011-11-01 NOTE — Anesthesia Procedure Notes (Signed)
Procedure Name: Intubation Date/Time: 11/01/2011 11:00 AM Performed by: Carolyne Littles, Taliyah Watrous Pre-anesthesia Checklist: Patient identified, Patient being monitored, Timeout performed, Emergency Drugs available and Suction available Patient Re-evaluated:Patient Re-evaluated prior to inductionOxygen Delivery Method: Circle System Utilized Preoxygenation: Pre-oxygenation with 100% oxygen Intubation Type: IV induction Ventilation: Mask ventilation without difficulty Laryngoscope Size: 3 and Miller Grade View: Grade I Tube type: Oral Tube size: 7.0 mm Number of attempts: 1 Airway Equipment and Method: stylet Placement Confirmation: ETT inserted through vocal cords under direct vision,  positive ETCO2 and breath sounds checked- equal and bilateral Secured at: 21 cm Tube secured with: Tape Dental Injury: Teeth and Oropharynx as per pre-operative assessment

## 2011-11-01 NOTE — Anesthesia Postprocedure Evaluation (Signed)
  Anesthesia Post-op Note  Patient: [name]  Procedure(s) Performed: Procedure(s) (LRB): LAPAROSCOPIC CHOLECYSTECTOMY (N/A)  Patient Location: PACU  Anesthesia Type: General  Level of Consciousness: awake, alert , oriented and patient cooperative  Airway and Oxygen Therapy: Patient Spontanous Breathing and Patient connected to face mask oxygen  Post-op Pain: mild  Post-op Assessment: Post-op Vital signs reviewed, Patient's Cardiovascular Status Stable, Respiratory Function Stable and Patent Airway  Post-op Vital Signs: Reviewed and stable  Complications: No apparent anesthesia complications

## 2011-11-01 NOTE — Preoperative (Signed)
Beta Blockers   Reason not to administer Beta Blockers:Not Applicable 

## 2011-11-01 NOTE — Op Note (Signed)
Patient:  [name]  DOB:  September 30, 1975  MRN:  [id number]   Preop Diagnosis:  Biliary dyskinesia, fatty liver  Postop Diagnosis:  The same  Procedure:  Laparoscopic cholecystectomy and liver biopsy  Surgeon:  Dr. Tilford Pillar  Anes:  General endotracheal, 0.5% Sensorcaine plain for local  Indications:  Patient is a 36 year old female presented my office a history of epigastric and right upper quadrant abdominal pain. She did have exacerbation of her symptomatology with a HIDA scan. Risks benefits alternatives of a laparoscopic possible open cholecystectomy were discussed at length with the patient including but not limited to risk of bleeding, infection, bile leak, small bowel injury, common duct injury, intraoperative cardiac and pulmonary events. Additionally we did discuss the option to proceed with a liver biopsy she does wish to proceed. Patient's questions and concerns are addressed the patient was consented for the planned procedure.  Procedure note:  Patient is taken to the OR placed in a supine position on the OR table time the general anesthetic is a Optician, dispensing. Once patient was asleep she was endotracheally intubated by the nurse anesthetist. At this point her abdomen is prepped with DuraPrep solution and draped in standard fashion. Stab incision created supraumbilically with 11 blade scalpel. A Coker clamp was utilized dissect down to subcuticular tissue and utilized to grasp the anterior abdominal fascia. This is lifted anteriorly a Veress needle is inserted. Saline drop test is utilized confirm intraperitoneal placement and then pneumoperitoneum was initiated. Once sufficient pneumoperitoneum was obtained an 11 mm process or overlapping scope allowing visualization the trocar entering into the peritoneal cavity. At this point the inner cannulas removed laxatives reinserted there is no is a trocar or Veress the placement injury. At this point her major pressure placed the 5 mm  trocar in the epigastrium a final troponin the middle line and a final at her from the right lateral abdominal wall. Patient's placed into a reverse Trendelenburg left lateral decubitus position. The fundus of the gallbladder specimen to the right lobe the liver. The peritoneal dissection is utilized to strip the peritoneum off the infundibulum using a Art gallery manager. This exposed the cystic duct entering into the infundibulum. A window was created behind the cystic duct, 3 endoclips placed proximally one distally and the cystic duct was divided between the distal clips. Similarly the cystic artery is identified a window was created behind the cystic artery 2 endoclips placed proximally one distally and the cystic artery was divided 2 most distal clips. At this point a letter cautery utilized dissect the gallbladder free from the gallbladder fossa. The skull or free is placed into an Endo Catch bag and placed up and over the right lobe the liver. In order to insert the Endo Catch bag and exchange the 10 mm scope for a 5 mm scope. At this point the gallbladder fossa was inspected. Hemostasis was excellent and the endoclips were noted be in excellent position with no SA bleeding or bile leak. At this time return my attention to performing the liver biopsy.  A stab incision was created in the right subcostal margin. A Tru-Cut needle was utilized to maintain any liver biopsy. Several specimens were obtained. The first place on Telfa were sent as permanent specimens to pathology. At this time attention was turned to closure. A Endo Close suture passing device utilized to pass a 2-0 Vicryl suture through the umbilical trocar site. With this suture and placed the gallbladder was retrieved was removed through the umbilical trocar site  and intact Endo Catch bag. The gallbladder is sent as a permanent specimen to pathology. At this point the pneumoperitoneum was evacuated. Trochars removed. The Vicryl sutures secured.  Local anesthetic is instilled. A 4-0 Monocryl utilized or fracture the skin edges at all 4 trocar sites. The skin was washed dried and with moist and dry towel. Benzoin is applied around all incisions. Half-inch Steri-Strips are placed. The drapes removed the patient was allowed to come out of general anesthetic. Patient is transferred to PACU in stable condition. At the conclusion of procedure all instrument, sponge, needle counts correct. Patient tolerated procedure she well.  Complications:  None  EBL:  Normal  Specimen:  Liver biopsy, gallbladder

## 2011-11-01 NOTE — Anesthesia Preprocedure Evaluation (Signed)
Anesthesia Evaluation  Patient identified by MRN, date of birth, ID band Patient awake    Reviewed: Allergy & Precautions, H&P , NPO status , Patient's Chart, lab work & pertinent test results  History of Anesthesia Complications (+) PONV  Airway Mallampati: I TM Distance: >3 FB Neck ROM: full    Dental  (+) Teeth Intact   Pulmonary neg pulmonary ROS,  breath sounds clear to auscultation  Pulmonary exam normal       Cardiovascular negative cardio ROS  Rhythm:Regular Rate:Normal     Neuro/Psych  Headaches, Anxiety negative psych ROS   GI/Hepatic Neg liver ROS, GERD-  Medicated and Controlled,  Endo/Other  Hypothyroidism Morbid obesity  Renal/GU negative Renal ROS  negative genitourinary   Musculoskeletal negative musculoskeletal ROS (+)   Abdominal (+) + obese,   Peds negative pediatric ROS (+)  Hematology negative hematology ROS (+) HIV,   Anesthesia Other Findings   Reproductive/Obstetrics negative OB ROS                           Anesthesia Physical Anesthesia Plan  ASA: II  Anesthesia Plan: General   Post-op Pain Management:    Induction: Intravenous, Rapid sequence and Cricoid pressure planned  Airway Management Planned: Oral ETT  Additional Equipment:   Intra-op Plan:   Post-operative Plan: Extubation in OR  Informed Consent: I have reviewed the patients History and Physical, chart, labs and discussed the procedure including the risks, benefits and alternatives for the proposed anesthesia with the patient or authorized representative who has indicated his/her understanding and acceptance.     Plan Discussed with:   Anesthesia Plan Comments:         Anesthesia Quick Evaluation

## 2011-11-03 ENCOUNTER — Encounter (HOSPITAL_COMMUNITY): Payer: Self-pay | Admitting: General Surgery

## 2011-12-08 ENCOUNTER — Encounter (INDEPENDENT_AMBULATORY_CARE_PROVIDER_SITE_OTHER): Payer: Self-pay | Admitting: *Deleted

## 2011-12-20 ENCOUNTER — Ambulatory Visit (INDEPENDENT_AMBULATORY_CARE_PROVIDER_SITE_OTHER): Payer: Managed Care, Other (non HMO) | Admitting: Internal Medicine

## 2012-03-12 ENCOUNTER — Other Ambulatory Visit: Payer: Self-pay | Admitting: Obstetrics and Gynecology

## 2012-03-12 DIAGNOSIS — N644 Mastodynia: Secondary | ICD-10-CM

## 2012-03-18 ENCOUNTER — Ambulatory Visit
Admission: RE | Admit: 2012-03-18 | Discharge: 2012-03-18 | Disposition: A | Discharge status: Home / Self Care | Payer: Managed Care, Other (non HMO) | Source: Ambulatory Visit | Attending: Obstetrics and Gynecology | Admitting: Obstetrics and Gynecology

## 2012-03-18 DIAGNOSIS — N644 Mastodynia: Secondary | ICD-10-CM

## 2012-12-09 ENCOUNTER — Encounter (HOSPITAL_COMMUNITY): Payer: Self-pay | Admitting: Pharmacy Technician

## 2012-12-17 ENCOUNTER — Inpatient Hospital Stay (HOSPITAL_COMMUNITY): Admission: RE | Admit: 2012-12-17 | Payer: Managed Care, Other (non HMO) | Source: Ambulatory Visit

## 2012-12-20 ENCOUNTER — Encounter (HOSPITAL_COMMUNITY): Payer: Self-pay | Admitting: Pharmacist

## 2012-12-23 ENCOUNTER — Encounter (HOSPITAL_COMMUNITY)
Admission: RE | Admit: 2012-12-23 | Discharge: 2012-12-23 | Disposition: A | Discharge status: Home / Self Care | Payer: BC Managed Care – PPO | Source: Ambulatory Visit | Attending: Obstetrics and Gynecology | Admitting: Obstetrics and Gynecology

## 2012-12-23 ENCOUNTER — Encounter (HOSPITAL_COMMUNITY): Payer: Self-pay

## 2012-12-23 DIAGNOSIS — Z01812 Encounter for preprocedural laboratory examination: Secondary | ICD-10-CM | POA: Insufficient documentation

## 2012-12-23 DIAGNOSIS — Z01818 Encounter for other preprocedural examination: Secondary | ICD-10-CM | POA: Insufficient documentation

## 2012-12-23 HISTORY — DX: Cardiac arrhythmia, unspecified: I49.9

## 2012-12-23 HISTORY — DX: Shortness of breath: R06.02

## 2012-12-23 LAB — CBC
HCT: 36.4 % (ref 36.0–46.0)
Hemoglobin: 11.5 g/dL — ABNORMAL LOW (ref 12.0–15.0)
MCH: 25.1 pg — ABNORMAL LOW (ref 26.0–34.0)
MCHC: 31.6 g/dL (ref 30.0–36.0)
MCV: 79.5 fL (ref 78.0–100.0)
Platelets: 332 10*3/uL (ref 150–400)
RBC: 4.58 MIL/uL (ref 3.87–5.11)
RDW: 16.4 % — ABNORMAL HIGH (ref 11.5–15.5)
WBC: 9.1 10*3/uL (ref 4.0–10.5)

## 2012-12-23 LAB — BASIC METABOLIC PANEL
BUN: 11 mg/dL (ref 6–23)
CO2: 28 mEq/L (ref 19–32)
Calcium: 9.8 mg/dL (ref 8.4–10.5)
Chloride: 103 mEq/L (ref 96–112)
Creatinine, Ser: 0.76 mg/dL (ref 0.50–1.10)
GFR calc Af Amer: >90 mL/min (ref >90)
GFR calc non Af Amer: >90 mL/min (ref >90)
Glucose, Bld: 92 mg/dL (ref 70–99)
Potassium: 4.1 mEq/L (ref 3.5–5.1)
Sodium: 138 mEq/L (ref 135–145)

## 2012-12-23 NOTE — Patient Instructions (Addendum)
Your procedure is scheduled on:12/27/12  Enter through the Main Entrance at :6am Pick up desk phone and dial [phone] and inform us of your arrival.  Please call [phone] if you have any problems the morning of surgery.  Remember: Do not eat or drink after midnight:Thursday   Take these meds the morning of surgery with a sip of water:thyroid, xanax  DO NOT wear jewelry, eye make-up, lipstick,body lotion, or dark fingernail polish.   If you are to be admitted after surgery, leave suitcase in car until your room has been assigned. Patients discharged on the day of surgery will not be allowed to drive home.

## 2012-12-27 ENCOUNTER — Encounter (HOSPITAL_COMMUNITY): Admission: RE | Payer: Self-pay | Source: Ambulatory Visit

## 2012-12-27 ENCOUNTER — Ambulatory Visit (HOSPITAL_COMMUNITY)
Admission: RE | Admit: 2012-12-27 | Payer: BC Managed Care – PPO | Source: Ambulatory Visit | Admitting: Obstetrics and Gynecology

## 2012-12-27 SURGERY — DILATION AND CURETTAGE
Anesthesia: Choice

## 2013-08-19 ENCOUNTER — Encounter (HOSPITAL_COMMUNITY): Payer: BC Managed Care – PPO | Admitting: Anesthesiology

## 2013-08-19 ENCOUNTER — Encounter (HOSPITAL_COMMUNITY)
Admission: RE | Disposition: A | Discharge status: Home / Self Care | Payer: Self-pay | Source: Ambulatory Visit | Attending: Obstetrics and Gynecology

## 2013-08-19 ENCOUNTER — Ambulatory Visit (HOSPITAL_COMMUNITY)
Admission: RE | Admit: 2013-08-19 | Discharge: 2013-08-19 | Disposition: A | Discharge status: Home / Self Care | Payer: BC Managed Care – PPO | Source: Ambulatory Visit | Attending: Obstetrics and Gynecology | Admitting: Obstetrics and Gynecology

## 2013-08-19 ENCOUNTER — Encounter (HOSPITAL_COMMUNITY): Payer: Self-pay | Admitting: Anesthesiology

## 2013-08-19 ENCOUNTER — Ambulatory Visit (HOSPITAL_COMMUNITY): Payer: BC Managed Care – PPO | Admitting: Anesthesiology

## 2013-08-19 DIAGNOSIS — N925 Other specified irregular menstruation: Secondary | ICD-10-CM | POA: Insufficient documentation

## 2013-08-19 DIAGNOSIS — N938 Other specified abnormal uterine and vaginal bleeding: Secondary | ICD-10-CM | POA: Diagnosis present

## 2013-08-19 DIAGNOSIS — R9389 Abnormal findings on diagnostic imaging of other specified body structures: Secondary | ICD-10-CM | POA: Diagnosis not present

## 2013-08-19 DIAGNOSIS — N8501 Benign endometrial hyperplasia: Secondary | ICD-10-CM | POA: Insufficient documentation

## 2013-08-19 DIAGNOSIS — N949 Unspecified condition associated with female genital organs and menstrual cycle: Secondary | ICD-10-CM | POA: Insufficient documentation

## 2013-08-19 DIAGNOSIS — E282 Polycystic ovarian syndrome: Secondary | ICD-10-CM | POA: Insufficient documentation

## 2013-08-19 DIAGNOSIS — K5900 Constipation, unspecified: Secondary | ICD-10-CM

## 2013-08-19 HISTORY — PX: DILATION AND CURETTAGE OF UTERUS: SHX78

## 2013-08-19 LAB — HCG, SERUM, QUALITATIVE: Preg, Serum: NEGATIVE

## 2013-08-19 LAB — CBC
HCT: 40.6 % (ref 36.0–46.0)
Hemoglobin: 13.3 g/dL (ref 12.0–15.0)
MCH: 26.8 pg (ref 26.0–34.0)
MCHC: 32.8 g/dL (ref 30.0–36.0)
MCV: 81.9 fL (ref 78.0–100.0)
Platelets: 394 10*3/uL (ref 150–400)
RBC: 4.96 MIL/uL (ref 3.87–5.11)
RDW: 14.7 % (ref 11.5–15.5)
WBC: 8.8 10*3/uL (ref 4.0–10.5)

## 2013-08-19 SURGERY — DILATION AND CURETTAGE
Anesthesia: Monitor Anesthesia Care

## 2013-08-19 MED ORDER — KETOROLAC TROMETHAMINE 30 MG/ML IJ SOLN
INTRAMUSCULAR | Status: DC | PRN
  Administered 2013-08-19: 30 mg via INTRAVENOUS

## 2013-08-19 MED ORDER — LIDOCAINE HCL (CARDIAC) 20 MG/ML IV SOLN
INTRAVENOUS | Status: DC | PRN
  Administered 2013-08-19: 30 mg via INTRAVENOUS

## 2013-08-19 MED ORDER — PROPOFOL 10 MG/ML IV EMUL
INTRAVENOUS | Status: AC
  Filled 2013-08-19: qty 20

## 2013-08-19 MED ORDER — ONDANSETRON HCL 4 MG/2ML IJ SOLN
INTRAMUSCULAR | Status: AC
  Filled 2013-08-19: qty 2

## 2013-08-19 MED ORDER — DEXAMETHASONE SODIUM PHOSPHATE 10 MG/ML IJ SOLN: INTRAMUSCULAR | Status: DC | PRN

## 2013-08-19 MED ORDER — LIDOCAINE HCL (CARDIAC) 20 MG/ML IV SOLN
INTRAVENOUS | Status: AC
  Filled 2013-08-19: qty 5

## 2013-08-19 MED ORDER — FENTANYL CITRATE 0.05 MG/ML IJ SOLN: 25.0000 ug | INTRAMUSCULAR | Status: DC | PRN

## 2013-08-19 MED ORDER — MIDAZOLAM HCL 2 MG/2ML IJ SOLN
INTRAMUSCULAR | Status: AC
  Filled 2013-08-19: qty 2

## 2013-08-19 MED ORDER — SCOPOLAMINE 1 MG/3DAYS TD PT72
1.0000 | MEDICATED_PATCH | TRANSDERMAL | Status: DC
  Administered 2013-08-19: 1.5 mg via TRANSDERMAL

## 2013-08-19 MED ORDER — ONDANSETRON HCL 4 MG/2ML IJ SOLN
INTRAMUSCULAR | Status: DC | PRN
  Administered 2013-08-19: 4 mg via INTRAVENOUS

## 2013-08-19 MED ORDER — MIDAZOLAM HCL 2 MG/2ML IJ SOLN
INTRAMUSCULAR | Status: DC | PRN
  Administered 2013-08-19: 1 mg via INTRAVENOUS
  Administered 2013-08-19: 2 mg via INTRAVENOUS

## 2013-08-19 MED ORDER — LACTATED RINGERS IV SOLN
INTRAVENOUS | Status: DC
  Administered 2013-08-19: 12:00:00 via INTRAVENOUS

## 2013-08-19 MED ORDER — FENTANYL CITRATE 0.05 MG/ML IJ SOLN
INTRAMUSCULAR | Status: AC
  Filled 2013-08-19: qty 2

## 2013-08-19 MED ORDER — DEXTROSE 5 % IV SOLN
2.0000 g | Freq: Once | INTRAVENOUS | Status: AC
  Administered 2013-08-19: 2 g via INTRAVENOUS
  Filled 2013-08-19: qty 2

## 2013-08-19 MED ORDER — PROPOFOL 10 MG/ML IV EMUL
INTRAVENOUS | Status: DC | PRN
  Administered 2013-08-19 (×3): 10 mg via INTRAVENOUS
  Administered 2013-08-19: 20 mg via INTRAVENOUS
  Administered 2013-08-19 (×3): 10 mg via INTRAVENOUS

## 2013-08-19 MED ORDER — FENTANYL CITRATE 0.05 MG/ML IJ SOLN
INTRAMUSCULAR | Status: DC | PRN
  Administered 2013-08-19 (×2): 50 ug via INTRAVENOUS

## 2013-08-19 MED ORDER — LACTATED RINGERS IV SOLN
INTRAVENOUS | Status: DC
  Administered 2013-08-19 (×2): via INTRAVENOUS

## 2013-08-19 MED ORDER — LIDOCAINE HCL 1 % IJ SOLN
INTRAMUSCULAR | Status: AC
  Filled 2013-08-19: qty 20

## 2013-08-19 MED ORDER — KETOROLAC TROMETHAMINE 30 MG/ML IJ SOLN
INTRAMUSCULAR | Status: AC
  Filled 2013-08-19: qty 1

## 2013-08-19 MED ORDER — SCOPOLAMINE 1 MG/3DAYS TD PT72
MEDICATED_PATCH | TRANSDERMAL | Status: AC
  Administered 2013-08-19: 1.5 mg via TRANSDERMAL
  Filled 2013-08-19: qty 1

## 2013-08-19 MED ORDER — LIDOCAINE HCL 1 % IJ SOLN
INTRAMUSCULAR | Status: DC | PRN
  Administered 2013-08-19: 20 mL

## 2013-08-19 SURGICAL SUPPLY — 15 items
CATH ROBINSON RED A/P 16FR (CATHETERS) ×2 IMPLANT
CLOTH BEACON ORANGE TIMEOUT ST (SAFETY) ×2 IMPLANT
CONTAINER PREFILL 10% NBF 60ML (FORM) ×4 IMPLANT
DRSG TELFA 3X8 NADH (GAUZE/BANDAGES/DRESSINGS) ×2 IMPLANT
GLOVE BIO SURGEON STRL SZ 6.5 (GLOVE) ×4 IMPLANT
GOWN STRL REIN XL XLG (GOWN DISPOSABLE) ×4 IMPLANT
NDL SPNL 22GX3.5 QUINCKE BK (NEEDLE) IMPLANT
NEEDLE SPNL 22GX3.5 QUINCKE BK (NEEDLE) IMPLANT
PACK VAGINAL MINOR WOMEN LF (CUSTOM PROCEDURE TRAY) ×2 IMPLANT
PAD DRESSING TELFA 3X8 NADH (GAUZE/BANDAGES/DRESSINGS) ×1 IMPLANT
PAD OB MATERNITY 4.3X12.25 (PERSONAL CARE ITEMS) ×2 IMPLANT
PAD PREP 24X48 CUFFED NSTRL (MISCELLANEOUS) ×2 IMPLANT
SYR CONTROL 10ML LL (SYRINGE) IMPLANT
TOWEL OR 17X24 6PK STRL BLUE (TOWEL DISPOSABLE) ×4 IMPLANT
WATER STERILE IRR 1000ML POUR (IV SOLUTION) ×2 IMPLANT

## 2013-08-19 NOTE — Transfer of Care (Signed)
Immediate Anesthesia Transfer of Care Note  Patient: [name]  Procedure(s) Performed: Procedure(s): DILATATION AND CURETTAGE (N/A)  Patient Location: PACU  Anesthesia Type:MAC  Level of Consciousness: awake, alert , sedated and patient cooperative  Airway & Oxygen Therapy: Patient Spontanous Breathing  Post-op Assessment: Report given to PACU RN and Post -op Vital signs reviewed and stable  Post vital signs: Reviewed and stable  Complications: No apparent anesthesia complications

## 2013-08-19 NOTE — Anesthesia Preprocedure Evaluation (Signed)
Anesthesia Evaluation  Patient identified by MRN, date of birth, ID band Patient awake    Reviewed: Allergy & Precautions, H&P , NPO status , Patient's Chart, lab work & pertinent test results, reviewed documented beta blocker date and time   Airway Mallampati: II TM Distance: >3 FB Neck ROM: full    Dental no notable dental hx. (+) Teeth Intact   Pulmonary neg pulmonary ROS, former smoker,  breath sounds clear to auscultation  Pulmonary exam normal       Cardiovascular hypertension, negative cardio ROS  Rhythm:regular Rate:Normal     Neuro/Psych negative psych ROS   GI/Hepatic Neg liver ROS, GERD-  Medicated and Controlled,  Endo/Other  Hypothyroidism Morbid obesity  Renal/GU negative Renal ROS  negative genitourinary   Musculoskeletal negative musculoskeletal ROS (+)   Abdominal (+) + obese,   Peds negative pediatric ROS (+)  Hematology negative hematology ROS (+)   Anesthesia Other Findings   Reproductive/Obstetrics negative OB ROS                           Anesthesia Physical  Anesthesia Plan  ASA: III  Anesthesia Plan: MAC   Post-op Pain Management:    Induction: Intravenous  Airway Management Planned: LMA, Mask and Natural Airway  Additional Equipment:   Intra-op Plan:   Post-operative Plan:   Informed Consent: I have reviewed the patients History and Physical, chart, labs and discussed the procedure including the risks, benefits and alternatives for the proposed anesthesia with the patient or authorized representative who has indicated his/her understanding and acceptance.   Dental Advisory Given  Plan Discussed with: CRNA and Surgeon  Anesthesia Plan Comments: (Discussed sedation and potential to need to place airway or ETT if warranted by clinical changes intra-operatively. We will start procedure as MAC.)       Anesthesia Quick Evaluation

## 2013-08-19 NOTE — Progress Notes (Signed)
H and P on the chart No changes Will proceed with D and C Consent signed

## 2013-08-19 NOTE — H&P (Signed)
37 year old female with PCOS and history of chronic anovulation. She has had dysfunctional uterine bleeding. Ultrasound in my office showed thickened endometrium. History of endometrial polyps.  Medical history PCOS  Surgical history D and C  NKDA  Afebrile VSS General alert and oriented Lung CTAB Car RRR Abdomen obese soft non tender  IMPRESSION: PCOS Dysfunctional uterine bleeding  PLAN: D and C Risks reviewed Consent signed.

## 2013-08-19 NOTE — Anesthesia Postprocedure Evaluation (Signed)
  Anesthesia Post Note  Patient: [name]  Procedure(s) Performed: Procedure(s) (LRB): DILATATION AND CURETTAGE (N/A)  Anesthesia type: MAC  Patient location: PACU  Post pain: Pain level controlled  Post assessment: Post-op Vital signs reviewed  Last Vitals:  Filed Vitals:   08/19/13 1415  BP: 120/68  Pulse: 75  Temp: 36.7 C  Resp: 18    Post vital signs: Reviewed  Level of consciousness: sedated  Complications: No apparent anesthesia complications

## 2013-08-19 NOTE — Op Note (Signed)
NAMESHENELL, ROGALSKI          ACCOUNT NO.:  [id number]  MEDICAL RECORD NO.:  [id number]  LOCATION:  [facility]                          FACILITY:  [facility]  PHYSICIAN:  Ara Mano L. Keevon Henney, M.D.DATE OF BIRTH:  [birth year]  DATE OF PROCEDURE:  08/19/2013 DATE OF DISCHARGE:  08/19/2013                              OPERATIVE REPORT   PREOPERATIVE DIAGNOSIS:  Abnormal uterine bleeding.  POSTOP DIAGNOSIS:  Abnormal uterine bleeding.  PROCEDURE:  Dilatation and curettage.  SURGEON:  Rika Daughdrill L. Vincente Poli, M.D.  ANESTHESIA:  MAC with paracervical.  ESTIMATED BLOOD LOSS:  Minimal.  COMPLICATIONS:  None.  DRAINS:  None.  PATHOLOGY:  Uterine curettings.  DESCRIPTION OF PROCEDURE:  The patient was taken to the operating room. Her anesthesia was administered.  She was prepped and draped.  In and out catheter was used to empty the bladder.  The cervix was grasped with a tenaculum.  A paracervical block was performed in standard fashion. The cervical internal os was gently dilated using Pratt dilators and a sharp curette was inserted 3 times and a thorough uterine curettage was performed with retrieval of tissue.  All tissue was sent to pathology for analysis.  At the end of the procedure, the all instruments removed. All sponge, lap, instrument counts were correct x2.  The patient went to recovery room in stable condition.     Jasmine Maceachern L. Vincente Poli, M.D.     Florestine Avers  D:  08/19/2013  T:  08/19/2013  Job:  [id number]

## 2013-08-19 NOTE — Brief Op Note (Signed)
08/19/2013  2:04 PM  PATIENT:  [name]  37 y.o. female  PRE-OPERATIVE DIAGNOSIS:  IRREGULAR BLEEDING  POST-OPERATIVE DIAGNOSIS:  IRREGULAR BLEEDING  PROCEDURE:  Procedure(s): DILATATION AND CURETTAGE (N/A)  SURGEON:  Surgeon(s) and Role:    * Jeani Hawking, MD - Primary  PHYSICIAN ASSISTANT:   ASSISTANTS: none   ANESTHESIA:   MAC  EBL:  Total I/O In: -  Out: 200 [Urine:200]  BLOOD ADMINISTERED:none  DRAINS: none   LOCAL MEDICATIONS USED:  LIDOCAINE   SPECIMEN:  Source of Specimen:  uterine currettings  DISPOSITION OF SPECIMEN:  PATHOLOGY  COUNTS:  YES  TOURNIQUET:  * No tourniquets in log *  DICTATION: .Other Dictation: Dictation Number [id number]  PLAN OF CARE: Admit to inpatient   PATIENT DISPOSITION:  PACU - hemodynamically stable.   Delay start of Pharmacological VTE agent (>24hrs) due to surgical blood loss or risk of bleeding: not applicable

## 2013-08-20 ENCOUNTER — Encounter (HOSPITAL_COMMUNITY): Payer: Self-pay | Admitting: Obstetrics and Gynecology

## 2013-09-18 ENCOUNTER — Ambulatory Visit (INDEPENDENT_AMBULATORY_CARE_PROVIDER_SITE_OTHER): Payer: BC Managed Care – PPO | Admitting: Physician Assistant

## 2013-09-18 ENCOUNTER — Encounter: Payer: Self-pay | Admitting: Physician Assistant

## 2013-09-18 VITALS — BP 124/82 | HR 91 | Ht 64.0 in | Wt 342.0 lb

## 2013-09-18 DIAGNOSIS — R079 Chest pain, unspecified: Secondary | ICD-10-CM

## 2013-09-18 MED ORDER — METOPROLOL TARTRATE 25 MG PO TABS: 25.0000 mg | ORAL_TABLET | Freq: Two times a day (BID) | ORAL | Status: DC

## 2013-09-18 MED ORDER — NITROGLYCERIN 0.4 MG SL SUBL: 0.4000 mg | SUBLINGUAL_TABLET | SUBLINGUAL | Status: DC | PRN

## 2013-09-18 NOTE — Patient Instructions (Signed)
1.  Start taking lopressor 25mg  twice daily.  Call if you experience dizziness. 2.  Cardiac CT will be scheduled 3.  Nitroglycerin was prescribed.

## 2013-09-18 NOTE — Assessment & Plan Note (Signed)
She is agreeable to referral for medical nutrition therapy.

## 2013-09-18 NOTE — Progress Notes (Signed)
Date:  09/18/2013   ID:  Solon Palm, DOB [birth year], MRN [id number]  PCP:  Leonides Grills, MD  Primary Cardiologist:  Debara Pickett     History of Present Illness: [name] is a 38 y.o. morbidly obese female with anxiety and panic disorder, panic attacks only when she is driving, hypothyroidism secondary to a history of thyroid cancer and thyroidectomy, as well as type 2 diabetes, mild hypertension and tobacco abuse.  She quit in 2006 but smoked 1.5 packs/day since age 30.  Her father had an MI at age 21 and died from cardiac problems at 67.  She reports CP which started yesterday, 6-7/10 and described as squeezing.  It comes and goes fairly quickly and is associated with a "clammy" feeling.     The patient currently denies nausea, vomiting, fever, shortness of breath, orthopnea, dizziness, PND, cough, congestion, abdominal pain, hematochezia, melena, lower extremity edema.  Wt Readings from Last 3 Encounters:  09/18/13 342 lb (155.13 kg)  08/19/13 335 lb (151.955 kg)  08/19/13 335 lb (151.955 kg)     Past Medical History  Diagnosis Date  . Thyroid disease   . Hypothyroidism   . GERD (gastroesophageal reflux disease)   . Headache(784.0)   . Cancer 2010    thyroidectomy  . Hypertension     no meds currently  . Anxiety     panic attacks  . Dysrhythmia     no meds currently- palpitations assoc with panic disorder or thyroid disease  . Shortness of breath     on exertion- due to weight  . PONV (postoperative nausea and vomiting)     Current Outpatient Prescriptions  Medication Sig Dispense Refill  . ALPRAZolam (XANAX) 1 MG tablet Take 1 mg by mouth 3 (three) times daily as needed. For anxiety      . buPROPion (WELLBUTRIN SR) 100 MG 12 hr tablet Take 100 mg by mouth 2 (two) times daily.      Marland Kitchen ibuprofen (ADVIL,MOTRIN) 200 MG tablet Take 400 mg by mouth every 6 (six) hours as needed for pain.      Marland Kitchen levothyroxine (SYNTHROID, LEVOTHROID) 137 MCG tablet Take 137  mcg by mouth 2 (two) times daily.      . metFORMIN (GLUMETZA) 1000 MG (MOD) 24 hr tablet Take 1,000 mg by mouth 2 (two) times daily with a meal.      . pantoprazole (PROTONIX) 40 MG tablet Take 40 mg by mouth daily. For indigestion      . Prenatal Vit-Fe Fumarate-FA (PRENATAL MULTIVITAMIN) TABS tablet Take 1 tablet by mouth daily at 12 noon.      . sertraline (ZOLOFT) 100 MG tablet Take 100 mg by mouth daily.      . metoprolol tartrate (LOPRESSOR) 25 MG tablet Take 1 tablet (25 mg total) by mouth 2 (two) times daily.  30 tablet  0  . nitroGLYCERIN (NITROSTAT) 0.4 MG SL tablet Place 1 tablet (0.4 mg total) under the tongue every 5 (five) minutes as needed for chest pain.  25 tablet  3   No current facility-administered medications for this visit.    Allergies:   No Known Allergies  Social History:  The patient  reports that she quit smoking about 8 years ago. Her smoking use included Cigarettes. She has a 5 pack-year smoking history. She has never used smokeless tobacco. She reports that she drinks alcohol. She reports that she does not use illicit drugs.   Family history:   Family History  Problem Relation Age of Onset  . Anesthesia problems Neg Hx   . Hypotension Neg Hx   . Malignant hyperthermia Neg Hx   . Pseudochol deficiency Neg Hx   . Heart attack Father     Age 62    ROS:  Please see the history of present illness.  All other systems reviewed and negative.   PHYSICAL EXAM: VS:  BP 124/82  Pulse 91  Ht 5\' 4"  (1.626 m)  Wt 342 lb (155.13 kg)  BMI 58.68 kg/m2  LMP 08/18/2013 Morbidly obese, well developed, in no acute distress HEENT: Pupils are equal round react to light accommodation extraocular movements are intact.  Neck: no JVDNo cervical lymphadenopathy. Cardiac: Regular rate and rhythm without murmurs rubs or gallops. Lungs:  clear to auscultation bilaterally, no wheezing, rhonchi or rales Ext: no lower extremity edema.  2+ radial and dorsalis pedis pulses. Skin:  warm and dry Neuro:  Grossly normal  EKG:  NSR, rate 91  ASSESSMENT AND PLAN:  Problem List Items Addressed This Visit   Chest pain - Primary     The patient's chest pain sounds atypical, not worse with exertion, not reproducible.  She does have a family history of MI with her father at age 61 and he died at age 15.  She clearly seems nervous about that.   I will order a Cardiac CT.  Lopressor to slow her HR a little.  NTG SL if she has more prolonged CP.      Relevant Orders      EKG 12-Lead      CT Coronary Morp W/Cta Cor W/Score W/Ca W/Cm &/Or Wo/Cm   Obesity, morbid, BMI 50 or higher     She is agreeable to referral for medical nutrition therapy.    Relevant Medications      metFORMIN (GLUMETZA) 1000 MG (MOD) 24 hr tablet

## 2013-09-18 NOTE — Assessment & Plan Note (Signed)
The patient's chest pain sounds atypical, not worse with exertion, not reproducible.  She does have a family history of MI with her father at age 36 and he died at age 61.  She clearly seems nervous about that.   I will order a Cardiac CT.  Lopressor to slow her HR a little.  NTG SL if she has more prolonged CP.

## 2013-09-30 ENCOUNTER — Other Ambulatory Visit: Payer: Self-pay | Admitting: Physician Assistant

## 2013-09-30 DIAGNOSIS — R079 Chest pain, unspecified: Secondary | ICD-10-CM

## 2013-10-07 ENCOUNTER — Encounter: Payer: Self-pay | Admitting: Physician Assistant

## 2013-10-07 ENCOUNTER — Other Ambulatory Visit: Payer: Self-pay | Admitting: *Deleted

## 2013-10-07 DIAGNOSIS — R079 Chest pain, unspecified: Secondary | ICD-10-CM

## 2013-10-09 ENCOUNTER — Ambulatory Visit (HOSPITAL_BASED_OUTPATIENT_CLINIC_OR_DEPARTMENT_OTHER): Payer: BC Managed Care – PPO | Attending: *Deleted | Admitting: Radiology

## 2013-10-09 VITALS — Ht 64.0 in | Wt 335.0 lb

## 2013-10-09 DIAGNOSIS — G4733 Obstructive sleep apnea (adult) (pediatric): Secondary | ICD-10-CM | POA: Insufficient documentation

## 2013-10-09 DIAGNOSIS — G4761 Periodic limb movement disorder: Secondary | ICD-10-CM | POA: Insufficient documentation

## 2013-10-09 DIAGNOSIS — R0683 Snoring: Secondary | ICD-10-CM

## 2013-10-11 DIAGNOSIS — R0609 Other forms of dyspnea: Secondary | ICD-10-CM

## 2013-10-11 DIAGNOSIS — R0989 Other specified symptoms and signs involving the circulatory and respiratory systems: Secondary | ICD-10-CM

## 2013-10-11 NOTE — Sleep Study (Signed)
   NAME: [name] DATE OF BIRTH:  04-01-1976 MEDICAL RECORD NUMBER [id number]  LOCATION: [facility]  PHYSICIAN: Mieke Brinley D  DATE OF STUDY: 10/09/2013  SLEEP STUDY TYPE: Nocturnal Polysomnogram               REFERRING PHYSICIAN: Shelbie Hutching, MD  INDICATION FOR STUDY: Hypersomnia with sleep apnea  EPWORTH SLEEPINESS SCORE:   13/24 HEIGHT: 5\' 4"  (162.6 cm)  WEIGHT: 335 lb (151.955 kg)    Body mass index is 57.47 kg/(m^2).  NECK SIZE: 17 in.  MEDICATIONS: Charted for review  SLEEP ARCHITECTURE: Total sleep time 292 minutes with sleep efficiency 78.9%. Stage I was 11%, stage II 54.3%, stage III 20.4%, REM 14.4% of total sleep time. Sleep latency 48.5 minutes, REM latency 279 minutes, awake after sleep onset 30 minutes, arousal index 38.4. Bedtime medications: Metformin  RESPIRATORY DATA: Apnea hypopnea index (AHI) 5.3 per hour. 26 events scored including one obstructive apnea and 25 hypopneas. Most events were while supine. REM AHI 2.9 per hour. There were not enough events to permit application of split protocol CPAP titration.  OXYGEN DATA: Mild snoring with oxygen desaturation to a nadir of 80% and mean oxygen saturation through the study at 94.1% on room air  CARDIAC DATA: Sinus rhythm with PACs  MOVEMENT/PARASOMNIA: A total of 17 limb jerks were counted of which 10 were associated with arousals or awakenings for periodic limb movement with arousal index of 2.1 per hour. No bathroom trips.  IMPRESSION/ RECOMMENDATION:   1) Minimal obstructive sleep apnea/hypopneas syndrome, AHI 5.3 per hour with supine events. The normal range for adults is an AHI from 0-5 events per hour. Mild snoring with oxygen desaturation to a nadir of 88% and mean oxygen saturation through the study of 94.1% on room air. 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 A total of 17 limb jerks were counted of which 10 were associated with arousals or awakenings for  periodic limb movement with arousal index of 2.1 per hour.    Signed Baird Lyons M.D. Deneise Lever Diplomate, American Board of Sleep Medicine  ELECTRONICALLY SIGNED ON:  10/11/2013, 12:43 PM [place] PH: [phone]   FX: [phone] [place]

## 2013-10-16 ENCOUNTER — Other Ambulatory Visit: Payer: Self-pay | Admitting: Obstetrics and Gynecology

## 2013-10-22 ENCOUNTER — Encounter (HOSPITAL_COMMUNITY): Payer: BC Managed Care – PPO

## 2013-10-23 ENCOUNTER — Encounter (HOSPITAL_COMMUNITY): Payer: BC Managed Care – PPO

## 2013-11-02 ENCOUNTER — Encounter (HOSPITAL_BASED_OUTPATIENT_CLINIC_OR_DEPARTMENT_OTHER): Payer: BC Managed Care – PPO

## 2013-11-04 ENCOUNTER — Ambulatory Visit (HOSPITAL_COMMUNITY)
Admission: RE | Admit: 2013-11-04 | Discharge: 2013-11-04 | Disposition: A | Discharge status: Home / Self Care | Payer: BC Managed Care – PPO | Source: Ambulatory Visit | Attending: Cardiovascular Disease | Admitting: Cardiovascular Disease

## 2013-11-04 DIAGNOSIS — R079 Chest pain, unspecified: Secondary | ICD-10-CM | POA: Insufficient documentation

## 2013-11-04 MED ORDER — TECHNETIUM TC 99M SESTAMIBI GENERIC - CARDIOLITE
30.0000 | Freq: Once | INTRAVENOUS | Status: AC | PRN
  Administered 2013-11-04: 30 via INTRAVENOUS

## 2013-11-04 MED ORDER — REGADENOSON 0.4 MG/5ML IV SOLN
0.4000 mg | Freq: Once | INTRAVENOUS | Status: AC
  Administered 2013-11-04: 0.4 mg via INTRAVENOUS

## 2013-11-04 NOTE — Procedures (Addendum)
[facility] [address] [place] [address] [phone]  Cardiology Nuclear Med Study  [name] is a 38 y.o. female     MRN : [id number]     DOB: [birth year]  Procedure Date: 11/04/2013  Nuclear Med Background Indication for Stress Test:  Evaluation for Ischemia and Surgical Clearance History:  No prior cardiac or respiratory history reported. Cardiac Risk Factors: Family History - CAD, History of Smoking, Hypertension, Lipids, NIDDM and Obesity  Symptoms:  Chest Pain, Dizziness, DOE, Fatigue, Light-Headedness, Palpitations and SOB   Nuclear Pre-Procedure Caffeine/Decaff Intake:  1:00am NPO After: 11am   IV Site: R Hand  IV 0.9% NS with Angio Cath:  22g  Chest Size (in):  n/a IV Started by: Azucena Cecil, RN  Height: 5\' 4"  (1.626 m)  Cup Size: D  BMI:  Body mass index is 58.68 kg/(m^2). Weight:  342 lb (155.13 kg)   Tech Comments:  n/a    Nuclear Med Study 1 or 2 day study: 2 day  Stress Test Type:  Lexiscan  Order Authorizing Provider:  Lyman Bishop, MD   Resting Radionuclide: Technetium 28m Sestamibi  Resting Radionuclide Dose: 31.4 mCi   Stress Radionuclide:  Technetium 4m Sestamibi  Stress Radionuclide Dose: 28.7 mCi           Stress Protocol Rest HR: 91 Stress HR: 133  Rest BP: 130/100 Stress BP: 130/104  Exercise Time (min): n/a METS: n/a   Predicted Max HR: 185 bpm % Max HR: 63.78 bpm Rate Pressure Product: 17228  Dose of Adenosine (mg):  n/a Dose of Lexiscan: 0.4 mg  Dose of Atropine (mg): n/a Dose of Dobutamine: n/a mcg/kg/min (at max HR)  Stress Test Technologist: Leane Para, CCT Nuclear Technologist: Imagene Riches, CNMT   Rest Procedure:  Myocardial perfusion imaging was performed at rest 45 minutes following the intravenous administration of Technetium 42m Sestamibi. Stress Procedure:  The patient received IV Lexiscan 0.4 mg over 15-seconds.  Technetium 80m Sestamibi injected at  30-seconds.  There were no significant changes with Lexiscan.  Quantitative spect images were obtained after a 45 minute delay.  Transient Ischemic Dilatation (Normal <1.22):  1.10 Lung/Heart Ratio (Normal <0.45):  0.23 QGS EDV:  78 ml QGS ESV:  23 ml LV Ejection Fraction: 71%     Rest ECG: NSR - Normal EKG  Stress ECG: No significant change from baseline ECG  QPS Raw Data Images:  Normal; no motion artifact; normal heart/lung ratio. Stress Images:  Normal homogeneous uptake in all areas of the myocardium. Rest Images:  Normal homogeneous uptake in all areas of the myocardium. Subtraction (SDS):  Normal  Impression Exercise Capacity:  Lexiscan with no exercise. BP Response:  Normal blood pressure response. Clinical Symptoms:  Mild shortness of breath ECG Impression:  No significant ST segment change suggestive of ischemia. Comparison with Prior Nuclear Study: No images to compare  Overall Impression:  Normal stress nuclear study.  LV Wall Motion:  NL LV Function, 71%; NL Wall Motion   [name] A, MD  11/05/2013 5:35 PM

## 2013-11-05 ENCOUNTER — Ambulatory Visit (HOSPITAL_COMMUNITY)
Admission: RE | Admit: 2013-11-05 | Discharge: 2013-11-05 | Disposition: A | Discharge status: Home / Self Care | Payer: BC Managed Care – PPO | Source: Ambulatory Visit | Attending: Cardiovascular Disease | Admitting: Cardiovascular Disease

## 2013-11-05 DIAGNOSIS — R079 Chest pain, unspecified: Secondary | ICD-10-CM | POA: Insufficient documentation

## 2013-11-05 MED ORDER — TECHNETIUM TC 99M SESTAMIBI GENERIC - CARDIOLITE
31.4000 | Freq: Once | INTRAVENOUS | Status: AC | PRN
  Administered 2013-11-05: 31 via INTRAVENOUS

## 2013-11-11 ENCOUNTER — Telehealth: Payer: Self-pay | Admitting: Internal Medicine

## 2013-11-11 NOTE — Telephone Encounter (Signed)
She need clarence for gastric by-pass surgery please. Please call Dr Golda Acre for [phone].

## 2013-11-11 NOTE — Telephone Encounter (Signed)
Message forwarded to Jenna, RN.  

## 2013-11-11 NOTE — Telephone Encounter (Signed)
LM for Lovena Le to fax over clearance form or call back.

## 2013-11-12 NOTE — Telephone Encounter (Signed)
Izora Gala wanted to speak w/ Eliezer Lofts r/t pt's cardiac clearance.  Asked to be forwarded to her voicemail since she and Dr. Debara Pickett are out of the office today.  Message forwarded to Abram, Therapist, sports.

## 2013-11-13 ENCOUNTER — Encounter: Payer: Self-pay | Admitting: Internal Medicine

## 2013-11-13 ENCOUNTER — Telehealth: Payer: Self-pay | Admitting: *Deleted

## 2013-11-13 NOTE — Telephone Encounter (Signed)
Pt called stating that she needs a clearance to state that she is cleared to have surgery and the EKG. Needs to be faxed to Izora Gala [phone] [facility]). She needs this to possibly be done today.  [place]

## 2013-11-13 NOTE — Telephone Encounter (Signed)
Needs cardiac clearance for bariatric surgery at [facility] in April (they do not have a formal clearance form to fax over). Patient just had nuclear stress test. If you can just let me know her risk or compose a letter, I'll handle getting all the other paperwork sent over.   Also will need most recent OV note, EKG, labs, testing.

## 2013-11-13 NOTE — Telephone Encounter (Signed)
Faxed clearance letter, EKG, stress test form, last OV note (requested per Izora Gala - left on this RN's voicemail) to provided fax number. Will notify patient.

## 2013-11-26 ENCOUNTER — Ambulatory Visit (INDEPENDENT_AMBULATORY_CARE_PROVIDER_SITE_OTHER): Payer: Self-pay | Admitting: Surgery

## 2014-02-17 ENCOUNTER — Other Ambulatory Visit (HOSPITAL_COMMUNITY): Payer: Self-pay | Admitting: Endocrinology

## 2014-02-17 DIAGNOSIS — C73 Malignant neoplasm of thyroid gland: Secondary | ICD-10-CM

## 2014-03-09 ENCOUNTER — Encounter (HOSPITAL_COMMUNITY): Payer: BC Managed Care – PPO

## 2014-03-09 ENCOUNTER — Encounter (HOSPITAL_COMMUNITY)
Admission: RE | Admit: 2014-03-09 | Discharge: 2014-03-09 | Disposition: A | Discharge status: Home / Self Care | Payer: BC Managed Care – PPO | Source: Ambulatory Visit | Attending: Endocrinology | Admitting: Endocrinology

## 2014-03-10 ENCOUNTER — Encounter (HOSPITAL_COMMUNITY): Payer: BC Managed Care – PPO

## 2014-03-11 ENCOUNTER — Encounter (HOSPITAL_COMMUNITY): Payer: BC Managed Care – PPO

## 2014-03-13 ENCOUNTER — Encounter (HOSPITAL_COMMUNITY): Payer: BC Managed Care – PPO

## 2014-03-18 ENCOUNTER — Inpatient Hospital Stay (HOSPITAL_COMMUNITY): Payer: BC Managed Care – PPO

## 2014-03-18 ENCOUNTER — Inpatient Hospital Stay (HOSPITAL_COMMUNITY)
Admission: AD | Admit: 2014-03-18 | Discharge: 2014-03-18 | Disposition: A | Discharge status: Home / Self Care | Payer: BC Managed Care – PPO | Source: Ambulatory Visit | Attending: Obstetrics and Gynecology | Admitting: Obstetrics and Gynecology

## 2014-03-18 ENCOUNTER — Encounter (HOSPITAL_COMMUNITY): Payer: Self-pay | Admitting: *Deleted

## 2014-03-18 DIAGNOSIS — N925 Other specified irregular menstruation: Secondary | ICD-10-CM | POA: Insufficient documentation

## 2014-03-18 DIAGNOSIS — R0602 Shortness of breath: Secondary | ICD-10-CM | POA: Insufficient documentation

## 2014-03-18 DIAGNOSIS — E079 Disorder of thyroid, unspecified: Secondary | ICD-10-CM | POA: Insufficient documentation

## 2014-03-18 DIAGNOSIS — N939 Abnormal uterine and vaginal bleeding, unspecified: Secondary | ICD-10-CM

## 2014-03-18 DIAGNOSIS — N949 Unspecified condition associated with female genital organs and menstrual cycle: Secondary | ICD-10-CM | POA: Insufficient documentation

## 2014-03-18 DIAGNOSIS — I10 Essential (primary) hypertension: Secondary | ICD-10-CM | POA: Insufficient documentation

## 2014-03-18 DIAGNOSIS — Z9884 Bariatric surgery status: Secondary | ICD-10-CM

## 2014-03-18 DIAGNOSIS — N938 Other specified abnormal uterine and vaginal bleeding: Secondary | ICD-10-CM | POA: Insufficient documentation

## 2014-03-18 DIAGNOSIS — F411 Generalized anxiety disorder: Secondary | ICD-10-CM | POA: Insufficient documentation

## 2014-03-18 DIAGNOSIS — K219 Gastro-esophageal reflux disease without esophagitis: Secondary | ICD-10-CM | POA: Insufficient documentation

## 2014-03-18 LAB — CBC
HCT: 34.8 % — ABNORMAL LOW (ref 36.0–46.0)
Hemoglobin: 11.3 g/dL — ABNORMAL LOW (ref 12.0–15.0)
MCH: 25.5 pg — ABNORMAL LOW (ref 26.0–34.0)
MCHC: 32.5 g/dL (ref 30.0–36.0)
MCV: 78.4 fL (ref 78.0–100.0)
Platelets: 309 10*3/uL (ref 150–400)
RBC: 4.44 MIL/uL (ref 3.87–5.11)
RDW: 16.7 % — ABNORMAL HIGH (ref 11.5–15.5)
WBC: 7.2 10*3/uL (ref 4.0–10.5)

## 2014-03-18 MED ORDER — ONDANSETRON 8 MG PO TBDP: 8.0000 mg | ORAL_TABLET | Freq: Three times a day (TID) | ORAL | Status: DC | PRN

## 2014-03-18 MED ORDER — ONDANSETRON 8 MG PO TBDP
8.0000 mg | ORAL_TABLET | Freq: Once | ORAL | Status: DC
  Filled 2014-03-18: qty 1

## 2014-03-18 MED ORDER — KETOROLAC TROMETHAMINE 60 MG/2ML IM SOLN
60.0000 mg | Freq: Once | INTRAMUSCULAR | Status: AC
  Administered 2014-03-18: 60 mg via INTRAMUSCULAR
  Filled 2014-03-18: qty 2

## 2014-03-18 MED ORDER — OXYCODONE-ACETAMINOPHEN 5-325 MG PO TABS: 2.0000 | ORAL_TABLET | ORAL | Status: DC | PRN

## 2014-03-18 MED ORDER — OXYCODONE-ACETAMINOPHEN 5-325 MG PO TABS
1.0000 | ORAL_TABLET | Freq: Once | ORAL | Status: DC
  Filled 2014-03-18: qty 1

## 2014-03-18 MED ORDER — OXYCODONE-ACETAMINOPHEN 5-325 MG PO TABS: 1.0000 | ORAL_TABLET | Freq: Once | ORAL | Status: DC

## 2014-03-18 MED ORDER — ONDANSETRON 8 MG PO TBDP: 8.0000 mg | ORAL_TABLET | Freq: Once | ORAL | Status: DC

## 2014-03-18 NOTE — MAU Note (Signed)
Patient presents to MAU via EMS for c/o heavy vaginal bleeding x3 days. States going through 1 pad an hour and is 'gushing'; moderate amount of bright red bleeding noted on pad. Reports lower abdominal pain that is sharp and cramping; 10/10 on pain scale. Also reports generalized weakness and nausea. 20G Locked IV noted on left forearm upon arrival.

## 2014-03-18 NOTE — MAU Note (Signed)
Patient stating has an appointment scheduled today for the [facility] at 2:15 today.

## 2014-03-18 NOTE — MAU Provider Note (Signed)
History     CSN: [id number]  Arrival date and time: 03/18/14 1114   First Provider Initiated Contact with Patient 03/18/14 1137      Chief Complaint  Patient presents with  . Vaginal Bleeding   HPI  Pt is not pregnant and presents with heavy onset of vaginal bleeding.  Pt states she has been changing a pad daily until today when blood started gushing out.  Pt has an appointment at [facility] today, but pt could not wait due to bleeding. Pt has had 2 D&Cs that have not helped her bleeding. Her last D&C was Feb 2015.  Previously HRA was discussed with pt and pt had been anticipating another pregnancy.  Since that time, pt has had a gastric bypass(Spring 2015)  and does not want to pursue pregnancy.    Past Medical History  Diagnosis Date  . Thyroid disease   . Hypothyroidism   . GERD (gastroesophageal reflux disease)   . Headache(784.0)   . Cancer 2010    thyroidectomy  . Hypertension     no meds currently  . Anxiety     panic attacks  . Dysrhythmia     no meds currently- palpitations assoc with panic disorder or thyroid disease  . Shortness of breath     on exertion- due to weight  . PONV (postoperative nausea and vomiting)     Past Surgical History  Procedure Laterality Date  . Tonsillectomy and adenoidectomy    . Thyroidectomy    . Diagnostic laparoscopy  1994    x 2  . Bilateral arm lift surgery  2005  . Tonsillectomy  2011  . Colonscopy    . Svd      x 3  . Removal of iud    . 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 Ovarian cyst removal  '95  . Right tube removed & ovarian cyst '11    . Dilation and curettage of uterus  10/12/2011    Procedure: DILATATION AND CURETTAGE;  Surgeon: Cyril Mourning, MD;  Location: [facility];  Service: Gynecology;  Laterality: N/A;  . Cholecystectomy  11/01/2011    Procedure: LAPAROSCOPIC CHOLECYSTECTOMY;  Surgeon: Donato Heinz, MD;  Location: [facility];  Service: General;  Laterality:  N/A;  . Dilation and curettage of uterus N/A 08/19/2013    Procedure: DILATATION AND CURETTAGE;  Surgeon: Cyril Mourning, MD;  Location: [facility];  Service: Gynecology;  Laterality: N/A;    Family History  Problem Relation Age of Onset  . Anesthesia problems Neg Hx   . Hypotension Neg Hx   . Malignant hyperthermia Neg Hx   . Pseudochol deficiency Neg Hx   . Heart attack Father     Age 29    History  Substance Use Topics  . Smoking status: Former Smoker -- 1.00 packs/day for 5 years    Types: Cigarettes    Quit date: 01/26/2005  . Smokeless tobacco: Never Used  . Alcohol Use: Yes     Comment: occasionally    Allergies: No Known Allergies  Prescriptions prior to admission  Medication Sig Dispense Refill  . ALPRAZolam (XANAX) 1 MG tablet Take 1 mg by mouth 3 (three) times daily as needed. For anxiety      . buPROPion (WELLBUTRIN SR) 100 MG 12 hr tablet Take 100 mg by mouth 2 (two) times daily.      Marland Kitchen CALCIUM PO  Take 1 tablet by mouth daily.      . Cholecalciferol (VITAMIN D PO) Take 1 tablet by mouth daily.      Marland Kitchen levothyroxine (SYNTHROID, LEVOTHROID) 125 MCG tablet Take 250 mcg by mouth daily before breakfast.      . Multiple Vitamin (MULTIVITAMIN WITH MINERALS) TABS tablet Take 1 tablet by mouth daily.      . pantoprazole (PROTONIX) 40 MG tablet Take 40 mg by mouth daily. For indigestion      . sertraline (ZOLOFT) 100 MG tablet Take 100-150 mg by mouth daily.         Review of Systems  Constitutional: Positive for malaise/fatigue. Negative for fever and chills.  Gastrointestinal: Positive for abdominal pain. Negative for nausea, vomiting, diarrhea and constipation.  Genitourinary: Negative for dysuria.  Neurological: Positive for dizziness and weakness.   Physical Exam   Blood pressure 137/80, pulse 80, temperature 97.9 F (36.6 C), temperature source Oral, resp. rate 18, last menstrual period 03/15/2014, SpO2 99.00%.  Physical Exam  Nursing note and vitals  reviewed. Constitutional: She is oriented to person, place, and time. She appears well-developed and well-nourished. No distress.  HENT:  Head: Normocephalic.  Eyes: Pupils are equal, round, and reactive to light.  Neck: Normal range of motion.  Cardiovascular: Normal rate.   Respiratory: Effort normal.  GI: Soft.  Genitourinary:  Large amount of bright red blood in vault with large clots.  Cervix clean, NT; uterus and adnexa difficult to assess due to habitus  Musculoskeletal: Normal range of motion.  Neurological: She is alert and oriented to person, place, and time.  Skin: Skin is warm and dry. There is pallor.  Psychiatric: She has a normal mood and affect.    MAU Course  Procedures US Transvaginal Non-ob  03/18/2014   CLINICAL DATA:  38 year old female with heavy vaginal bleeding for 3 days. Initial encounter.  EXAM: TRANSABDOMINAL AND TRANSVAGINAL ULTRASOUND OF PELVIS  TECHNIQUE: Both transabdominal and transvaginal ultrasound examinations of the pelvis were performed. Transabdominal technique was performed for global imaging of the pelvis including uterus, ovaries, adnexal regions, and pelvic cul-de-sac. It was necessary to proceed with endovaginal exam following the transabdominal exam to visualize the endometrium and ovaries.  COMPARISON:  CT Abdomen and Pelvis 10/25/2011.  FINDINGS: Uterus  Measurements: 11.6 x 5.4 x 6.5 cm. No fibroids or other mass visualized. Incidental nabothian cysts at the cervix.  Endometrium  Thickness: 9 mm.  No focal abnormality visualized.  Right ovary  Measurements: Not visualized despite trans abdominal and transvaginal imaging.  Left ovary  Measurements: Not visualized despite trans abdominal and transvaginal imaging.  Other findings  No free fluid.  IMPRESSION: Negative uterus and endometrium. No pelvic free fluid. Ovaries not identified despite repeated imaging attempts.   Electronically Signed   By: Lars Pinks M.D.   On: 03/18/2014 13:29   US Pelvis  Complete  03/18/2014   CLINICAL DATA:  38 year old female with heavy vaginal bleeding for 3 days. Initial encounter.  EXAM: TRANSABDOMINAL AND TRANSVAGINAL ULTRASOUND OF PELVIS  TECHNIQUE: Both transabdominal and transvaginal ultrasound examinations of the pelvis were performed. Transabdominal technique was performed for global imaging of the pelvis including uterus, ovaries, adnexal regions, and pelvic cul-de-sac. It was necessary to proceed with endovaginal exam following the transabdominal exam to visualize the endometrium and ovaries.  COMPARISON:  CT Abdomen and Pelvis 10/25/2011.  FINDINGS: Uterus  Measurements: 11.6 x 5.4 x 6.5 cm. No fibroids or other mass visualized. Incidental nabothian cysts at the cervix.  Endometrium  Thickness: 9 mm.  No focal abnormality visualized.  Right ovary  Measurements: Not visualized despite trans abdominal and transvaginal imaging.  Left ovary  Measurements: Not visualized despite trans abdominal and transvaginal imaging.  Other findings  No free fluid.  IMPRESSION: Negative uterus and endometrium. No pelvic free fluid. Ovaries not identified despite repeated imaging attempts.   Electronically Signed   By: Lars Pinks M.D.   On: 03/18/2014 13:29  discussed with Dr. Julien Girt- Marshell Levan prescription  will be sent to pt's pharmacy Pt to call for an appointment to be seen in the office tomorrow with Dr. Helane Rima Toradol 60mg  IM given- pharmacy consulted since pt was told not to take NSAIDs due to gastric bypass- pharmacy agreed that it should be safe to give one dose IM Toradol Results for orders placed during the hospital encounter of 03/18/14 (from the past 24 hour(s))  CBC     Status: Abnormal   Collection Time    03/18/14 11:49 AM      Result Value Ref Range   WBC 7.2  4.0 - 10.5 K/uL   RBC 4.44  3.87 - 5.11 MIL/uL   Hemoglobin 11.3 (*) 12.0 - 15.0 g/dL   HCT 34.8 (*) 36.0 - 46.0 %   MCV 78.4  78.0 - 100.0 fL   MCH 25.5 (*) 26.0 - 34.0 pg   MCHC 32.5  30.0 - 36.0 g/dL    RDW 16.7 (*) 11.5 - 15.5 %   Platelets 309  150 - 400 K/uL   Assessment and Plan  Abnormal uterine bleeding F/u with Dr. Helane Rima Rx Percocet and Zofran if needed  [facility] 03/18/2014, 2:49 PM

## 2014-05-18 ENCOUNTER — Encounter (HOSPITAL_COMMUNITY): Payer: BC Managed Care – PPO

## 2014-05-19 ENCOUNTER — Encounter (HOSPITAL_COMMUNITY): Payer: BC Managed Care – PPO

## 2014-05-20 ENCOUNTER — Encounter (HOSPITAL_COMMUNITY): Payer: BC Managed Care – PPO

## 2014-05-22 ENCOUNTER — Encounter (HOSPITAL_COMMUNITY): Payer: BC Managed Care – PPO

## 2014-06-12 ENCOUNTER — Other Ambulatory Visit: Payer: Self-pay

## 2014-06-29 ENCOUNTER — Encounter (HOSPITAL_COMMUNITY): Payer: Self-pay | Admitting: *Deleted

## 2014-08-28 NOTE — L&D Delivery Note (Signed)
Delivery Note At 6:49 PM a viable female was delivered via Vaginal, Spontaneous Delivery (Presentation: Middle Occiput Anterior).  APGAR: 9, 9; weight  .   Placenta status: Intact, Spontaneous.  Cord: 3 vessels with the following complications: None.  Cord pH: not sent  Anesthesia: Epidural  Episiotomy: None Lacerations: None Suture Repair: n/ a Est. Blood Loss (mL): 200cc  Mom to postpartum.  Baby to nursery  [facility] M 04/21/2015, 7:20 PM

## 2014-09-22 LAB — OB RESULTS CONSOLE ABO/RH: RH Type: POSITIVE

## 2014-09-22 LAB — OB RESULTS CONSOLE RPR: RPR: NONREACTIVE

## 2014-09-22 LAB — OB RESULTS CONSOLE ANTIBODY SCREEN: Antibody Screen: NEGATIVE

## 2014-09-22 LAB — OB RESULTS CONSOLE HEPATITIS B SURFACE ANTIGEN: Hepatitis B Surface Ag: NEGATIVE

## 2014-09-22 LAB — OB RESULTS CONSOLE GC/CHLAMYDIA
Chlamydia: NEGATIVE
Gonorrhea: NEGATIVE

## 2014-09-22 LAB — OB RESULTS CONSOLE HIV ANTIBODY (ROUTINE TESTING): HIV: NONREACTIVE

## 2014-09-22 LAB — OB RESULTS CONSOLE RUBELLA ANTIBODY, IGM: Rubella: IMMUNE

## 2014-09-27 ENCOUNTER — Inpatient Hospital Stay (HOSPITAL_COMMUNITY)
Admission: AD | Admit: 2014-09-27 | Discharge: 2014-09-27 | Disposition: A | Discharge status: Home / Self Care | Payer: BLUE CROSS/BLUE SHIELD | Source: Ambulatory Visit | Attending: Obstetrics and Gynecology | Admitting: Obstetrics and Gynecology

## 2014-09-27 ENCOUNTER — Encounter (HOSPITAL_COMMUNITY): Payer: Self-pay | Admitting: *Deleted

## 2014-09-27 DIAGNOSIS — O2241 Hemorrhoids in pregnancy, first trimester: Secondary | ICD-10-CM

## 2014-09-27 DIAGNOSIS — Z3A09 9 weeks gestation of pregnancy: Secondary | ICD-10-CM | POA: Insufficient documentation

## 2014-09-27 DIAGNOSIS — R1031 Right lower quadrant pain: Secondary | ICD-10-CM | POA: Insufficient documentation

## 2014-09-27 MED ORDER — HYDROCORTISONE ACE-PRAMOXINE 1-1 % RE FOAM: 1.0000 | Freq: Two times a day (BID) | RECTAL | Status: DC

## 2014-09-27 MED ORDER — HYDROCORTISONE ACE-PRAMOXINE 1-1 % RE FOAM
1.0000 | Freq: Two times a day (BID) | RECTAL | Status: DC
  Administered 2014-09-27: 1 via RECTAL
  Filled 2014-09-27: qty 10

## 2014-09-27 MED ORDER — LIDOCAINE HCL 2 % EX GEL
Freq: Once | CUTANEOUS | Status: AC
  Administered 2014-09-27: 5 via TOPICAL
  Filled 2014-09-27: qty 5

## 2014-09-27 NOTE — Discharge Instructions (Signed)

## 2014-09-27 NOTE — MAU Note (Signed)
Pt c/o hemmorhoid for the past 2 wks with severe pain for the past few days.  Right lower abd pain that feels worse than menstrual cramps for the past 3 wks.  Denies any vag discharge.  Pt tried sitz bath, preparation H and anusol.

## 2014-09-27 NOTE — MAU Provider Note (Signed)
Chief Complaint: Possible Pregnancy and rectal pressure    First Provider Initiated Contact with Patient 09/27/14 1644     SUBJECTIVE HPI: [name] is a 39 y.o. [id number] at 8.5 weeks by LMP who presents with rectal pain and pressure that she attributes to hemorrhoids. She states she has started prenatal care at [facility]. Normal ultrasound. Reported right lower quadrant pain 3 weeks to RN, but downplayed that pain in conversation with CNM and states she came to maternity admissions because of the hemorrhoid pain. Has been using preparation H without improvement. Had been constipated, but has been having normal bowel movements since she started using a stool softener.  Past Medical History  Diagnosis Date  . Thyroid disease   . Hypothyroidism   . GERD (gastroesophageal reflux disease)   . Headache(784.0)   . Cancer 2010    thyroidectomy  . Hypertension     no meds currently  . Anxiety     panic attacks  . Dysrhythmia     no meds currently- palpitations assoc with panic disorder or thyroid disease  . PONV (postoperative nausea and vomiting)   . Shortness of breath     on exertion- due to weight   OB History  Gravida Para Term Preterm AB SAB TAB Ectopic Multiple Living  5 3 3  2 2    3     # Outcome Date GA Lbr Len/2nd Weight Sex Delivery Anes PTL Lv  5 Term           4 Term           3 Term           2 SAB           1 SAB              Past Surgical History  Procedure Laterality Date  . Tonsillectomy and adenoidectomy    . Thyroidectomy    . Diagnostic laparoscopy  1994    x 2  . Bilateral arm lift surgery  2005  . Tonsillectomy  2011  . Colonscopy    . Svd      x 3  . Removal of iud    . 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 Ovarian cyst removal  '95  . Right tube removed & ovarian cyst '11    . Dilation and curettage of uterus  10/12/2011    Procedure: DILATATION AND CURETTAGE;  Surgeon: Cyril Mourning,  MD;  Location: [facility];  Service: Gynecology;  Laterality: N/A;  . Cholecystectomy  11/01/2011    Procedure: LAPAROSCOPIC CHOLECYSTECTOMY;  Surgeon: Donato Heinz, MD;  Location: [facility];  Service: General;  Laterality: N/A;  . Dilation and curettage of uterus N/A 08/19/2013    Procedure: DILATATION AND CURETTAGE;  Surgeon: Cyril Mourning, MD;  Location: [facility];  Service: Gynecology;  Laterality: N/A;   History   Social History  . Marital Status: Married    Spouse Name: N/A    Number of Children: N/A  . Years of Education: N/A   Occupational History  . Not on file.   Social History Main Topics  . Smoking status: Former Smoker -- 1.00 packs/day for 5 years    Types: Cigarettes    Quit date: 01/26/2005  . Smokeless tobacco: Never Used  . Alcohol Use: Yes     Comment: occasionally, but not while pregnant  .  Drug Use: No  . Sexual Activity: Yes    Birth Control/ Protection: None   Other Topics Concern  . Not on file   Social History Narrative   No current facility-administered medications on file prior to encounter.   Current Outpatient Prescriptions on File Prior to Encounter  Medication Sig Dispense Refill  . Cholecalciferol (VITAMIN D PO) Take 1 tablet by mouth daily.    Marland Kitchen levothyroxine (SYNTHROID, LEVOTHROID) 125 MCG tablet Take 250 mcg by mouth daily before breakfast.    . Multiple Vitamin (MULTIVITAMIN WITH MINERALS) TABS tablet Take 4 tablets by mouth daily.     . sertraline (ZOLOFT) 100 MG tablet Take 100 mg by mouth daily.     . ondansetron (ZOFRAN ODT) 8 MG disintegrating tablet Take 1 tablet (8 mg total) by mouth every 8 (eight) hours as needed for nausea or vomiting. (Patient not taking: Reported on 09/27/2014) 20 tablet 0  . ondansetron (ZOFRAN-ODT) 8 MG disintegrating tablet Take 1 tablet (8 mg total) by mouth once. (Patient not taking: Reported on 09/27/2014) 20 tablet 0  . oxyCODONE-acetaminophen (PERCOCET/ROXICET) 5-325 MG per tablet Take 1 tablet by mouth once.  (Patient not taking: Reported on 09/27/2014) 30 tablet 0  . oxyCODONE-acetaminophen (PERCOCET/ROXICET) 5-325 MG per tablet Take 2 tablets by mouth every 4 (four) hours as needed for severe pain. (Patient not taking: Reported on 09/27/2014) 15 tablet 0   No Known Allergies  ROS: Pertinent items in HPI. Negative for fever, chills, vaginal bleeding, vaginal discharge, urinary complaints, nausea, vomiting or rectal bleeding.  OBJECTIVE Blood pressure 114/78, pulse 88, last menstrual period 07/28/2014. GENERAL: Well-developed, well-nourished female in mild distress. Very uncomfortable-appearing when sitting up.  HEENT: Normocephalic HEART: normal rate RESP: normal effort ABDOMEN: Soft, non-tender. Positive bowel sounds 4. Multiple, well-healed surgical scars. EXTREMITIES: Nontender, no edema NEURO: Alert and oriented SPECULUM EXAM: NEFG, refused. No vaginal bleeding is seen during rectal exam.  BIMANUAL: cervix closed; unable to assess uterine size due to body habitus, no adnexal tenderness or masses RECTAL EXAM: 3 external hemorrhoids noted. Anterior right hemorrhoid firm and exquisitely tender, normal skin color. No obvious, superficial thrombus.  LAB RESULTS No results found for this or any previous visit (from the past 168 hour(s)).   IMAGING No results found.  MAU COURSE Proctofoam, HC foam and lidocaine gel.  Pain improved.  Discussed patient's presenting complaint and exam with Dr. Helane Rima. Will send patient home with prescription for Proctofoam [facility], continue with constipation prophylaxis and refer to general surgeon.   ASSESSMENT 1. Hemorrhoids during pregnancy in first trimester, antepartum     PLAN Discharge home in stable condition per consult with Dr. Helane Rima.  Comfort measures. First trimester precautions. Continue stool softeners. Discouraged opioid use due to constipating effects.     Follow-up Information    Follow up with Rosario Adie., MD.   Specialty:   General Surgery   Why:  for evaluation of hemorrhoids   Contact information:   [place] [address] [phone]       Follow up with [facility].   Why:  As needed in emergencies   Contact information:   [address] [id number] [place] [phone]      Follow up with Cyril Mourning, MD.   Specialty:  Obstetrics and Gynecology   Why:  As scheduled   Contact information:   [place]  [address] [phone]  Medication List    STOP taking these medications        ondansetron 8 MG disintegrating tablet  Commonly known as:  ZOFRAN ODT     oxyCODONE-acetaminophen 5-325 MG per tablet  Commonly known as:  PERCOCET/ROXICET      TAKE these medications        CALCIUM 500 PO  Take 1 tablet by mouth 3 (three) times daily.     hydrocortisone-pramoxine rectal foam  Commonly known as:  PROCTOFOAM-HC  Place 1 applicator rectally 2 (two) times daily.     levothyroxine 125 MCG tablet  Commonly known as:  SYNTHROID, LEVOTHROID  Take 250 mcg by mouth daily before breakfast.     multivitamin with minerals Tabs tablet  Take 4 tablets by mouth daily.     sertraline 100 MG tablet  Commonly known as:  ZOLOFT  Take 100 mg by mouth daily.     VITAMIN D PO  Take 1 tablet by mouth daily.         [place], [place] 09/27/2014  5:52 PM

## 2014-10-07 ENCOUNTER — Other Ambulatory Visit: Payer: Self-pay | Admitting: Obstetrics and Gynecology

## 2014-10-09 LAB — CYTOLOGY - PAP

## 2015-03-02 ENCOUNTER — Encounter (HOSPITAL_COMMUNITY): Payer: Self-pay | Admitting: *Deleted

## 2015-03-02 ENCOUNTER — Inpatient Hospital Stay (HOSPITAL_COMMUNITY)
Admission: AD | Admit: 2015-03-02 | Discharge: 2015-03-02 | Disposition: A | Discharge status: Home / Self Care | Payer: BLUE CROSS/BLUE SHIELD | Source: Ambulatory Visit | Attending: Obstetrics and Gynecology | Admitting: Obstetrics and Gynecology

## 2015-03-02 DIAGNOSIS — D649 Anemia, unspecified: Secondary | ICD-10-CM | POA: Diagnosis not present

## 2015-03-02 MED ORDER — SODIUM CHLORIDE 0.9 % IV SOLN
INTRAVENOUS | Status: DC
  Administered 2015-03-02: 17:00:00 via INTRAVENOUS

## 2015-03-02 MED ORDER — SODIUM CHLORIDE 0.9 % IV SOLN
510.0000 mg | Freq: Once | INTRAVENOUS | Status: AC
  Administered 2015-03-02: 510 mg via INTRAVENOUS
  Filled 2015-03-02: qty 17

## 2015-03-02 NOTE — Discharge Instructions (Signed)
Iron Deficiency Anemia Anemia is a condition in which there are less red blood cells or hemoglobin in the blood than normal. Hemoglobin is the part of red blood cells that carries oxygen. Iron deficiency anemia is anemia caused by too little iron. It is the most common type of anemia. It may leave you tired and short of breath. CAUSES   Lack of iron in the diet.  Poor absorption of iron, as seen with intestinal disorders.  Intestinal bleeding.  Heavy periods. SIGNS AND SYMPTOMS  Mild anemia may not be noticeable. Symptoms may include:  Fatigue.  Headache.  Pale skin.  Weakness.  Tiredness.  Shortness of breath.  Dizziness.  Cold hands and feet.  Fast or irregular heartbeat. DIAGNOSIS  Diagnosis requires a thorough evaluation and physical exam by your health care provider. Blood tests are generally used to confirm iron deficiency anemia. Additional tests may be done to find the underlying cause of your anemia. These may include:  Testing for blood in the stool (fecal occult blood test).  A procedure to see inside the colon and rectum (colonoscopy).  A procedure to see inside the esophagus and stomach (endoscopy). TREATMENT  Iron deficiency anemia is treated by correcting the cause of the deficiency. Treatment may involve:  Adding iron-rich foods to your diet.  Taking iron supplements. Pregnant or breastfeeding women need to take extra iron because their normal diet usually does not provide the required amount.  Taking vitamins. Vitamin C improves the absorption of iron. Your health care provider may recommend that you take your iron tablets with a glass of orange juice or vitamin C supplement.  Medicines to make heavy menstrual flow lighter.  Surgery. HOME CARE INSTRUCTIONS   Take iron as directed by your health care provider.  If you cannot tolerate taking iron supplements by mouth, talk to your health care provider about taking them through a vein  (intravenously) or an injection into a muscle.  For the best iron absorption, iron supplements should be taken on an empty stomach. If you cannot tolerate them on an empty stomach, you may need to take them with food.  Do not drink milk or take antacids at the same time as your iron supplements. Milk and antacids may interfere with the absorption of iron.  Iron supplements can cause constipation. Make sure to include fiber in your diet to prevent constipation. A stool softener may also be recommended.  Take vitamins as directed by your health care provider.  Eat a diet rich in iron. Foods high in iron include liver, lean beef, whole-grain bread, eggs, dried fruit, and dark green leafy vegetables. SEEK IMMEDIATE MEDICAL CARE IF:   You faint. If this happens, do not drive. Call your local emergency services (911 in [place] if no other help is available.  You have chest pain.  You feel nauseous or vomit.  You have severe or increased shortness of breath with activity.  You feel weak.  You have a rapid heartbeat.  You have unexplained sweating.  You become light-headed when getting up from a chair or bed. MAKE SURE YOU:   Understand these instructions.  Will watch your condition.  Will get help right away if you are not doing well or get worse. Document Released: 08/11/2000 Document Revised: 08/19/2013 Document Reviewed: 04/21/2013 Exit[employer] Patient Information 2015 [employer]. This information is not intended to replace advice given to you by your health care provider. Make sure you discuss any questions you have with your health care provider.  

## 2015-03-02 NOTE — MAU Note (Signed)
Pt here for feraheme infusion.  

## 2015-03-05 ENCOUNTER — Inpatient Hospital Stay (HOSPITAL_COMMUNITY)
Admission: AD | Admit: 2015-03-05 | Discharge: 2015-03-05 | Disposition: A | Discharge status: Home / Self Care | Payer: BLUE CROSS/BLUE SHIELD | Source: Ambulatory Visit | Attending: Obstetrics and Gynecology | Admitting: Obstetrics and Gynecology

## 2015-03-05 DIAGNOSIS — D649 Anemia, unspecified: Secondary | ICD-10-CM | POA: Insufficient documentation

## 2015-03-05 MED ORDER — LACTATED RINGERS IV SOLN: INTRAVENOUS | Status: DC

## 2015-03-05 MED ORDER — SODIUM CHLORIDE 0.9 % IV SOLN
510.0000 mg | Freq: Once | INTRAVENOUS | Status: AC
  Administered 2015-03-05: 510 mg via INTRAVENOUS
  Filled 2015-03-05: qty 17

## 2015-03-05 NOTE — MAU Note (Signed)
Pt here for Feraheme infusion

## 2015-04-14 ENCOUNTER — Encounter (HOSPITAL_COMMUNITY): Payer: Self-pay | Admitting: *Deleted

## 2015-04-14 ENCOUNTER — Telehealth (HOSPITAL_COMMUNITY): Payer: Self-pay | Admitting: *Deleted

## 2015-04-14 LAB — OB RESULTS CONSOLE GBS: GBS: POSITIVE

## 2015-04-14 NOTE — Telephone Encounter (Signed)
Preadmission screen  

## 2015-04-21 ENCOUNTER — Encounter (HOSPITAL_COMMUNITY): Payer: Self-pay | Admitting: *Deleted

## 2015-04-21 ENCOUNTER — Inpatient Hospital Stay (HOSPITAL_COMMUNITY)
Admission: AD | Admit: 2015-04-21 | Discharge: 2015-04-23 | DRG: 775 | Disposition: A | Discharge status: Home / Self Care | Payer: BLUE CROSS/BLUE SHIELD | Source: Ambulatory Visit | Attending: Obstetrics and Gynecology | Admitting: Obstetrics and Gynecology

## 2015-04-21 ENCOUNTER — Inpatient Hospital Stay (HOSPITAL_COMMUNITY): Payer: BLUE CROSS/BLUE SHIELD | Admitting: Anesthesiology

## 2015-04-21 DIAGNOSIS — O99824 Streptococcus B carrier state complicating childbirth: Secondary | ICD-10-CM | POA: Diagnosis present

## 2015-04-21 DIAGNOSIS — IMO0001 Reserved for inherently not codable concepts without codable children: Secondary | ICD-10-CM

## 2015-04-21 DIAGNOSIS — O09523 Supervision of elderly multigravida, third trimester: Secondary | ICD-10-CM

## 2015-04-21 DIAGNOSIS — O99844 Bariatric surgery status complicating childbirth: Secondary | ICD-10-CM | POA: Diagnosis present

## 2015-04-21 DIAGNOSIS — Z6841 Body Mass Index (BMI) 40.0 and over, adult: Secondary | ICD-10-CM

## 2015-04-21 DIAGNOSIS — O99214 Obesity complicating childbirth: Secondary | ICD-10-CM | POA: Diagnosis present

## 2015-04-21 DIAGNOSIS — Z3A38 38 weeks gestation of pregnancy: Secondary | ICD-10-CM | POA: Diagnosis present

## 2015-04-21 LAB — ABO/RH: ABO/RH(D): A POS

## 2015-04-21 LAB — CBC
HCT: 36.8 % (ref 36.0–46.0)
Hemoglobin: 12.1 g/dL (ref 12.0–15.0)
MCH: 28.2 pg (ref 26.0–34.0)
MCHC: 32.9 g/dL (ref 30.0–36.0)
MCV: 85.8 fL (ref 78.0–100.0)
Platelets: 272 10*3/uL (ref 150–400)
RBC: 4.29 MIL/uL (ref 3.87–5.11)
RDW: 22 % — ABNORMAL HIGH (ref 11.5–15.5)
WBC: 8.1 10*3/uL (ref 4.0–10.5)

## 2015-04-21 LAB — TYPE AND SCREEN
ABO/RH(D): A POS
Antibody Screen: NEGATIVE

## 2015-04-21 MED ORDER — LIDOCAINE HCL (PF) 1 % IJ SOLN
30.0000 mL | INTRAMUSCULAR | Status: DC | PRN
  Filled 2015-04-21: qty 30

## 2015-04-21 MED ORDER — ZOLPIDEM TARTRATE 5 MG PO TABS: 5.0000 mg | ORAL_TABLET | Freq: Every evening | ORAL | Status: DC | PRN

## 2015-04-21 MED ORDER — DIBUCAINE 1 % RE OINT: 1.0000 "application " | TOPICAL_OINTMENT | RECTAL | Status: DC | PRN

## 2015-04-21 MED ORDER — BISACODYL 10 MG RE SUPP: 10.0000 mg | Freq: Every day | RECTAL | Status: DC | PRN

## 2015-04-21 MED ORDER — LACTATED RINGERS IV SOLN: 500.0000 mL | INTRAVENOUS | Status: DC | PRN

## 2015-04-21 MED ORDER — ONDANSETRON HCL 4 MG/2ML IJ SOLN: 4.0000 mg | Freq: Four times a day (QID) | INTRAMUSCULAR | Status: DC | PRN

## 2015-04-21 MED ORDER — MEASLES, MUMPS & RUBELLA VAC ~~LOC~~ INJ: 0.5000 mL | INJECTION | Freq: Once | SUBCUTANEOUS | Status: DC

## 2015-04-21 MED ORDER — ONDANSETRON HCL 4 MG/2ML IJ SOLN
4.0000 mg | INTRAMUSCULAR | Status: DC | PRN
Start: 2015-04-21 — End: 2015-04-23

## 2015-04-21 MED ORDER — OXYTOCIN 40 UNITS IN LACTATED RINGERS INFUSION - SIMPLE MED
1.0000 m[IU]/min | INTRAVENOUS | Status: DC
  Administered 2015-04-21: 2 m[IU]/min via INTRAVENOUS

## 2015-04-21 MED ORDER — PANTOPRAZOLE SODIUM 40 MG PO TBEC
40.0000 mg | DELAYED_RELEASE_TABLET | Freq: Every day | ORAL | Status: DC
Start: 2015-04-21 — End: 2015-04-23

## 2015-04-21 MED ORDER — LANOLIN HYDROUS EX OINT: TOPICAL_OINTMENT | CUTANEOUS | Status: DC | PRN

## 2015-04-21 MED ORDER — OXYTOCIN BOLUS FROM INFUSION
500.0000 mL | INTRAVENOUS | Status: DC
  Administered 2015-04-21: 500 mL via INTRAVENOUS

## 2015-04-21 MED ORDER — EPHEDRINE 5 MG/ML INJ
10.0000 mg | INTRAVENOUS | Status: DC | PRN
  Filled 2015-04-21: qty 2

## 2015-04-21 MED ORDER — SERTRALINE HCL 100 MG PO TABS
100.0000 mg | ORAL_TABLET | Freq: Every day | ORAL | Status: DC
  Administered 2015-04-22: 100 mg via ORAL
  Filled 2015-04-21 (×2): qty 1

## 2015-04-21 MED ORDER — IBUPROFEN 800 MG PO TABS
800.0000 mg | ORAL_TABLET | Freq: Three times a day (TID) | ORAL | Status: DC | PRN
  Filled 2015-04-21: qty 1

## 2015-04-21 MED ORDER — ACETAMINOPHEN 325 MG PO TABS: 650.0000 mg | ORAL_TABLET | ORAL | Status: DC | PRN

## 2015-04-21 MED ORDER — FLEET ENEMA 7-19 GM/118ML RE ENEM: 1.0000 | ENEMA | Freq: Every day | RECTAL | Status: DC | PRN

## 2015-04-21 MED ORDER — SIMETHICONE 80 MG PO CHEW: 80.0000 mg | CHEWABLE_TABLET | ORAL | Status: DC | PRN

## 2015-04-21 MED ORDER — LACTATED RINGERS IV SOLN
INTRAVENOUS | Status: DC
  Administered 2015-04-21: 10:00:00 via INTRAVENOUS

## 2015-04-21 MED ORDER — PENICILLIN G POTASSIUM 5000000 UNITS IJ SOLR
2.5000 10*6.[IU] | INTRAVENOUS | Status: DC
  Administered 2015-04-21 (×2): 2.5 10*6.[IU] via INTRAVENOUS
  Filled 2015-04-21 (×4): qty 2.5

## 2015-04-21 MED ORDER — DEXTROSE 5 % IV SOLN
5.0000 10*6.[IU] | Freq: Once | INTRAVENOUS | Status: AC
  Administered 2015-04-21: 5 10*6.[IU] via INTRAVENOUS
  Filled 2015-04-21: qty 5

## 2015-04-21 MED ORDER — ACETAMINOPHEN 325 MG PO TABS
650.0000 mg | ORAL_TABLET | ORAL | Status: DC | PRN
  Administered 2015-04-22 – 2015-04-23 (×3): 650 mg via ORAL
  Filled 2015-04-21 (×4): qty 2

## 2015-04-21 MED ORDER — PRENATAL MULTIVITAMIN CH
1.0000 | ORAL_TABLET | Freq: Every day | ORAL | Status: DC
  Administered 2015-04-22: 1 via ORAL
  Filled 2015-04-21: qty 1

## 2015-04-21 MED ORDER — OXYCODONE-ACETAMINOPHEN 5-325 MG PO TABS: 1.0000 | ORAL_TABLET | ORAL | Status: DC | PRN

## 2015-04-21 MED ORDER — BENZOCAINE-MENTHOL 20-0.5 % EX AERO: 1.0000 "application " | INHALATION_SPRAY | CUTANEOUS | Status: DC | PRN

## 2015-04-21 MED ORDER — TETANUS-DIPHTH-ACELL PERTUSSIS 5-2.5-18.5 LF-MCG/0.5 IM SUSP: 0.5000 mL | Freq: Once | INTRAMUSCULAR | Status: DC

## 2015-04-21 MED ORDER — OXYCODONE-ACETAMINOPHEN 5-325 MG PO TABS: 2.0000 | ORAL_TABLET | ORAL | Status: DC | PRN

## 2015-04-21 MED ORDER — PHENYLEPHRINE 40 MCG/ML (10ML) SYRINGE FOR IV PUSH (FOR BLOOD PRESSURE SUPPORT)
80.0000 ug | PREFILLED_SYRINGE | INTRAVENOUS | Status: DC | PRN
  Filled 2015-04-21: qty 20
  Filled 2015-04-21: qty 2

## 2015-04-21 MED ORDER — DIPHENHYDRAMINE HCL 50 MG/ML IJ SOLN: 12.5000 mg | INTRAMUSCULAR | Status: DC | PRN

## 2015-04-21 MED ORDER — FENTANYL 2.5 MCG/ML BUPIVACAINE 1/10 % EPIDURAL INFUSION (WH - ANES)
14.0000 mL/h | INTRAMUSCULAR | Status: DC | PRN
  Administered 2015-04-21: 14 mL/h via EPIDURAL
  Filled 2015-04-21: qty 125

## 2015-04-21 MED ORDER — ONDANSETRON HCL 4 MG PO TABS: 4.0000 mg | ORAL_TABLET | ORAL | Status: DC | PRN

## 2015-04-21 MED ORDER — FENTANYL 2.5 MCG/ML BUPIVACAINE 1/10 % EPIDURAL INFUSION (WH - ANES): 14.0000 mL/h | INTRAMUSCULAR | Status: DC | PRN

## 2015-04-21 MED ORDER — LIDOCAINE HCL (PF) 1 % IJ SOLN
INTRAMUSCULAR | Status: DC | PRN
  Administered 2015-04-21: 6 mL via EPIDURAL
  Administered 2015-04-21: 4 mL

## 2015-04-21 MED ORDER — SENNOSIDES-DOCUSATE SODIUM 8.6-50 MG PO TABS
2.0000 | ORAL_TABLET | ORAL | Status: DC
  Administered 2015-04-23: 2 via ORAL
  Filled 2015-04-21 (×2): qty 2

## 2015-04-21 MED ORDER — WITCH HAZEL-GLYCERIN EX PADS: 1.0000 "application " | MEDICATED_PAD | CUTANEOUS | Status: DC | PRN

## 2015-04-21 MED ORDER — TERBUTALINE SULFATE 1 MG/ML IJ SOLN
0.2500 mg | Freq: Once | INTRAMUSCULAR | Status: DC | PRN
  Filled 2015-04-21: qty 1

## 2015-04-21 MED ORDER — DIPHENHYDRAMINE HCL 25 MG PO CAPS: 25.0000 mg | ORAL_CAPSULE | Freq: Four times a day (QID) | ORAL | Status: DC | PRN

## 2015-04-21 MED ORDER — CITRIC ACID-SODIUM CITRATE 334-500 MG/5ML PO SOLN: 30.0000 mL | ORAL | Status: DC | PRN

## 2015-04-21 MED ORDER — LEVOTHYROXINE SODIUM 100 MCG PO TABS
300.0000 ug | ORAL_TABLET | Freq: Every day | ORAL | Status: DC
  Administered 2015-04-22: 300 ug via ORAL
  Filled 2015-04-21: qty 3
  Filled 2015-04-21: qty 2

## 2015-04-21 MED ORDER — OXYTOCIN 40 UNITS IN LACTATED RINGERS INFUSION - SIMPLE MED
62.5000 mL/h | INTRAVENOUS | Status: DC
  Administered 2015-04-21: 62.5 mL/h via INTRAVENOUS
  Filled 2015-04-21: qty 1000

## 2015-04-21 MED ORDER — FLEET ENEMA 7-19 GM/118ML RE ENEM: 1.0000 | ENEMA | RECTAL | Status: DC | PRN

## 2015-04-21 NOTE — H&P (Unsigned)
[name], [name]          ACCOUNT NO.:  [id number]  MEDICAL RECORD NO.:  [id number]  LOCATION:  QD82                          FACILITY:  [facility]  PHYSICIAN:  [name], M.D.DATE OF BIRTH:  [birth year]  DATE OF ADMISSION:  04/21/2015 DATE OF DISCHARGE:                             HISTORY & PHYSICAL   CHIEF COMPLAINT:  Rupture of membranes at term.  HISTORY OF PRESENT ILLNESS:  A 39 year old, G6, P3-0-2-3, EDD September 6, presents leaking fluid with some irregular contractions.  Last ultrasound in the office dated August 11, EFW 6 pounds 11 ounces, with resolution of fetal pyelectasis noted.  History significant for history of anxiety and depression, she has had a prior gastric bypass, has had fetal renal pyelectasis which has resolved at term, positive GBS, and was also being followed for elevated AFI which was normalized by the end of pregnancy.  One hour GTT was normal at 117.  She will be admitted for rupture of membranes at term.  PAST MEDICAL HISTORY:  Please see the [facility] form for details. Briefly, she is A positive.  She has had 3 vaginal deliveries.  PHYSICAL EXAM:  VITAL SIGNS:  Temp 98.2, blood pressure 130/78. HEENT:  Unremarkable. NECK:  Supple without masses. LUNGS:  Clear. CARDIOVASCULAR:  Regular rate and rhythm without murmurs, rubs, gallops. BREASTS:  Not examined. PELVIS:  Term fundal height.  Fetal heart rate 140, cervix was 2, 50% clear fluid, vertex. EXTREMITIES:  Unremarkable. NEUROLOGIC:  Unremarkable.  IMPRESSION:  Term pregnancy, rupture of membranes.  Positive GBS.  We will start penicillin protocol and admit anticipating vaginal delivery.     [name] M. Matthew Humphrey, M.D.     [facility]/MEDQ  D:  04/21/2015  T:  04/21/2015  Job:  [id number]

## 2015-04-21 NOTE — H&P (Signed)
[facility]ICKING  DICTATION # [id number] CSN# [id number]   Margarette Asal, MD 04/21/2015 9:38 AM

## 2015-04-21 NOTE — Anesthesia Procedure Notes (Signed)
Epidural Patient location during procedure: OB  Preanesthetic Checklist Completed: patient identified, site marked, surgical consent, pre-op evaluation, timeout performed, IV checked, risks and benefits discussed and monitors and equipment checked  Epidural Patient position: sitting Prep: site prepped and draped and DuraPrep Patient monitoring: continuous pulse ox and blood pressure Approach: midline Location: L3-L4 Injection technique: LOR air  Needle:  Needle type: Tuohy  Needle gauge: 17 G Needle length: 9 cm and 9 Needle insertion depth: 7 cm Catheter type: closed end flexible Catheter size: 19 Gauge Catheter at skin depth: 14 cm Test dose: negative  Assessment Events: blood not aspirated, injection not painful, no injection resistance, negative IV test and no paresthesia  Additional Notes Dosing of Epidural:  1st dose, through catheter ............................................Marland Kitchen  Xylocaine 40 mg  2nd dose, through catheter, after waiting 3 minutes........Marland KitchenXylocaine 60 mg    As each dose occurred, patient was free of IV sx; and patient exhibited no evidence of SA injection.  Patient is more comfortable after epidural dosed. Please see RN's note for documentation of vital signs,and FHR which are stable.  Patient reminded not to try to ambulate with numb legs, and that an RN must be present when she attempts to get up.

## 2015-04-21 NOTE — MAU Note (Signed)
States ROM approximately 0400, clear fluid,  denies cx's,

## 2015-04-21 NOTE — Anesthesia Preprocedure Evaluation (Signed)
Anesthesia Evaluation  Patient identified by MRN, date of birth, ID band Patient awake    Reviewed: Allergy & Precautions, H&P , Patient's Chart, lab work & pertinent test results  Airway Mallampati: II  TM Distance: >3 FB Neck ROM: full    Dental  (+) Teeth Intact   Pulmonary former smoker,  breath sounds clear to auscultation        Cardiovascular hypertension, Rhythm:regular Rate:Normal     Neuro/Psych    GI/Hepatic   Endo/Other  Morbid obesity  Renal/GU      Musculoskeletal   Abdominal   Peds  Hematology   Anesthesia Other Findings  Thyroid disease ... Hypothyroidism  GERD    Headache  Cancer 2010  thyroidectomy Hypertension  no meds currently  Anxiety  panic attacks   Dysrhythmia MO; s/p Gastric bypass      Reproductive/Obstetrics (+) Pregnancy                             Anesthesia Physical Anesthesia Plan  ASA: III  Anesthesia Plan: Epidural   Post-op Pain Management:    Induction:   Airway Management Planned:   Additional Equipment:   Intra-op Plan:   Post-operative Plan:   Informed Consent: I have reviewed the patients History and Physical, chart, labs and discussed the procedure including the risks, benefits and alternatives for the proposed anesthesia with the patient or authorized representative who has indicated his/her understanding and acceptance.   Dental Advisory Given  Plan Discussed with:   Anesthesia Plan Comments: (Labs checked- platelets confirmed with RN in room. Fetal heart tracing, per RN, reported to be stable enough for sitting procedure. Discussed epidural, and patient consents to the procedure:  included risk of possible headache,backache, failed block, allergic reaction, and nerve injury. This patient was asked if she had any questions or concerns before the procedure started.)        Anesthesia Quick Evaluation

## 2015-04-21 NOTE — MAU Note (Signed)
Positive fern slide.

## 2015-04-21 NOTE — MAU Note (Signed)
Notified Dr. Matthew Saras patient here for SROM around 4:30 this am, not contracting, 2/60/-3 vertex, GBS positive received admit orders.

## 2015-04-21 NOTE — MAU Note (Signed)
Patient up to bathroom to void.

## 2015-04-21 NOTE — Progress Notes (Addendum)
Delivery of live viable female by Dr Matthew Saras. APGARS 9,9

## 2015-04-22 LAB — CBC
HCT: 33.2 % — ABNORMAL LOW (ref 36.0–46.0)
Hemoglobin: 10.9 g/dL — ABNORMAL LOW (ref 12.0–15.0)
MCH: 28.2 pg (ref 26.0–34.0)
MCHC: 32.8 g/dL (ref 30.0–36.0)
MCV: 85.8 fL (ref 78.0–100.0)
Platelets: 214 10*3/uL (ref 150–400)
RBC: 3.87 MIL/uL (ref 3.87–5.11)
RDW: 21.8 % — ABNORMAL HIGH (ref 11.5–15.5)
WBC: 10.4 10*3/uL (ref 4.0–10.5)

## 2015-04-22 LAB — RPR: RPR Ser Ql: NONREACTIVE

## 2015-04-22 NOTE — Lactation Note (Signed)
This note was copied from the chart of Dana Andretta Ergle. Lactation Consultation Note  Patient Name: [name] [id number] Date: 04/22/2015 Reason for consult: Initial assessment   Of this mom and term baby, now 79 hours old. Mom is an experienced breast feeder. She chooses to breast and formula feed. She puts baby to breast first, and then offers formula. Mom denies any questions at this time, and lactation services reviewed.    Maternal Data    Feeding    LATCH Score/Interventions                      Lactation Tools Discussed/Used     Consult Status Consult Status: PRN Date: 04/23/15 Follow-up type: In-patient    Tonna Corner 04/22/2015, 4:06 PM

## 2015-04-22 NOTE — Progress Notes (Signed)
Post Partum Day 1 Subjective: no complaints, up ad lib, voiding, tolerating PO and + flatus  Objective: Blood pressure 102/58, pulse 57, temperature 98.4 F (36.9 C), temperature source Oral, resp. rate 18, height 5\' 4"  (1.626 m), weight 240 lb (108.863 kg), last menstrual period 07/28/2014, SpO2 98 %, unknown if currently breastfeeding.  Physical Exam:  General: alert and cooperative Lochia: appropriate Uterine Fundus: firm Incision: perineum intact DVT Evaluation: No evidence of DVT seen on physical exam. Negative Homan's sign. No cords or calf tenderness. No significant calf/ankle edema.   Recent Labs  04/21/15 1000 04/22/15 0555  HGB 12.1 10.9*  HCT 36.8 33.2*    Assessment/Plan: Plan for discharge tomorrow   LOS: 1 day   CURTIS,CAROL G 04/22/2015, 7:52 AM

## 2015-04-22 NOTE — Anesthesia Postprocedure Evaluation (Signed)
Anesthesia Post Note  Patient: [name]  Procedure(s) Performed: * No procedures listed *  Anesthesia type: Epidural  Patient location: Mother/Baby  Post pain: Pain level controlled  Post assessment: Post-op Vital signs reviewed  Last Vitals:  Filed Vitals:   04/22/15 0605  BP: 102/58  Pulse: 57  Temp: 36.9 C  Resp: 18    Post vital signs: Reviewed  Level of consciousness: awake  Complications: No apparent anesthesia complications

## 2015-04-23 NOTE — Discharge Summary (Signed)
Obstetric Discharge Summary Reason for Admission: rupture membranes Prenatal Procedures: ultrasound Intrapartum Procedures: spontaneous vaginal delivery Postpartum Procedures: none Complications-Operative and Postpartum: none HEMOGLOBIN  Date Value Ref Range Status  04/22/2015 10.9* 12.0 - 15.0 g/dL Final   HCT  Date Value Ref Range Status  04/22/2015 33.2* 36.0 - 46.0 % Final    Physical Exam:  General: alert and cooperative Lochia: appropriate Uterine Fundus: firm Incision: perineum intact DVT Evaluation: No evidence of DVT seen on physical exam. Negative Homan's sign. No cords or calf tenderness. No significant calf/ankle edema.  Discharge Diagnoses: Term Pregnancy-delivered  Discharge Information: Date: 04/23/2015 Activity: pelvic rest Diet: routine Medications: PNV and levothyroxine and zoloft Condition: stable Instructions: refer to practice specific booklet Discharge to: home   Newborn Data: Live born female  Birth Weight: 7 lb 3.2 oz (3265 g) APGAR: 9, 9  Home with mother.  [name] G 04/23/2015, 8:00 AM

## 2015-04-23 NOTE — Progress Notes (Signed)
MOB was referred for history of depression/anxiety.  Referral is screened out by Clinical Social Worker because none of the following criteria appear to apply: -History of anxiety/depression during this pregnancy, or of post-partum depression. - Diagnosis of anxiety and/or depression within last 3 years - History of depression due to pregnancy loss/loss of child or -MOB's symptoms are currently being treated with medication and/or therapy.  Please contact the Clinical Social Worker if needs arise or upon MOB request.

## 2015-04-27 ENCOUNTER — Inpatient Hospital Stay (HOSPITAL_COMMUNITY): Admission: RE | Admit: 2015-04-27 | Payer: BLUE CROSS/BLUE SHIELD | Source: Ambulatory Visit

## 2015-04-28 ENCOUNTER — Inpatient Hospital Stay (HOSPITAL_COMMUNITY): Admission: RE | Admit: 2015-04-28 | Payer: BLUE CROSS/BLUE SHIELD | Source: Ambulatory Visit

## 2016-06-01 DIAGNOSIS — Z1389 Encounter for screening for other disorder: Secondary | ICD-10-CM | POA: Diagnosis not present

## 2016-06-01 DIAGNOSIS — I10 Essential (primary) hypertension: Secondary | ICD-10-CM | POA: Diagnosis not present

## 2016-06-01 DIAGNOSIS — Z23 Encounter for immunization: Secondary | ICD-10-CM | POA: Diagnosis not present

## 2016-06-01 DIAGNOSIS — F329 Major depressive disorder, single episode, unspecified: Secondary | ICD-10-CM | POA: Diagnosis not present

## 2016-06-01 DIAGNOSIS — F419 Anxiety disorder, unspecified: Secondary | ICD-10-CM | POA: Diagnosis not present

## 2016-06-01 DIAGNOSIS — Z6841 Body Mass Index (BMI) 40.0 and over, adult: Secondary | ICD-10-CM | POA: Diagnosis not present

## 2016-06-15 DIAGNOSIS — E890 Postprocedural hypothyroidism: Secondary | ICD-10-CM | POA: Diagnosis not present

## 2016-06-15 DIAGNOSIS — R7301 Impaired fasting glucose: Secondary | ICD-10-CM | POA: Diagnosis not present

## 2016-07-04 DIAGNOSIS — E282 Polycystic ovarian syndrome: Secondary | ICD-10-CM | POA: Diagnosis not present

## 2016-07-04 DIAGNOSIS — Z6841 Body Mass Index (BMI) 40.0 and over, adult: Secondary | ICD-10-CM | POA: Diagnosis not present

## 2016-07-04 DIAGNOSIS — Z01419 Encounter for gynecological examination (general) (routine) without abnormal findings: Secondary | ICD-10-CM | POA: Diagnosis not present

## 2016-07-04 DIAGNOSIS — E669 Obesity, unspecified: Secondary | ICD-10-CM | POA: Diagnosis not present

## 2016-08-02 DIAGNOSIS — N911 Secondary amenorrhea: Secondary | ICD-10-CM | POA: Diagnosis not present

## 2016-08-09 DIAGNOSIS — Z3481 Encounter for supervision of other normal pregnancy, first trimester: Secondary | ICD-10-CM | POA: Diagnosis not present

## 2016-08-09 DIAGNOSIS — Z3480 Encounter for supervision of other normal pregnancy, unspecified trimester: Secondary | ICD-10-CM | POA: Diagnosis not present

## 2016-08-09 DIAGNOSIS — Z3685 Encounter for antenatal screening for Streptococcus B: Secondary | ICD-10-CM | POA: Diagnosis not present

## 2016-08-09 LAB — OB RESULTS CONSOLE HIV ANTIBODY (ROUTINE TESTING): HIV: NONREACTIVE

## 2016-08-09 LAB — OB RESULTS CONSOLE GC/CHLAMYDIA
Chlamydia: NEGATIVE
Gonorrhea: NEGATIVE

## 2016-08-09 LAB — OB RESULTS CONSOLE ABO/RH: RH Type: POSITIVE

## 2016-08-09 LAB — OB RESULTS CONSOLE RPR: RPR: NONREACTIVE

## 2016-08-09 LAB — OB RESULTS CONSOLE HEPATITIS B SURFACE ANTIGEN: Hepatitis B Surface Ag: NEGATIVE

## 2016-08-09 LAB — OB RESULTS CONSOLE GBS: GBS: POSITIVE

## 2016-08-09 LAB — OB RESULTS CONSOLE ANTIBODY SCREEN: Antibody Screen: NEGATIVE

## 2016-08-09 LAB — OB RESULTS CONSOLE RUBELLA ANTIBODY, IGM: Rubella: IMMUNE

## 2016-08-28 NOTE — L&D Delivery Note (Signed)
Delivery Note At 9:07 AM a viable female was delivered via Vaginal, Spontaneous Delivery   APGAR: 9, 9; weight  pending .   Placenta status:spontaneously with 3 vessel cord , .  Cord:  with the following complications: none.  Cord pH: not obtained  Anesthesia:  epidural Episiotomy: None Lacerations: 1st degree Suture Repair: 3.0 chromic Est. Blood Loss (mL): 300  Mom to postpartum.  Baby to Couplet care / Skin to Skin.  Lyra Alaimo L 03/06/2017, 9:28 AM

## 2016-08-30 DIAGNOSIS — Z3A12 12 weeks gestation of pregnancy: Secondary | ICD-10-CM | POA: Diagnosis not present

## 2016-08-30 DIAGNOSIS — Z113 Encounter for screening for infections with a predominantly sexual mode of transmission: Secondary | ICD-10-CM | POA: Diagnosis not present

## 2016-08-30 DIAGNOSIS — Z3401 Encounter for supervision of normal first pregnancy, first trimester: Secondary | ICD-10-CM | POA: Diagnosis not present

## 2016-08-31 DIAGNOSIS — Z3682 Encounter for antenatal screening for nuchal translucency: Secondary | ICD-10-CM | POA: Diagnosis not present

## 2016-08-31 DIAGNOSIS — Z3A12 12 weeks gestation of pregnancy: Secondary | ICD-10-CM | POA: Diagnosis not present

## 2016-08-31 DIAGNOSIS — O09521 Supervision of elderly multigravida, first trimester: Secondary | ICD-10-CM | POA: Diagnosis not present

## 2016-10-06 DIAGNOSIS — Z3480 Encounter for supervision of other normal pregnancy, unspecified trimester: Secondary | ICD-10-CM | POA: Diagnosis not present

## 2016-10-06 DIAGNOSIS — Z362 Encounter for other antenatal screening follow-up: Secondary | ICD-10-CM | POA: Diagnosis not present

## 2016-10-06 DIAGNOSIS — Z3A17 17 weeks gestation of pregnancy: Secondary | ICD-10-CM | POA: Diagnosis not present

## 2016-11-01 DIAGNOSIS — Z3A21 21 weeks gestation of pregnancy: Secondary | ICD-10-CM | POA: Diagnosis not present

## 2016-11-01 DIAGNOSIS — Z362 Encounter for other antenatal screening follow-up: Secondary | ICD-10-CM | POA: Diagnosis not present

## 2016-12-07 DIAGNOSIS — Z3A26 26 weeks gestation of pregnancy: Secondary | ICD-10-CM | POA: Diagnosis not present

## 2016-12-07 DIAGNOSIS — Z3480 Encounter for supervision of other normal pregnancy, unspecified trimester: Secondary | ICD-10-CM | POA: Diagnosis not present

## 2016-12-18 DIAGNOSIS — O99810 Abnormal glucose complicating pregnancy: Secondary | ICD-10-CM | POA: Diagnosis not present

## 2017-01-04 DIAGNOSIS — Z3688 Encounter for antenatal screening for fetal macrosomia: Secondary | ICD-10-CM | POA: Diagnosis not present

## 2017-01-05 DIAGNOSIS — E890 Postprocedural hypothyroidism: Secondary | ICD-10-CM | POA: Diagnosis not present

## 2017-01-09 DIAGNOSIS — C73 Malignant neoplasm of thyroid gland: Secondary | ICD-10-CM | POA: Diagnosis not present

## 2017-01-09 DIAGNOSIS — E890 Postprocedural hypothyroidism: Secondary | ICD-10-CM | POA: Diagnosis not present

## 2017-01-09 DIAGNOSIS — E559 Vitamin D deficiency, unspecified: Secondary | ICD-10-CM | POA: Diagnosis not present

## 2017-01-10 DIAGNOSIS — Z3480 Encounter for supervision of other normal pregnancy, unspecified trimester: Secondary | ICD-10-CM | POA: Diagnosis not present

## 2017-01-10 DIAGNOSIS — D649 Anemia, unspecified: Secondary | ICD-10-CM | POA: Diagnosis not present

## 2017-01-16 ENCOUNTER — Inpatient Hospital Stay (HOSPITAL_COMMUNITY)
Admission: AD | Admit: 2017-01-16 | Discharge: 2017-01-16 | Disposition: A | Discharge status: Home / Self Care | Payer: BLUE CROSS/BLUE SHIELD | Source: Ambulatory Visit | Attending: Obstetrics and Gynecology | Admitting: Obstetrics and Gynecology

## 2017-01-16 ENCOUNTER — Encounter (HOSPITAL_COMMUNITY): Payer: Self-pay | Admitting: *Deleted

## 2017-01-16 DIAGNOSIS — Z79899 Other long term (current) drug therapy: Secondary | ICD-10-CM | POA: Diagnosis not present

## 2017-01-16 DIAGNOSIS — Z9889 Other specified postprocedural states: Secondary | ICD-10-CM | POA: Diagnosis not present

## 2017-01-16 DIAGNOSIS — Z3A32 32 weeks gestation of pregnancy: Secondary | ICD-10-CM | POA: Insufficient documentation

## 2017-01-16 DIAGNOSIS — O9989 Other specified diseases and conditions complicating pregnancy, childbirth and the puerperium: Secondary | ICD-10-CM | POA: Diagnosis not present

## 2017-01-16 DIAGNOSIS — D649 Anemia, unspecified: Secondary | ICD-10-CM | POA: Insufficient documentation

## 2017-01-16 DIAGNOSIS — Z87891 Personal history of nicotine dependence: Secondary | ICD-10-CM | POA: Insufficient documentation

## 2017-01-16 DIAGNOSIS — Z9049 Acquired absence of other specified parts of digestive tract: Secondary | ICD-10-CM | POA: Diagnosis not present

## 2017-01-16 DIAGNOSIS — R030 Elevated blood-pressure reading, without diagnosis of hypertension: Secondary | ICD-10-CM | POA: Insufficient documentation

## 2017-01-16 DIAGNOSIS — O26893 Other specified pregnancy related conditions, third trimester: Secondary | ICD-10-CM | POA: Diagnosis not present

## 2017-01-16 DIAGNOSIS — O99013 Anemia complicating pregnancy, third trimester: Secondary | ICD-10-CM | POA: Diagnosis not present

## 2017-01-16 LAB — CBC
HCT: 29 % — ABNORMAL LOW (ref 36.0–46.0)
Hemoglobin: 9.1 g/dL — ABNORMAL LOW (ref 12.0–15.0)
MCH: 24.2 pg — ABNORMAL LOW (ref 26.0–34.0)
MCHC: 31.4 g/dL (ref 30.0–36.0)
MCV: 77.1 fL — ABNORMAL LOW (ref 78.0–100.0)
Platelets: 343 10*3/uL (ref 150–400)
RBC: 3.76 MIL/uL — ABNORMAL LOW (ref 3.87–5.11)
RDW: 16.7 % — ABNORMAL HIGH (ref 11.5–15.5)
WBC: 9.7 10*3/uL (ref 4.0–10.5)

## 2017-01-16 LAB — COMPREHENSIVE METABOLIC PANEL
ALT: 13 U/L — ABNORMAL LOW (ref 14–54)
AST: 21 U/L (ref 15–41)
Albumin: 2.5 g/dL — ABNORMAL LOW (ref 3.5–5.0)
Alkaline Phosphatase: 107 U/L (ref 38–126)
Anion gap: 9 (ref 5–15)
BUN: 9 mg/dL (ref 6–20)
CO2: 19 mmol/L — ABNORMAL LOW (ref 22–32)
Calcium: 8.5 mg/dL — ABNORMAL LOW (ref 8.9–10.3)
Chloride: 106 mmol/L (ref 101–111)
Creatinine, Ser: 0.42 mg/dL — ABNORMAL LOW (ref 0.44–1.00)
GFR calc Af Amer: >60 mL/min (ref >60)
GFR calc non Af Amer: >60 mL/min (ref >60)
Glucose, Bld: 125 mg/dL — ABNORMAL HIGH (ref 65–99)
Potassium: 3.5 mmol/L (ref 3.5–5.1)
Sodium: 134 mmol/L — ABNORMAL LOW (ref 135–145)
Total Bilirubin: 0.2 mg/dL — ABNORMAL LOW (ref 0.3–1.2)
Total Protein: 6.3 g/dL — ABNORMAL LOW (ref 6.5–8.1)

## 2017-01-16 LAB — PROTEIN / CREATININE RATIO, URINE
Creatinine, Urine: 174 mg/dL
Protein Creatinine Ratio: 0.13 mg/mg{Cre} (ref 0.00–0.15)
Total Protein, Urine: 22 mg/dL

## 2017-01-16 MED ORDER — SODIUM CHLORIDE 0.9 % IV SOLN
510.0000 mg | INTRAVENOUS | Status: DC
  Administered 2017-01-16: 510 mg via INTRAVENOUS
  Filled 2017-01-16: qty 17

## 2017-01-16 MED ORDER — LACTATED RINGERS IV BOLUS (SEPSIS)
1000.0000 mL | Freq: Once | INTRAVENOUS | Status: AC
Start: 2017-01-16 — End: 2017-01-16
  Administered 2017-01-16: 1000 mL via INTRAVENOUS

## 2017-01-16 NOTE — MAU Provider Note (Signed)
History     CSN: [id number]  Arrival date and time: 01/16/17 1047  First Provider Initiated Contact with Patient 01/16/17 1221      Chief Complaint  Patient presents with  . Anemia   HPI [name] is a 41 y.o. [id number] at [redacted]w[redacted]d who presents for feraheme infusion to treat her anemia. Initial BP was elevated so I was asked to evaluate her.  Patient denies any history of hypertension. Reports intermittent headaches for the last week that she thinks are due to her sinus infection. Currently no headache. Denies visual disturbance, epigastric pain, CP, SOB, or abdominal pain. Positive fetal movement.   OB History    Gravida Para Term Preterm AB Living   7 4 4   2 4    SAB TAB Ectopic Multiple Live Births   1 1   0 4      Past Medical History:  Diagnosis Date  . AMA (advanced maternal age) multigravida 109+   . Anxiety    panic attacks  . Cancer [facility]) 2010   thyroidectomy  . Dysrhythmia    no meds currently- palpitations assoc with panic disorder or thyroid disease  . GERD (gastroesophageal reflux disease)   . Headache(784.0)   . Hypothyroidism   . PONV (postoperative nausea and vomiting)   . Shortness of breath    on exertion- due to weight  . Thyroid disease     Past Surgical History:  Procedure Laterality Date  . bilateral arm lift surgery  2005  . CHOLECYSTECTOMY  11/01/2011   Procedure: LAPAROSCOPIC CHOLECYSTECTOMY;  Surgeon: Donato Heinz, MD;  Location: [facility];  Service: General;  Laterality: N/A;  . colonscopy    . DIAGNOSTIC LAPAROSCOPY  1994   x 2  . DILATION AND CURETTAGE OF UTERUS  10/12/2011   Procedure: DILATATION AND CURETTAGE;  Surgeon: Cyril Mourning, MD;  Location: [facility];  Service: Gynecology;  Laterality: N/A;  . DILATION AND CURETTAGE OF UTERUS N/A 08/19/2013   Procedure: DILATATION AND CURETTAGE;  Surgeon: Cyril Mourning, MD;  Location: [facility];  Service: Gynecology;  Laterality: N/A;  . 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 GASTRIC BYPASS    . OVARIAN CYST REMOVAL  '95  . removal of IUD    . right tube removed & ovarian cyst '11    . SVD     x 3  . THYROIDECTOMY    . TONSILLECTOMY AND ADENOIDECTOMY      Family History  Problem Relation Age of Onset  . Heart attack Father        Age 86  . Anesthesia problems Neg Hx   . Hypotension Neg Hx   . Malignant hyperthermia Neg Hx   . Pseudochol deficiency Neg Hx     Social History  Substance Use Topics  . Smoking status: Former Smoker    Packs/day: 1.00    Years: 5.00    Types: Cigarettes    Quit date: 01/26/2005  . Smokeless tobacco: Never Used  . Alcohol use Yes     Comment: occasionally, but not while pregnant    Allergies: No Known Allergies  Prescriptions Prior to Admission  Medication Sig Dispense Refill Last Dose  . acetaminophen (TYLENOL) 500 MG tablet Take 500 mg by mouth every 6 (six) hours as needed for headache.   Past Week at Unknown time  . cholecalciferol (VITAMIN D) 1000 UNITS tablet Take 1,000  Units by mouth at bedtime.    01/15/2017 at Unknown time  . Fe Cbn-Fe Gluc-FA-B12-C-DSS (FERRALET 90) 90-1 MG TABS Take 1 tablet by mouth daily.  3 01/15/2017 at Unknown time  . LEVOTHYROXINE SODIUM PO Take 225 mg by mouth 2 (two) times daily.   01/16/2017 at Unknown time  . omeprazole (PRILOSEC) 40 MG capsule Take 40 mg by mouth daily as needed.  3 01/15/2017 at Unknown time  . Prenatal Vit-Fe Fumarate-FA (PRENATAL MULTIVITAMIN) TABS tablet Take 1 tablet by mouth daily at 12 noon.   01/15/2017 at Unknown time  . sertraline (ZOLOFT) 100 MG tablet Take 100 mg by mouth daily.    01/15/2017 at Unknown time    Review of Systems  Constitutional: Negative.   Eyes: Negative for visual disturbance.  Respiratory: Negative for shortness of breath.   Cardiovascular: Negative for palpitations.  Gastrointestinal: Negative.   Genitourinary: Negative.   Neurological: Positive for headaches (none today).   Physical Exam   Blood  pressure 110/70, pulse 78, temperature 98.3 F (36.8 C), temperature source Oral, resp. rate 18, height 5\' 6"  (1.676 m), weight 279 lb (126.6 kg), SpO2 99 %, unknown if currently breastfeeding.  Temp:  [98.3 F (36.8 C)] 98.3 F (36.8 C) (05/22 1109) Pulse Rate:  [75-114] 78 (05/22 1346) Resp:  [18] 18 (05/22 1109) BP: (92-151)/(45-95) 110/70 (05/22 1346) SpO2:  [99 %-100 %] 99 % (05/22 1316) Weight:  [279 lb (126.6 kg)] 279 lb (126.6 kg) (05/22 1109)   Physical Exam  Nursing note and vitals reviewed. Constitutional: She is oriented to person, place, and time. She appears well-developed and well-nourished. No distress.  HENT:  Head: Normocephalic and atraumatic.  Eyes: Conjunctivae are normal. Right eye exhibits no discharge. Left eye exhibits no discharge. No scleral icterus.  Neck: Normal range of motion.  Cardiovascular: Normal rate, regular rhythm and normal heart sounds.   No murmur heard. Respiratory: Effort normal and breath sounds normal. No respiratory distress. She has no wheezes.  GI: Soft. Bowel sounds are normal. There is no tenderness.  Neurological: She is alert and oriented to person, place, and time. She has normal reflexes.  No clonus  Skin: Skin is warm and dry. She is not diaphoretic.  Psychiatric: She has a normal mood and affect. Her behavior is normal. Judgment and thought content normal.   Fetal Tracing:  Baseline: 130 Variability: moderate Accelerations: 15x15 Decelerations: none  Toco: none MAU Course  Procedures Results for orders placed or performed during the hospital encounter of 01/16/17 (from the past 24 hour(s))  CBC     Status: Abnormal   Collection Time: 01/16/17 11:50 AM  Result Value Ref Range   WBC 9.7 4.0 - 10.5 K/uL   RBC 3.76 (L) 3.87 - 5.11 MIL/uL   Hemoglobin 9.1 (L) 12.0 - 15.0 g/dL   HCT 29.0 (L) 36.0 - 46.0 %   MCV 77.1 (L) 78.0 - 100.0 fL   MCH 24.2 (L) 26.0 - 34.0 pg   MCHC 31.4 30.0 - 36.0 g/dL   RDW 16.7 (H) 11.5 -  15.5 %   Platelets 343 150 - 400 K/uL  Comprehensive metabolic panel     Status: Abnormal   Collection Time: 01/16/17 11:50 AM  Result Value Ref Range   Sodium 134 (L) 135 - 145 mmol/L   Potassium 3.5 3.5 - 5.1 mmol/L   Chloride 106 101 - 111 mmol/L   CO2 19 (L) 22 - 32 mmol/L   Glucose, Bld 125 (H) 65 -  99 mg/dL   BUN 9 6 - 20 mg/dL   Creatinine, Ser 0.42 (L) 0.44 - 1.00 mg/dL   Calcium 8.5 (L) 8.9 - 10.3 mg/dL   Total Protein 6.3 (L) 6.5 - 8.1 g/dL   Albumin 2.5 (L) 3.5 - 5.0 g/dL   AST 21 15 - 41 U/L   ALT 13 (L) 14 - 54 U/L   Alkaline Phosphatase 107 38 - 126 U/L   Total Bilirubin 0.2 (L) 0.3 - 1.2 mg/dL   GFR calc non Af Amer >60 >60 mL/min   GFR calc Af Amer >60 >60 mL/min   Anion gap 9 5 - 15  Protein / creatinine ratio, urine     Status: None   Collection Time: 01/16/17 12:31 PM  Result Value Ref Range   Creatinine, Urine 174.00 mg/dL   Total Protein, Urine 22 mg/dL   Protein Creatinine Ratio 0.13 0.00 - 0.15 mg/mg[Cre]    MDM No severe range BPs & patient normotensive prior to discharge CBC, CMP, urine PCR --- [facility] labs wnl S/w Dr. Julien Girt. Will go ahead & administer feraheme. Pt to f/u in office tomorrow for BP check.  Feraheme infusion completed without reaction Assessment and Plan  A; 1. Elevated BP without diagnosis of hypertension   2. Anemia during pregnancy in third trimester    P: Discharge home Discussed reasons to return to MAU Patient has appt with OB scheduled tomorrow  Jorje Guild 01/16/2017, 12:20 PM

## 2017-01-16 NOTE — Discharge Instructions (Signed)
Hypertension During Pregnancy °Hypertension, commonly called high blood pressure, is when the force of blood pumping through your arteries is too strong. Arteries are blood vessels that carry blood from the heart throughout the body. Hypertension during pregnancy can cause problems for you and your baby. Your baby may be born early (prematurely) or may not weigh as much as he or she should at birth. Very bad cases of hypertension during pregnancy can be life-threatening. °Different types of hypertension can occur during pregnancy. These include: °· Chronic hypertension. This happens when: °¨ You have hypertension before pregnancy and it continues during pregnancy. °¨ You develop hypertension before you are [redacted] weeks pregnant, and it continues during pregnancy. °· Gestational hypertension. This is hypertension that develops after the 20th week of pregnancy. °· Preeclampsia, also called toxemia of pregnancy. This is a very serious type of hypertension that develops only during pregnancy. It affects the whole body, and it can be very dangerous for you and your baby. °Gestational hypertension and preeclampsia usually go away within 6 weeks after your baby is born. Women who have hypertension during pregnancy have a greater chance of developing hypertension later in life or during future pregnancies. °What are the causes? °The exact cause of hypertension is not known. °What increases the risk? °There are certain factors that make it more likely for you to develop hypertension during pregnancy. These include: °· Having hypertension during a previous pregnancy or prior to pregnancy. °· Being overweight. °· Being older than age 40. °· Being pregnant for the first time or being pregnant with more than one baby. °· Becoming pregnant using fertilization methods such as IVF (in vitro fertilization). °· Having diabetes, kidney problems, or systemic lupus erythematosus. °· Having a family history of hypertension. °What are the  signs or symptoms? °Chronic hypertension and gestational hypertension rarely cause symptoms. Preeclampsia causes symptoms, which may include: °· Increased protein in your urine. Your health care provider will check for this at every visit before you give birth (prenatal visit). °· Severe headaches. °· Sudden weight gain. °· Swelling of the hands, face, legs, and feet. °· Nausea and vomiting. °· Vision problems, such as blurred or double vision. °· Numbness in the face, arms, legs, and feet. °· Dizziness. °· Slurred speech. °· Sensitivity to bright lights. °· Abdominal pain. °· Convulsions. °How is this diagnosed? °You may be diagnosed with hypertension during a routine prenatal exam. At each prenatal visit, you may: °· Have a urine test to check for high amounts of protein in your urine. °· Have your blood pressure checked. A blood pressure reading is recorded as two numbers, such as "120 over 80" (or 120/80). The first ("top") number is called the systolic pressure. It is a measure of the pressure in your arteries when your heart beats. The second ("bottom") number is called the diastolic pressure. It is a measure of the pressure in your arteries as your heart relaxes between beats. Blood pressure is measured in a unit called mm Hg. A normal blood pressure reading is: °¨ Systolic: below 120. °¨ Diastolic: below 80. °The type of hypertension that you are diagnosed with depends on your test results and when your symptoms developed. °· Chronic hypertension is usually diagnosed before 20 weeks of pregnancy. °· Gestational hypertension is usually diagnosed after 20 weeks of pregnancy. °· Hypertension with high amounts of protein in the urine is diagnosed as preeclampsia. °· Blood pressure measurements that stay above 160 systolic, or above 110 diastolic, are signs of severe preeclampsia. °  How is this treated? °Treatment for hypertension during pregnancy varies depending on the type of hypertension you have and how  serious it is. °· If you take medicines called ACE inhibitors to treat chronic hypertension, you may need to switch medicines. ACE inhibitors should not be taken during pregnancy. °· If you have gestational hypertension, you may need to take blood pressure medicine. °· If you are at risk for preeclampsia, your health care provider may recommend that you take a low-dose aspirin every day to prevent high blood pressure during your pregnancy. °· If you have severe preeclampsia, you may need to be hospitalized so you and your baby can be monitored closely. You may also need to take medicine (magnesium sulfate) to prevent seizures and to lower blood pressure. This medicine may be given as an injection or through an IV tube. °· In some cases, if your condition gets worse, you may need to deliver your baby early. °Follow these instructions at home: °Eating and drinking °· Drink enough fluid to keep your urine clear or pale yellow. °· Eat a healthy diet that is low in salt (sodium). Do not add salt to your food. Check food labels to see how much sodium a food or beverage contains. °Lifestyle °· Do not use any products that contain nicotine or tobacco, such as cigarettes and e-cigarettes. If you need help quitting, ask your health care provider. °· Do not use alcohol. °· Avoid caffeine. °· Avoid stress as much as possible. Rest and get plenty of sleep. °General instructions °· Take over-the-counter and prescription medicines only as told by your health care provider. °· While lying down, lie on your left side. This keeps pressure off your baby. °· While sitting or lying down, raise (elevate) your feet. Try putting some pillows under your lower legs. °· Exercise regularly. Ask your health care provider what kinds of exercise are best for you. °· Keep all prenatal and follow-up visits as told by your health care provider. This is important. °Contact a health care provider if: °· You have symptoms that your health care provider  told you may require more treatment or monitoring, such as: °¨ Fever. °¨ Vomiting. °¨ Headache. °Get help right away if: °· You have severe abdominal pain or vomiting that does not get better with treatment. °· You suddenly develop swelling in your hands, ankles, or face. °· You gain 4 lbs (1.8 kg) or more in 1 week. °· You develop vaginal bleeding, or you have blood in your urine. °· You do not feel your baby moving as much as usual. °· You have blurred or double vision. °· You have muscle twitching or sudden tightening (spasms). °· You have shortness of breath. °· Your lips or fingernails turn blue. °This information is not intended to replace advice given to you by your health care provider. Make sure you discuss any questions you have with your health care provider. °Document Released: 05/02/2011 Document Revised: 03/03/2016 Document Reviewed: 01/28/2016 °Elsevier Interactive Patient Education © 2017 [employer] ° °

## 2017-01-16 NOTE — MAU Note (Signed)
Pt presents for outpatient medication

## 2017-01-19 ENCOUNTER — Inpatient Hospital Stay (HOSPITAL_COMMUNITY)
Admission: AD | Admit: 2017-01-19 | Discharge: 2017-01-19 | Disposition: A | Discharge status: Home / Self Care | Payer: BLUE CROSS/BLUE SHIELD | Source: Ambulatory Visit | Attending: Obstetrics and Gynecology | Admitting: Obstetrics and Gynecology

## 2017-01-19 DIAGNOSIS — D649 Anemia, unspecified: Secondary | ICD-10-CM | POA: Diagnosis not present

## 2017-01-19 DIAGNOSIS — Z3A00 Weeks of gestation of pregnancy not specified: Secondary | ICD-10-CM | POA: Insufficient documentation

## 2017-01-19 DIAGNOSIS — O99019 Anemia complicating pregnancy, unspecified trimester: Secondary | ICD-10-CM | POA: Insufficient documentation

## 2017-01-19 MED ORDER — SODIUM CHLORIDE 0.9 % IV SOLN
510.0000 mg | Freq: Once | INTRAVENOUS | Status: AC
  Administered 2017-01-19: 510 mg via INTRAVENOUS
  Filled 2017-01-19: qty 17

## 2017-01-19 NOTE — MAU Note (Signed)
Patient presents to mau for iron infusion.  Denies any symptoms at this time. +FM

## 2017-02-01 DIAGNOSIS — Z3480 Encounter for supervision of other normal pregnancy, unspecified trimester: Secondary | ICD-10-CM | POA: Diagnosis not present

## 2017-02-01 DIAGNOSIS — Z3A34 34 weeks gestation of pregnancy: Secondary | ICD-10-CM | POA: Diagnosis not present

## 2017-02-01 DIAGNOSIS — O368190 Decreased fetal movements, unspecified trimester, not applicable or unspecified: Secondary | ICD-10-CM | POA: Diagnosis not present

## 2017-02-01 DIAGNOSIS — D509 Iron deficiency anemia, unspecified: Secondary | ICD-10-CM | POA: Diagnosis not present

## 2017-02-07 DIAGNOSIS — Z3480 Encounter for supervision of other normal pregnancy, unspecified trimester: Secondary | ICD-10-CM | POA: Diagnosis not present

## 2017-02-07 DIAGNOSIS — Z36 Encounter for antenatal screening for chromosomal anomalies: Secondary | ICD-10-CM | POA: Diagnosis not present

## 2017-02-07 LAB — OB RESULTS CONSOLE GBS: GBS: NEGATIVE

## 2017-02-15 DIAGNOSIS — O3663X0 Maternal care for excessive fetal growth, third trimester, not applicable or unspecified: Secondary | ICD-10-CM | POA: Diagnosis not present

## 2017-02-15 DIAGNOSIS — Z3A36 36 weeks gestation of pregnancy: Secondary | ICD-10-CM | POA: Diagnosis not present

## 2017-02-22 ENCOUNTER — Encounter (HOSPITAL_COMMUNITY): Payer: Self-pay | Admitting: *Deleted

## 2017-02-22 ENCOUNTER — Telehealth (HOSPITAL_COMMUNITY): Payer: Self-pay | Admitting: *Deleted

## 2017-02-22 NOTE — Telephone Encounter (Signed)
Preadmission screen  

## 2017-03-06 ENCOUNTER — Inpatient Hospital Stay (HOSPITAL_COMMUNITY): Payer: BLUE CROSS/BLUE SHIELD | Admitting: Anesthesiology

## 2017-03-06 ENCOUNTER — Encounter (HOSPITAL_COMMUNITY)
Admission: RE | Disposition: A | Discharge status: Home / Self Care | Payer: Self-pay | Source: Ambulatory Visit | Attending: Obstetrics and Gynecology

## 2017-03-06 ENCOUNTER — Inpatient Hospital Stay (HOSPITAL_COMMUNITY)
Admission: RE | Admit: 2017-03-06 | Discharge: 2017-03-08 | DRG: 982 | Disposition: A | Discharge status: Home / Self Care | Payer: BLUE CROSS/BLUE SHIELD | Source: Ambulatory Visit | Attending: Obstetrics and Gynecology | Admitting: Obstetrics and Gynecology

## 2017-03-06 ENCOUNTER — Encounter (HOSPITAL_COMMUNITY): Payer: Self-pay

## 2017-03-06 VITALS — BP 116/59 | HR 71 | Temp 98.7°F | Resp 18 | Ht 65.0 in | Wt 280.0 lb

## 2017-03-06 DIAGNOSIS — Z412 Encounter for routine and ritual male circumcision: Secondary | ICD-10-CM | POA: Diagnosis not present

## 2017-03-06 DIAGNOSIS — Z3493 Encounter for supervision of normal pregnancy, unspecified, third trimester: Secondary | ICD-10-CM | POA: Diagnosis not present

## 2017-03-06 DIAGNOSIS — O99284 Endocrine, nutritional and metabolic diseases complicating childbirth: Principal | ICD-10-CM | POA: Diagnosis present

## 2017-03-06 DIAGNOSIS — E039 Hypothyroidism, unspecified: Secondary | ICD-10-CM | POA: Diagnosis present

## 2017-03-06 DIAGNOSIS — Z5309 Procedure and treatment not carried out because of other contraindication: Secondary | ICD-10-CM | POA: Diagnosis not present

## 2017-03-06 DIAGNOSIS — Z302 Encounter for sterilization: Secondary | ICD-10-CM | POA: Diagnosis not present

## 2017-03-06 DIAGNOSIS — Z3A39 39 weeks gestation of pregnancy: Secondary | ICD-10-CM | POA: Diagnosis not present

## 2017-03-06 DIAGNOSIS — Z6841 Body Mass Index (BMI) 40.0 and over, adult: Secondary | ICD-10-CM

## 2017-03-06 DIAGNOSIS — O9989 Other specified diseases and conditions complicating pregnancy, childbirth and the puerperium: Secondary | ICD-10-CM | POA: Diagnosis not present

## 2017-03-06 DIAGNOSIS — Z3490 Encounter for supervision of normal pregnancy, unspecified, unspecified trimester: Secondary | ICD-10-CM

## 2017-03-06 DIAGNOSIS — O99214 Obesity complicating childbirth: Secondary | ICD-10-CM | POA: Diagnosis not present

## 2017-03-06 DIAGNOSIS — Z87891 Personal history of nicotine dependence: Secondary | ICD-10-CM | POA: Diagnosis not present

## 2017-03-06 DIAGNOSIS — Z23 Encounter for immunization: Secondary | ICD-10-CM | POA: Diagnosis not present

## 2017-03-06 DIAGNOSIS — N736 Female pelvic peritoneal adhesions (postinfective): Secondary | ICD-10-CM | POA: Diagnosis not present

## 2017-03-06 HISTORY — PX: TUBAL LIGATION: SHX77

## 2017-03-06 LAB — CBC
HCT: 38.3 % (ref 36.0–46.0)
Hemoglobin: 12.7 g/dL (ref 12.0–15.0)
MCH: 27.6 pg (ref 26.0–34.0)
MCHC: 33.2 g/dL (ref 30.0–36.0)
MCV: 83.3 fL (ref 78.0–100.0)
Platelets: 312 10*3/uL (ref 150–400)
RBC: 4.6 MIL/uL (ref 3.87–5.11)
RDW: 22.3 % — ABNORMAL HIGH (ref 11.5–15.5)
WBC: 9.8 10*3/uL (ref 4.0–10.5)

## 2017-03-06 LAB — RPR: RPR Ser Ql: NONREACTIVE

## 2017-03-06 LAB — TYPE AND SCREEN
ABO/RH(D): A POS
Antibody Screen: NEGATIVE

## 2017-03-06 SURGERY — LIGATION, FALLOPIAN TUBE, POSTPARTUM
Anesthesia: Epidural

## 2017-03-06 MED ORDER — MEPERIDINE HCL 25 MG/ML IJ SOLN: 6.2500 mg | INTRAMUSCULAR | Status: DC | PRN

## 2017-03-06 MED ORDER — ACETAMINOPHEN 325 MG PO TABS: 650.0000 mg | ORAL_TABLET | ORAL | Status: DC | PRN

## 2017-03-06 MED ORDER — PHENYLEPHRINE 40 MCG/ML (10ML) SYRINGE FOR IV PUSH (FOR BLOOD PRESSURE SUPPORT)
PREFILLED_SYRINGE | INTRAVENOUS | Status: AC
  Filled 2017-03-06: qty 20

## 2017-03-06 MED ORDER — BISACODYL 10 MG RE SUPP
10.0000 mg | Freq: Every day | RECTAL | Status: DC | PRN
  Filled 2017-03-06: qty 1

## 2017-03-06 MED ORDER — LIDOCAINE-EPINEPHRINE (PF) 2 %-1:200000 IJ SOLN
INTRAMUSCULAR | Status: AC
  Filled 2017-03-06: qty 40

## 2017-03-06 MED ORDER — SERTRALINE HCL 100 MG PO TABS
100.0000 mg | ORAL_TABLET | Freq: Every day | ORAL | Status: DC
  Administered 2017-03-08 (×2): 100 mg via ORAL
  Filled 2017-03-06 (×4): qty 1

## 2017-03-06 MED ORDER — KETOROLAC TROMETHAMINE 30 MG/ML IJ SOLN
30.0000 mg | Freq: Once | INTRAMUSCULAR | Status: DC | PRN
  Administered 2017-03-06: 30 mg via INTRAVENOUS

## 2017-03-06 MED ORDER — BUTORPHANOL TARTRATE 1 MG/ML IJ SOLN
1.0000 mg | Freq: Once | INTRAMUSCULAR | Status: AC
  Administered 2017-03-06: 1 mg via INTRAVENOUS

## 2017-03-06 MED ORDER — ONDANSETRON HCL 4 MG PO TABS: 4.0000 mg | ORAL_TABLET | ORAL | Status: DC | PRN

## 2017-03-06 MED ORDER — KETOROLAC TROMETHAMINE 30 MG/ML IJ SOLN
INTRAMUSCULAR | Status: AC
  Administered 2017-03-06: 30 mg via INTRAVENOUS
  Filled 2017-03-06: qty 1

## 2017-03-06 MED ORDER — LEVOTHYROXINE SODIUM 25 MCG PO TABS
25.0000 ug | ORAL_TABLET | Freq: Every day | ORAL | Status: DC
  Administered 2017-03-07: 25 ug via ORAL
  Filled 2017-03-06 (×4): qty 1

## 2017-03-06 MED ORDER — PHENYLEPHRINE 40 MCG/ML (10ML) SYRINGE FOR IV PUSH (FOR BLOOD PRESSURE SUPPORT): 80.0000 ug | PREFILLED_SYRINGE | INTRAVENOUS | Status: DC | PRN

## 2017-03-06 MED ORDER — LACTATED RINGERS IV SOLN: 500.0000 mL | Freq: Once | INTRAVENOUS | Status: DC

## 2017-03-06 MED ORDER — MISOPROSTOL 25 MCG QUARTER TABLET
25.0000 ug | ORAL_TABLET | ORAL | Status: DC | PRN
  Administered 2017-03-06: 25 ug via VAGINAL
  Filled 2017-03-06: qty 1

## 2017-03-06 MED ORDER — SODIUM BICARBONATE 8.4 % IV SOLN
INTRAVENOUS | Status: AC
  Filled 2017-03-06: qty 50

## 2017-03-06 MED ORDER — DIPHENHYDRAMINE HCL 50 MG/ML IJ SOLN: 12.5000 mg | INTRAMUSCULAR | Status: DC | PRN

## 2017-03-06 MED ORDER — DEXAMETHASONE SODIUM PHOSPHATE 4 MG/ML IJ SOLN
INTRAMUSCULAR | Status: AC
  Filled 2017-03-06: qty 1

## 2017-03-06 MED ORDER — LEVOTHYROXINE SODIUM 25 MCG PO TABS: 225.0000 ug | ORAL_TABLET | Freq: Two times a day (BID) | ORAL | Status: DC

## 2017-03-06 MED ORDER — ACETAMINOPHEN 325 MG PO TABS
650.0000 mg | ORAL_TABLET | ORAL | Status: DC | PRN
  Administered 2017-03-06: 650 mg via ORAL
  Filled 2017-03-06: qty 2

## 2017-03-06 MED ORDER — IBUPROFEN 600 MG PO TABS
600.0000 mg | ORAL_TABLET | Freq: Four times a day (QID) | ORAL | Status: DC
  Administered 2017-03-07 – 2017-03-08 (×6): 600 mg via ORAL
  Filled 2017-03-06 (×6): qty 1

## 2017-03-06 MED ORDER — ONDANSETRON HCL 4 MG/2ML IJ SOLN: 4.0000 mg | INTRAMUSCULAR | Status: DC | PRN

## 2017-03-06 MED ORDER — MIDAZOLAM HCL 2 MG/2ML IJ SOLN
INTRAMUSCULAR | Status: DC | PRN
  Administered 2017-03-06: 2 mg via INTRAVENOUS

## 2017-03-06 MED ORDER — OXYTOCIN 40 UNITS IN LACTATED RINGERS INFUSION - SIMPLE MED
2.5000 [IU]/h | INTRAVENOUS | Status: DC
  Filled 2017-03-06: qty 1000

## 2017-03-06 MED ORDER — LEVOTHYROXINE SODIUM 200 MCG PO TABS
400.0000 ug | ORAL_TABLET | Freq: Every day | ORAL | Status: DC
  Administered 2017-03-07: 400 ug via ORAL
  Administered 2017-03-08: 425 ug via ORAL
  Filled 2017-03-06 (×4): qty 2

## 2017-03-06 MED ORDER — DEXTROSE 5 % IV SOLN
INTRAVENOUS | Status: AC
  Filled 2017-03-06: qty 3000

## 2017-03-06 MED ORDER — PRENATAL MULTIVITAMIN CH
1.0000 | ORAL_TABLET | Freq: Every day | ORAL | Status: DC
  Administered 2017-03-07 – 2017-03-08 (×2): 1 via ORAL
  Filled 2017-03-06 (×2): qty 1

## 2017-03-06 MED ORDER — DIBUCAINE 1 % RE OINT: 1.0000 "application " | TOPICAL_OINTMENT | RECTAL | Status: DC | PRN

## 2017-03-06 MED ORDER — LIDOCAINE HCL (PF) 1 % IJ SOLN
INTRAMUSCULAR | Status: DC | PRN
  Administered 2017-03-06 (×2): 7 mL via EPIDURAL

## 2017-03-06 MED ORDER — SCOPOLAMINE 1 MG/3DAYS TD PT72
MEDICATED_PATCH | TRANSDERMAL | Status: DC | PRN
  Administered 2017-03-06: 1 via TRANSDERMAL

## 2017-03-06 MED ORDER — ONDANSETRON HCL 4 MG/2ML IJ SOLN
INTRAMUSCULAR | Status: AC
  Filled 2017-03-06: qty 2

## 2017-03-06 MED ORDER — DEXAMETHASONE SODIUM PHOSPHATE 4 MG/ML IJ SOLN
INTRAMUSCULAR | Status: DC | PRN
  Administered 2017-03-06: 4 mg via INTRAVENOUS

## 2017-03-06 MED ORDER — BENZOCAINE-MENTHOL 20-0.5 % EX AERO
1.0000 "application " | INHALATION_SPRAY | CUTANEOUS | Status: DC | PRN
  Administered 2017-03-06: 1 via TOPICAL
  Filled 2017-03-06: qty 56

## 2017-03-06 MED ORDER — DEXTROSE 5 % IV SOLN
5.0000 10*6.[IU] | Freq: Once | INTRAVENOUS | Status: DC
  Filled 2017-03-06: qty 5

## 2017-03-06 MED ORDER — MEDROXYPROGESTERONE ACETATE 150 MG/ML IM SUSP: 150.0000 mg | INTRAMUSCULAR | Status: DC | PRN

## 2017-03-06 MED ORDER — MEASLES, MUMPS & RUBELLA VAC ~~LOC~~ INJ
0.5000 mL | INJECTION | Freq: Once | SUBCUTANEOUS | Status: DC
  Filled 2017-03-06: qty 0.5

## 2017-03-06 MED ORDER — SOD CITRATE-CITRIC ACID 500-334 MG/5ML PO SOLN
30.0000 mL | Freq: Once | ORAL | Status: AC
  Administered 2017-03-06: 30 mL via ORAL

## 2017-03-06 MED ORDER — LIDOCAINE HCL (PF) 1 % IJ SOLN
30.0000 mL | INTRAMUSCULAR | Status: DC | PRN
  Filled 2017-03-06: qty 30

## 2017-03-06 MED ORDER — PENICILLIN G POT IN DEXTROSE 60000 UNIT/ML IV SOLN
3.0000 10*6.[IU] | INTRAVENOUS | Status: DC
  Filled 2017-03-06 (×3): qty 50

## 2017-03-06 MED ORDER — SCOPOLAMINE 1 MG/3DAYS TD PT72
MEDICATED_PATCH | TRANSDERMAL | Status: AC
  Filled 2017-03-06: qty 1

## 2017-03-06 MED ORDER — ONDANSETRON HCL 4 MG/2ML IJ SOLN
4.0000 mg | Freq: Four times a day (QID) | INTRAMUSCULAR | Status: DC | PRN
  Administered 2017-03-06: 4 mg via INTRAVENOUS
  Filled 2017-03-06: qty 2

## 2017-03-06 MED ORDER — TETANUS-DIPHTH-ACELL PERTUSSIS 5-2.5-18.5 LF-MCG/0.5 IM SUSP
0.5000 mL | Freq: Once | INTRAMUSCULAR | Status: DC
  Filled 2017-03-06: qty 0.5

## 2017-03-06 MED ORDER — FENTANYL CITRATE (PF) 100 MCG/2ML IJ SOLN
INTRAMUSCULAR | Status: DC | PRN
  Administered 2017-03-06: 100 ug via INTRAVENOUS

## 2017-03-06 MED ORDER — FLEET ENEMA 7-19 GM/118ML RE ENEM: 1.0000 | ENEMA | Freq: Every day | RECTAL | Status: DC | PRN

## 2017-03-06 MED ORDER — ZOLPIDEM TARTRATE 5 MG PO TABS
5.0000 mg | ORAL_TABLET | Freq: Every evening | ORAL | Status: DC | PRN
Start: 2017-03-06 — End: 2017-03-08

## 2017-03-06 MED ORDER — BUPIVACAINE HCL (PF) 0.25 % IJ SOLN
INTRAMUSCULAR | Status: AC
  Filled 2017-03-06: qty 30

## 2017-03-06 MED ORDER — OXYCODONE-ACETAMINOPHEN 5-325 MG PO TABS
1.0000 | ORAL_TABLET | ORAL | Status: DC | PRN
  Administered 2017-03-07 – 2017-03-08 (×8): 1 via ORAL
  Filled 2017-03-06 (×8): qty 1

## 2017-03-06 MED ORDER — LIDOCAINE-EPINEPHRINE (PF) 2 %-1:200000 IJ SOLN
INTRAMUSCULAR | Status: DC | PRN
  Administered 2017-03-06: 5 mL via PERINEURAL
  Administered 2017-03-06: 10 mL via PERINEURAL

## 2017-03-06 MED ORDER — FENTANYL CITRATE (PF) 100 MCG/2ML IJ SOLN
INTRAMUSCULAR | Status: AC
  Filled 2017-03-06: qty 2

## 2017-03-06 MED ORDER — HYDROMORPHONE HCL 1 MG/ML IJ SOLN
INTRAMUSCULAR | Status: AC
  Administered 2017-03-06: 0.5 mg via INTRAVENOUS
  Filled 2017-03-06: qty 0.5

## 2017-03-06 MED ORDER — PANTOPRAZOLE SODIUM 40 MG PO TBEC
40.0000 mg | DELAYED_RELEASE_TABLET | Freq: Every day | ORAL | Status: DC
  Administered 2017-03-07 – 2017-03-08 (×2): 40 mg via ORAL
  Filled 2017-03-06 (×2): qty 1

## 2017-03-06 MED ORDER — SOD CITRATE-CITRIC ACID 500-334 MG/5ML PO SOLN
ORAL | Status: AC
  Filled 2017-03-06: qty 15

## 2017-03-06 MED ORDER — OXYTOCIN BOLUS FROM INFUSION: 500.0000 mL | Freq: Once | INTRAVENOUS | Status: DC

## 2017-03-06 MED ORDER — EPHEDRINE 5 MG/ML INJ: 10.0000 mg | INTRAVENOUS | Status: DC | PRN

## 2017-03-06 MED ORDER — FENTANYL 2.5 MCG/ML BUPIVACAINE 1/10 % EPIDURAL INFUSION (WH - ANES)
14.0000 mL/h | INTRAMUSCULAR | Status: DC | PRN
  Administered 2017-03-06: 14 mL/h via EPIDURAL

## 2017-03-06 MED ORDER — LACTATED RINGERS IV SOLN: 500.0000 mL | INTRAVENOUS | Status: DC | PRN

## 2017-03-06 MED ORDER — LACTATED RINGERS IV SOLN
INTRAVENOUS | Status: DC | PRN
  Administered 2017-03-06: 14:00:00 via INTRAVENOUS

## 2017-03-06 MED ORDER — OXYCODONE-ACETAMINOPHEN 5-325 MG PO TABS: 1.0000 | ORAL_TABLET | ORAL | Status: DC | PRN

## 2017-03-06 MED ORDER — HYDROMORPHONE HCL 1 MG/ML IJ SOLN
0.2500 mg | INTRAMUSCULAR | Status: DC | PRN
Start: 2017-03-06 — End: 2017-03-06
  Administered 2017-03-06: 0.5 mg via INTRAVENOUS

## 2017-03-06 MED ORDER — WITCH HAZEL-GLYCERIN EX PADS: 1.0000 "application " | MEDICATED_PAD | CUTANEOUS | Status: DC | PRN

## 2017-03-06 MED ORDER — COCONUT OIL OIL: 1.0000 "application " | TOPICAL_OIL | Status: DC | PRN

## 2017-03-06 MED ORDER — SENNOSIDES-DOCUSATE SODIUM 8.6-50 MG PO TABS
2.0000 | ORAL_TABLET | ORAL | Status: DC
  Administered 2017-03-07 – 2017-03-08 (×2): 2 via ORAL
  Filled 2017-03-06 (×2): qty 2

## 2017-03-06 MED ORDER — OXYCODONE-ACETAMINOPHEN 5-325 MG PO TABS: 2.0000 | ORAL_TABLET | ORAL | Status: DC | PRN

## 2017-03-06 MED ORDER — ONDANSETRON HCL 4 MG/2ML IJ SOLN
INTRAMUSCULAR | Status: DC | PRN
  Administered 2017-03-06: 4 mg via INTRAVENOUS

## 2017-03-06 MED ORDER — FENTANYL 2.5 MCG/ML BUPIVACAINE 1/10 % EPIDURAL INFUSION (WH - ANES)
INTRAMUSCULAR | Status: AC
  Filled 2017-03-06: qty 100

## 2017-03-06 MED ORDER — LACTATED RINGERS IV SOLN
INTRAVENOUS | Status: DC | PRN
  Administered 2017-03-06: 13:00:00 via INTRAVENOUS

## 2017-03-06 MED ORDER — DIPHENHYDRAMINE HCL 25 MG PO CAPS: 25.0000 mg | ORAL_CAPSULE | Freq: Four times a day (QID) | ORAL | Status: DC | PRN

## 2017-03-06 MED ORDER — TERBUTALINE SULFATE 1 MG/ML IJ SOLN: 0.2500 mg | Freq: Once | INTRAMUSCULAR | Status: DC | PRN

## 2017-03-06 MED ORDER — BUTORPHANOL TARTRATE 1 MG/ML IJ SOLN
INTRAMUSCULAR | Status: AC
  Filled 2017-03-06: qty 1

## 2017-03-06 MED ORDER — DEXTROSE 5 % IV SOLN
INTRAVENOUS | Status: DC | PRN
  Administered 2017-03-06: 3 g via INTRAVENOUS

## 2017-03-06 MED ORDER — MIDAZOLAM HCL 2 MG/2ML IJ SOLN
INTRAMUSCULAR | Status: AC
  Filled 2017-03-06: qty 2

## 2017-03-06 MED ORDER — SOD CITRATE-CITRIC ACID 500-334 MG/5ML PO SOLN: 30.0000 mL | ORAL | Status: DC | PRN

## 2017-03-06 MED ORDER — PROMETHAZINE HCL 25 MG/ML IJ SOLN: 6.2500 mg | INTRAMUSCULAR | Status: DC | PRN

## 2017-03-06 MED ORDER — OXYTOCIN 40 UNITS IN LACTATED RINGERS INFUSION - SIMPLE MED: 1.0000 m[IU]/min | INTRAVENOUS | Status: DC

## 2017-03-06 MED ORDER — SIMETHICONE 80 MG PO CHEW
80.0000 mg | CHEWABLE_TABLET | ORAL | Status: DC | PRN
Start: 2017-03-06 — End: 2017-03-08

## 2017-03-06 MED ORDER — LACTATED RINGERS IV SOLN
INTRAVENOUS | Status: DC
  Administered 2017-03-06 (×2): via INTRAVENOUS

## 2017-03-06 SURGICAL SUPPLY — 26 items
ADH SKN CLS LQ APL DERMABOND (GAUZE/BANDAGES/DRESSINGS) ×1
CLOTH BEACON ORANGE TIMEOUT ST (SAFETY) ×2 IMPLANT
CONTAINER PREFILL 10% NBF 15ML (MISCELLANEOUS) ×4 IMPLANT
DERMABOND ADHESIVE PROPEN (GAUZE/BANDAGES/DRESSINGS) ×1
DERMABOND ADVANCED .7 DNX6 (GAUZE/BANDAGES/DRESSINGS) IMPLANT
DRSG OPSITE POSTOP 3X4 (GAUZE/BANDAGES/DRESSINGS) ×2 IMPLANT
DURAPREP 26ML APPLICATOR (WOUND CARE) ×2 IMPLANT
GLOVE BIO SURGEON STRL SZ 6.5 (GLOVE) ×2 IMPLANT
GLOVE BIOGEL PI IND STRL 7.0 (GLOVE) ×1 IMPLANT
GLOVE BIOGEL PI INDICATOR 7.0 (GLOVE) ×1
GOWN STRL REUS W/TWL LRG LVL3 (GOWN DISPOSABLE) ×4 IMPLANT
NEEDLE HYPO 22GX1.5 SAFETY (NEEDLE) IMPLANT
NS IRRIG 1000ML POUR BTL (IV SOLUTION) ×2 IMPLANT
PACK ABDOMINAL MINOR (CUSTOM PROCEDURE TRAY) ×2 IMPLANT
PROTECTOR NERVE ULNAR (MISCELLANEOUS) ×2 IMPLANT
SPONGE LAP 4X18 X RAY DECT (DISPOSABLE) IMPLANT
SUT PLAIN 0 NONE (SUTURE) ×2 IMPLANT
SUT VIC AB 2-0 CT1 (SUTURE) ×2 IMPLANT
SUT VIC AB 3-0 PS2 18 (SUTURE) ×2
SUT VIC AB 3-0 PS2 18XBRD (SUTURE) ×1 IMPLANT
SYR CONTROL 10ML LL (SYRINGE) IMPLANT
TOWEL OR 17X24 6PK STRL BLUE (TOWEL DISPOSABLE) ×4 IMPLANT
TRAY FOLEY CATH SILVER 14FR (SET/KITS/TRAYS/PACK) ×2 IMPLANT
TUBING NON-CON 1/4 X 20 CONN (TUBING) ×1 IMPLANT
WATER STERILE IRR 1000ML POUR (IV SOLUTION) ×2 IMPLANT
YANKAUER SUCT BULB TIP NO VENT (SUCTIONS) ×1 IMPLANT

## 2017-03-06 NOTE — Brief Op Note (Signed)
03/06/2017  2:02 PM  PATIENT:  [name]  41 y.o. female  PRE-OPERATIVE DIAGNOSIS:   Desires permanent sterilization  POST-OPERATIVE DIAGNOSIS: Unsuccessful tubal ligation secondary to dense tubal adhesions  PROCEDURE:  Procedure(s): POST PARTUM TUBAL LIGATION (N/A)   UNSUCCESSFUL  SURGEON:  Surgeon(s) and Role:    * Dian Queen, MD - Primary  PHYSICIAN ASSISTANT:   ASSISTANTS: none   ANESTHESIA:   epidural  EBL:  Total I/O In: 1200 [I.V.:1200] Out: 670 [Urine:350; Blood:320]  BLOOD ADMINISTERED:none  DRAINS: none   LOCAL MEDICATIONS USED:  NONE  SPECIMEN:  No Specimen  DISPOSITION OF SPECIMEN:  N/A  COUNTS:  YES  TOURNIQUET:  * No tourniquets in log *  DICTATION: .Other Dictation: Dictation Number [id number]  PLAN OF CARE: Admit to inpatient   PATIENT DISPOSITION:  PACU - hemodynamically stable.   Delay start of Pharmacological VTE agent (>24hrs) due to surgical blood loss or risk of bleeding: not applicable

## 2017-03-06 NOTE — Anesthesia Postprocedure Evaluation (Signed)
Anesthesia Post Note  Patient: [name]  Procedure(s) Performed: Procedure(s) (LRB): POST PARTUM TUBAL LIGATION (N/A)     Anesthesia Post Evaluation  Last Vitals:  Vitals:   03/06/17 1602 03/06/17 1710  BP: 111/68 129/72  Pulse: (!) 59 66  Resp: 18 18  Temp: 36.7 C 36.6 C    Last Pain:  Vitals:   03/06/17 1710  TempSrc: Oral  PainSc: 0-No pain   Pain Goal: Patients Stated Pain Goal: 3 (03/06/17 1602)               [place]

## 2017-03-06 NOTE — Transfer of Care (Signed)
Immediate Anesthesia Transfer of Care Note  Patient: [name]  Procedure(s) Performed: Procedure(s): POST PARTUM TUBAL LIGATION (N/A)  Patient Location: PACU  Anesthesia Type:Epidural  Level of Consciousness: awake, alert  and oriented  Airway & Oxygen Therapy: Patient Spontanous Breathing  Post-op Assessment: Report given to RN and Post -op Vital signs reviewed and stable  Post vital signs: Reviewed and stable  Last Vitals:  Vitals:   03/06/17 1201 03/06/17 1244  BP: (!) 121/57   Pulse: 63   Resp:    Temp:  36.9 C    Last Pain:  Vitals:   03/06/17 1244  TempSrc: Oral  PainSc:          Complications: No apparent anesthesia complications

## 2017-03-06 NOTE — H&P (Signed)
[name] is a 41 y.o.G 7 P 4 presented for induction last night.  Status post cytotec and now status post epidural Desires post partum tubal ligation  OB History    Gravida Para Term Preterm AB Living   7 4 4   2 4    SAB TAB Ectopic Multiple Live Births   1 1   0 4     Past Medical History:  Diagnosis Date  . AMA (advanced maternal age) multigravida 44+   . Anemia    with iron infusions  . Anxiety    panic attacks  . Cancer [facility]) 2010   thyroidectomy  . Dysrhythmia    no meds currently- palpitations assoc with panic disorder or thyroid disease  . GERD (gastroesophageal reflux disease)   . Headache(784.0)   . Hypothyroidism   . PCOS (polycystic ovarian syndrome)   . PONV (postoperative nausea and vomiting)   . Shortness of breath    on exertion- due to weight  . Thyroid disease    Past Surgical History:  Procedure Laterality Date  . bilateral arm lift surgery  2005  . CHOLECYSTECTOMY  11/01/2011   Procedure: LAPAROSCOPIC CHOLECYSTECTOMY;  Surgeon: Donato Heinz, MD;  Location: [facility];  Service: General;  Laterality: N/A;  . colonscopy    . DIAGNOSTIC LAPAROSCOPY  1994   x 2  . DILATION AND CURETTAGE OF UTERUS  10/12/2011   Procedure: DILATATION AND CURETTAGE;  Surgeon: Cyril Mourning, MD;  Location: [place] [facility];  Service: Gynecology;  Laterality: N/A;  . DILATION AND CURETTAGE OF UTERUS N/A 08/19/2013   Procedure: DILATATION AND CURETTAGE;  Surgeon: Cyril Mourning, MD;  Location: [place] [facility];  Service: Gynecology;  Laterality: N/A;  . 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 GASTRIC BYPASS    . OVARIAN CYST REMOVAL  '95  . removal of IUD    . right tube removed & ovarian cyst '11    . THYROIDECTOMY    . TONSILLECTOMY AND ADENOIDECTOMY     Family History: family history includes Breast cancer in her paternal grandmother; Diabetes in her paternal grandmother; Heart attack in her father; Heart disease in her father, paternal  grandfather, and paternal grandmother; Hypertension in her father; Stroke in her paternal grandfather. Social History:  reports that she quit smoking about 12 years ago. Her smoking use included Cigarettes. She has a 5.00 pack-year smoking history. She has never used smokeless tobacco. She reports that she drinks alcohol. She reports that she does not use drugs.     Maternal Diabetes: No Genetic Screening: Normal Maternal Ultrasounds/Referrals: Normal Fetal Ultrasounds or other Referrals:  None Maternal Substance Abuse:  No Significant Maternal Medications:  None Significant Maternal Lab Results:  None Other Comments:  None  Review of Systems  All other systems reviewed and are negative.  History Dilation: 4 Effacement (%): 80 Station: -2 Exam by:: Mammie Russian, RN Blood pressure (!) 142/85, pulse 76, temperature 98.3 F (36.8 C), temperature source Oral, resp. rate 18, height 5\' 5"  (1.651 m), weight 127 kg (280 lb), SpO2 98 %, unknown if currently breastfeeding. Exam Physical Exam  Nursing note and vitals reviewed. Constitutional: She appears well-developed and well-nourished.  HENT:  Head: Normocephalic.  Eyes: Pupils are equal, round, and reactive to light.  Neck: Normal range of motion.    Prenatal labs: ABO, Rh: --/--/A POS (07/10 0110) Antibody: NEG (07/10 0110) Rubella: Immune (12/13 0000)  RPR: Nonreactive (12/13 0000)  HBsAg: Negative (12/13 0000)  HIV: Non-reactive (12/13 0000)  GBS: Negative (06/13 0000)   Assessment/Plan: IUP at term Anticipate NSVD Desires post partum tubal ligation   Bao Coreas L 03/06/2017, 9:22 AM

## 2017-03-06 NOTE — Anesthesia Preprocedure Evaluation (Signed)
Anesthesia Evaluation  Patient identified by MRN, date of birth, ID band Patient awake    Reviewed: Allergy & Precautions, H&P , NPO status , Patient's Chart, lab work & pertinent test results  Airway Mallampati: II  TM Distance: >3 FB Neck ROM: full    Dental no notable dental hx.    Pulmonary former smoker,    Pulmonary exam normal breath sounds clear to auscultation       Cardiovascular negative cardio ROS Normal cardiovascular exam     Neuro/Psych    GI/Hepatic negative GI ROS, Neg liver ROS, GERD  Medicated,  Endo/Other  Hypothyroidism Morbid obesity  Renal/GU negative Renal ROS     Musculoskeletal   Abdominal (+) + obese,   Peds  Hematology  (+) anemia ,   Anesthesia Other Findings   Reproductive/Obstetrics (+) Pregnancy                             Anesthesia Physical Anesthesia Plan  ASA: III  Anesthesia Plan: Epidural   Post-op Pain Management:    Induction:   PONV Risk Score and Plan:   Airway Management Planned:   Additional Equipment:   Intra-op Plan:   Post-operative Plan:   Informed Consent: I have reviewed the patients History and Physical, chart, labs and discussed the procedure including the risks, benefits and alternatives for the proposed anesthesia with the patient or authorized representative who has indicated his/her understanding and acceptance.     Plan Discussed with:   Anesthesia Plan Comments:         Anesthesia Quick Evaluation

## 2017-03-06 NOTE — Anesthesia Procedure Notes (Signed)
Epidural Patient location during procedure: OB Start time: 03/06/2017 8:39 AM End time: 03/06/2017 8:43 AM  Staffing Anesthesiologist: Lyn Hollingshead Performed: anesthesiologist   Preanesthetic Checklist Completed: patient identified, surgical consent, pre-op evaluation, timeout performed, IV checked, risks and benefits discussed and monitors and equipment checked  Epidural Patient position: sitting Prep: site prepped and draped and DuraPrep Patient monitoring: continuous pulse ox and blood pressure Approach: midline Location: L3-L4 Injection technique: LOR air  Needle:  Needle type: Tuohy  Needle gauge: 17 G Needle length: 9 cm and 9 Needle insertion depth: 6 cm Catheter type: closed end flexible Catheter size: 19 Gauge Catheter at skin depth: 11 cm Test dose: negative and Other  Assessment Sensory level: T10 Events: blood not aspirated, injection not painful, no injection resistance, negative IV test and no paresthesia

## 2017-03-06 NOTE — Op Note (Signed)
[name], [name]          ACCOUNT NO.:  [id number]  MEDICAL RECORD NO.:  [id number]  LOCATION:  9169                          FACILITY:  [facility]  PHYSICIAN:  Broughton Eppinger L. Benicia Bergevin, M.D.DATE OF BIRTH:  June 08, 1976  DATE OF PROCEDURE:  03/06/2017 DATE OF DISCHARGE:                              OPERATIVE REPORT   PREOPERATIVE DIAGNOSIS:  Desires permanent sterilization.  POSTOPERATIVE DIAGNOSIS:  Desires permanent sterilization, but unsuccessful because of dense adhesions involving the left fallopian tube.  PROCEDURE:  Attempted tubal ligation, but unsuccessful.  SURGEON:  Bearl Talarico L. Helane Rima, M.D.  ANESTHESIA:  Epidural.  ESTIMATED BLOOD LOSS:  Minimal.  COMPLICATIONS:  None.  DESCRIPTION OF PROCEDURE:  The patient was taken to the operating room. Her epidural dose was found to be adequate.  She was then prepped and draped in the usual fashion.  A Foley catheter had been inserted.  Allis clamps were placed and __________ the fascia was lifted up. __________ entered the peritoneum.  I then visualized the ovary.  I then was able to palpate __________ right fallopian tube.  On the left side, there were dense adhesions involving the fallopian tube __________ a portion of the fallopian tube because __________ fimbriated end to me appeared to be literally clubbed and I am not totally sure of the fallopian tube course.  The adhesions were __________ to do tubal ligation __________ course of the fallopian tube with a 100% confidence.  I do not think she is a good candidate for a tubal ligation __________.  Hemostasis was very good.  We closed the peritoneum using 0 Vicryl and closed the fascia using 0 Vicryl __________     Erik Obey. Helane Rima, M.D.     Nevin Bloodgood  D:  03/06/2017  T:  03/06/2017  Job:  [id number]

## 2017-03-06 NOTE — Lactation Note (Signed)
This note was copied from a baby's chart. Lactation Consultation Note  Patient Name: Boy Mikaia Janvier [id number] Date: 03/06/2017 Reason for consult: Initial assessment Infant is 67 hours old & seen by [facility] for initial assessment. Baby was born at [redacted]w[redacted]d and weighed 7lbs 10 oz at birth. Room was full of visitors and baby was with a visitor when [facility] entered but mom stated it was ok for [facility] to come in. Mom reports when baby did latch earlier, it went well with no pain and then she gave formula at the next feeding because she had recently taken pain meds and did not want to breastfeed right after taking the meds. Mom reports she recently finished breastfeeding for one year with her 53 year old child and states she has no questions or concerns about BF this baby. Provided mom with BF booklet, BF resources, and feeding log; mom made aware of O/P services, breastfeeding support groups, community resources, and our phone # for post-discharge questions.  Mom encouraged to feed baby 8-12 times/24 hours and with feeding cues.  Encouraged mom to ask for help from her RN tonight or Lactation tomorrow. Mom reports no questions at this time.   Maternal Data Does the patient have breastfeeding experience prior to this delivery?: Yes: last child- 1 yr (previous children- mom states she did no BF long)  Feeding    LATCH Score/Interventions                      Lactation Tools Discussed/Used     Consult Status Consult Status: Follow-up Date: 03/07/17 Follow-up type: In-patient    Yvonna Alanis 03/06/2017, 7:29 PM

## 2017-03-06 NOTE — Anesthesia Preprocedure Evaluation (Signed)
Anesthesia Evaluation  Patient identified by MRN, date of birth, ID band Patient awake    Reviewed: Allergy & Precautions, H&P , NPO status , Patient's Chart, lab work & pertinent test results  History of Anesthesia Complications (+) PONV  Airway Mallampati: II  TM Distance: >3 FB Neck ROM: full    Dental no notable dental hx. (+) Teeth Intact   Pulmonary former smoker,    Pulmonary exam normal breath sounds clear to auscultation       Cardiovascular negative cardio ROS Normal cardiovascular exam Rhythm:Regular Rate:Normal     Neuro/Psych    GI/Hepatic Neg liver ROS, GERD  Medicated,  Endo/Other  Hypothyroidism Morbid obesity  Renal/GU negative Renal ROS     Musculoskeletal   Abdominal (+) + obese,   Peds  Hematology  (+) Blood dyscrasia, anemia ,   Anesthesia Other Findings   Reproductive/Obstetrics (+) Pregnancy                             Anesthesia Physical  Anesthesia Plan  ASA: III  Anesthesia Plan: Epidural   Post-op Pain Management:    Induction:   PONV Risk Score and Plan: 4 or greater and Ondansetron, Dexamethasone, Propofol, Midazolam, Scopolamine patch - Pre-op and Treatment may vary due to age or medical condition  Airway Management Planned:   Additional Equipment:   Intra-op Plan:   Post-operative Plan:   Informed Consent: I have reviewed the patients History and Physical, chart, labs and discussed the procedure including the risks, benefits and alternatives for the proposed anesthesia with the patient or authorized representative who has indicated his/her understanding and acceptance.     Plan Discussed with: Surgeon  Anesthesia Plan Comments: (Using labor epidural for PPTL.)        Anesthesia Quick Evaluation

## 2017-03-06 NOTE — Progress Notes (Signed)
MOB was referred for history of depression/anxiety. * Referral screened out by Clinical Social Worker because none of the following criteria appear to apply: ~ History of anxiety/depression during this pregnancy, or of post-partum depression. ~ Diagnosis of anxiety and/or depression within last 3 years AND * MOB's symptoms currently being treated with medication and/or therapy. Please contact the Clinical Social Worker if needs arise, or if MOB requests.  MOB has Rx for Zoloft.

## 2017-03-07 LAB — CBC
HCT: 32.4 % — ABNORMAL LOW (ref 36.0–46.0)
Hemoglobin: 10.5 g/dL — ABNORMAL LOW (ref 12.0–15.0)
MCH: 27.6 pg (ref 26.0–34.0)
MCHC: 32.4 g/dL (ref 30.0–36.0)
MCV: 85 fL (ref 78.0–100.0)
Platelets: 275 10*3/uL (ref 150–400)
RBC: 3.81 MIL/uL — ABNORMAL LOW (ref 3.87–5.11)
RDW: 22.5 % — ABNORMAL HIGH (ref 11.5–15.5)
WBC: 11.7 10*3/uL — ABNORMAL HIGH (ref 4.0–10.5)

## 2017-03-07 NOTE — Progress Notes (Signed)
Post Partum Day 1 s/p SVD and attempted tubal ligation Subjective: no complaints, up ad lib, voiding and tolerating PO  Objective: Blood pressure (!) 112/59, pulse 66, temperature 97.8 F (36.6 C), temperature source Oral, resp. rate 16, height 5\' 5"  (1.651 m), weight 280 lb (127 kg), SpO2 98 %, unknown if currently breastfeeding.  Physical Exam:  General: alert and cooperative Lochia: appropriate Uterine Fundus: firm Incision: healing well, no significant drainage DVT Evaluation: No evidence of DVT seen on physical exam.   Recent Labs  03/06/17 0110 03/07/17 0506  HGB 12.7 10.5*  HCT 38.3 32.4*    Assessment/Plan: Plan for discharge tomorrow  Desires circ   LOS: 1 day   [name] 03/07/2017, 8:03 AM

## 2017-03-07 NOTE — Lactation Note (Signed)
This note was copied from a baby's chart. Lactation Consultation Note: Mother reports that breastfeeding is going well. She states that she breastfed her 39 yr old for one year and she doesn't need any assistance. Mother is supplementing with formula . Offered to see mother tomorrow if desired.   Patient Name: [name] [id number] Date: 03/07/2017 Reason for consult: Follow-up assessment   Maternal Data    Feeding    LATCH Score/Interventions                      Lactation Tools Discussed/Used     Consult Status Consult Status: Follow-up Date: 03/07/17 Follow-up type: In-patient    Jess Barters [facility] 03/07/2017, 2:36 PM

## 2017-03-07 NOTE — Addendum Note (Signed)
Addendum  created 03/07/17 0817 by Raenette Rover, CRNA   Sign clinical note

## 2017-03-07 NOTE — Anesthesia Postprocedure Evaluation (Signed)
Anesthesia Post Note  Patient: [name]  Procedure(s) Performed: * No procedures listed *     Patient location during evaluation: Mother Baby Anesthesia Type: Epidural Level of consciousness: awake, awake and alert, oriented and patient cooperative Pain management: pain level controlled Vital Signs Assessment: post-procedure vital signs reviewed and stable Respiratory status: spontaneous breathing, nonlabored ventilation and respiratory function stable Cardiovascular status: stable Postop Assessment: no headache, no backache, patient able to bend at knees and no signs of nausea or vomiting Anesthetic complications: no    Last Vitals:  Vitals:   03/07/17 0145 03/07/17 0652  BP: 110/60 (!) 112/59  Pulse: 63 66  Resp: 18 16  Temp: 36.9 C 36.6 C    Last Pain:  Vitals:   03/07/17 0652  TempSrc: Oral  PainSc: 4    Pain Goal: Patients Stated Pain Goal: 3 (03/06/17 1602)               Denarius Sesler L

## 2017-03-07 NOTE — Anesthesia Postprocedure Evaluation (Signed)
Anesthesia Post Note  Patient: [name]  Procedure(s) Performed: Procedure(s) (LRB): POST PARTUM TUBAL LIGATION (N/A)     Patient location during evaluation: Mother Baby Anesthesia Type: Epidural Level of consciousness: awake, awake and alert, oriented and patient cooperative Pain management: pain level controlled Vital Signs Assessment: post-procedure vital signs reviewed and stable Respiratory status: spontaneous breathing, nonlabored ventilation and respiratory function stable Cardiovascular status: stable Postop Assessment: no headache, no backache, no signs of nausea or vomiting and patient able to bend at knees Anesthetic complications: no    Last Vitals:  Vitals:   03/07/17 0145 03/07/17 0652  BP: 110/60 (!) 112/59  Pulse: 63 66  Resp: 18 16  Temp: 36.9 C 36.6 C    Last Pain:  Vitals:   03/07/17 0652  TempSrc: Oral  PainSc: 4    Pain Goal: Patients Stated Pain Goal: 3 (03/06/17 1602)               Lanai Conlee L

## 2017-03-08 LAB — RUBELLA SCREEN: Rubella: 2.25 index (ref >0.99)

## 2017-03-08 MED ORDER — IBUPROFEN 600 MG PO TABS: 600.0000 mg | ORAL_TABLET | Freq: Four times a day (QID) | ORAL | 0 refills | Status: DC | PRN

## 2017-03-08 MED ORDER — OXYCODONE-ACETAMINOPHEN 5-325 MG PO TABS: 1.0000 | ORAL_TABLET | ORAL | 0 refills | Status: DC | PRN

## 2017-03-08 NOTE — Discharge Summary (Signed)
Obstetric Discharge Summary Reason for Admission: induction of labor Prenatal Procedures: none Intrapartum Procedures: spontaneous vaginal delivery Postpartum Procedures: attempted but failed tubal ligation Complications-Operative and Postpartum: none Hemoglobin  Date Value Ref Range Status  03/07/2017 10.5 (L) 12.0 - 15.0 g/dL Final   HCT  Date Value Ref Range Status  03/07/2017 32.4 (L) 36.0 - 46.0 % Final    Physical Exam:  General: alert, cooperative and no distress Lochia: appropriate Uterine Fundus: firm Incision: healing well DVT Evaluation: No evidence of DVT seen on physical exam.  Discharge Diagnoses: Term Pregnancy-delivered  Discharge Information: Date: 03/08/2017 Activity: pelvic rest Diet: routine Medications: PNV, Ibuprofen and Percocet Condition: stable Instructions: refer to practice specific booklet Discharge to: home   Newborn Data: Live born female  Birth Weight: 7 lb 10.8 oz (3481 g) APGAR: 9, 9  Home with mother.  [name],[name] 03/08/2017, 7:59 AM

## 2017-03-08 NOTE — Lactation Note (Signed)
This note was copied from a baby's chart. Lactation Consultation Note  Patient Name: [name] Date: 03/08/2017 Reason for consult: Follow-up assessment   Follow up with mom of 30 hour old infant. Infant with 2 BF attempts for 1-4 minutes, 5 bottles of formula of 20-30 ml, 1 void and 1 stool in last 24 hours. Infant weight 7 lb 3.3 oz with 6% weight loss since birth. Infant laying beside mom in bed and sucking on a pacifier.   Mom reports she has no questions/concerns and does not need assistance with BF. Mom is to order a breast pump. Manual pump give, mom said she has used before and did not need demonstration. Reviewed engorgement prevention/treatment with mom. Enc mom to call with questions/concerns prn.   Maternal Data Formula Feeding for Exclusion: Yes Reason for exclusion: Mother's choice to formula and breast feed on admission Does the patient have breastfeeding experience prior to this delivery?: Yes  Feeding Feeding Type: Bottle Fed - Formula Nipple Type: Slow - flow  LATCH Score/Interventions                      Lactation Tools Discussed/Used     Consult Status Consult Status: Complete Follow-up type: Call as needed    Donn Pierini 03/08/2017, 11:23 AM

## 2017-03-13 ENCOUNTER — Inpatient Hospital Stay (HOSPITAL_COMMUNITY)
Admission: AD | Admit: 2017-03-13 | Discharge: 2017-03-13 | Disposition: A | Discharge status: Home / Self Care | Payer: BLUE CROSS/BLUE SHIELD | Source: Ambulatory Visit | Attending: Obstetrics and Gynecology | Admitting: Obstetrics and Gynecology

## 2017-03-13 ENCOUNTER — Encounter (HOSPITAL_COMMUNITY): Payer: Self-pay | Admitting: *Deleted

## 2017-03-13 DIAGNOSIS — T814XXA Infection following a procedure, initial encounter: Secondary | ICD-10-CM | POA: Diagnosis not present

## 2017-03-13 DIAGNOSIS — Z87891 Personal history of nicotine dependence: Secondary | ICD-10-CM | POA: Insufficient documentation

## 2017-03-13 DIAGNOSIS — T8140XA Infection following a procedure, unspecified, initial encounter: Secondary | ICD-10-CM

## 2017-03-13 DIAGNOSIS — L03316 Cellulitis of umbilicus: Secondary | ICD-10-CM | POA: Insufficient documentation

## 2017-03-13 LAB — CBC WITH DIFFERENTIAL/PLATELET
Basophils Absolute: 0 10*3/uL (ref 0.0–0.1)
Basophils Relative: 0 %
Eosinophils Absolute: 0.1 10*3/uL (ref 0.0–0.7)
Eosinophils Relative: 2 %
HCT: 37.2 % (ref 36.0–46.0)
Hemoglobin: 11.8 g/dL — ABNORMAL LOW (ref 12.0–15.0)
Lymphocytes Relative: 31 %
Lymphs Abs: 2.3 10*3/uL (ref 0.7–4.0)
MCH: 27.4 pg (ref 26.0–34.0)
MCHC: 31.7 g/dL (ref 30.0–36.0)
MCV: 86.3 fL (ref 78.0–100.0)
Monocytes Absolute: 0.2 10*3/uL (ref 0.1–1.0)
Monocytes Relative: 2 %
Neutro Abs: 4.7 10*3/uL (ref 1.7–7.7)
Neutrophils Relative %: 65 %
Platelets: 311 10*3/uL (ref 150–400)
RBC: 4.31 MIL/uL (ref 3.87–5.11)
RDW: 21 % — ABNORMAL HIGH (ref 11.5–15.5)
WBC: 7.4 10*3/uL (ref 4.0–10.5)

## 2017-03-13 MED ORDER — CEPHALEXIN 500 MG PO CAPS: 500.0000 mg | ORAL_CAPSULE | Freq: Two times a day (BID) | ORAL | 0 refills | Status: AC

## 2017-03-13 MED ORDER — OXYCODONE-ACETAMINOPHEN 5-325 MG PO TABS: 1.0000 | ORAL_TABLET | Freq: Four times a day (QID) | ORAL | 0 refills | Status: DC | PRN

## 2017-03-13 NOTE — MAU Provider Note (Signed)
Chief Complaint: Post-op Problem   First Provider Initiated Contact with Patient 03/13/17 1852      SUBJECTIVE HPI: [name] is a 41 y.o. [id number] on POD#8 following BTL who presents to maternity admissions reporting onset of clear/bloody fluid today from her umbilical incision. She reports pain since the surgery but this is unchanged and is well controlled with Percocet.  She has not tried any treatments, nothing makes her symptoms better or worse.  There are no associated symptoms. She denies vaginal itching/burning, urinary symptoms, h/a, dizziness, n/v, or fever/chills.     HPI  Past Medical History:  Diagnosis Date  . AMA (advanced maternal age) multigravida 75+   . Anemia    with iron infusions  . Anxiety    panic attacks  . Cancer [facility]) 2010   thyroidectomy  . Dysrhythmia    no meds currently- palpitations assoc with panic disorder or thyroid disease  . GERD (gastroesophageal reflux disease)   . Headache(784.0)   . Hypothyroidism   . PCOS (polycystic ovarian syndrome)   . PONV (postoperative nausea and vomiting)   . Shortness of breath    on exertion- due to weight  . Thyroid disease    Past Surgical History:  Procedure Laterality Date  . bilateral arm lift surgery  2005  . CHOLECYSTECTOMY  11/01/2011   Procedure: LAPAROSCOPIC CHOLECYSTECTOMY;  Surgeon: Donato Heinz, MD;  Location: [facility];  Service: General;  Laterality: N/A;  . colonscopy    . DIAGNOSTIC LAPAROSCOPY  1994   x 2  . DILATION AND CURETTAGE OF UTERUS  10/12/2011   Procedure: DILATATION AND CURETTAGE;  Surgeon: Cyril Mourning, MD;  Location: [facility];  Service: Gynecology;  Laterality: N/A;  . DILATION AND CURETTAGE OF UTERUS N/A 08/19/2013   Procedure: DILATATION AND CURETTAGE;  Surgeon: Cyril Mourning, MD;  Location: [facility];  Service: Gynecology;  Laterality: N/A;  . 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 GASTRIC BYPASS    . OVARIAN CYST REMOVAL   '95  . removal of IUD    . right tube removed & ovarian cyst '11    . THYROIDECTOMY    . TONSILLECTOMY AND ADENOIDECTOMY    . TUBAL LIGATION N/A 03/06/2017   Procedure: POST PARTUM TUBAL LIGATION;  Surgeon: Dian Queen, MD;  Location: [facility];  Service: Gynecology;  Laterality: N/A;   Social History   Social History  . Marital status: Married    Spouse name: N/A  . Number of children: N/A  . Years of education: N/A   Occupational History  . Not on file.   Social History Main Topics  . Smoking status: Former Smoker    Packs/day: 1.00    Years: 5.00    Types: Cigarettes    Quit date: 01/26/2005  . Smokeless tobacco: Never Used  . Alcohol use Yes     Comment: occasionally, but not while pregnant  . Drug use: No  . Sexual activity: Yes    Birth control/ protection: None   Other Topics Concern  . Not on file   Social History Narrative  . No narrative on file   No current facility-administered medications on file prior to encounter.    Current Outpatient Prescriptions on File Prior to Encounter  Medication Sig Dispense Refill  . acetaminophen (TYLENOL) 500 MG tablet Take 500 mg by mouth every 6 (six) hours as needed for mild pain, moderate  pain, fever or headache.     . levothyroxine (SYNTHROID, LEVOTHROID) 200 MCG tablet Take 400 mcg by mouth daily before breakfast. Pt takes with a 26mcg tablet.    Marland Kitchen levothyroxine (SYNTHROID, LEVOTHROID) 25 MCG tablet Take 25 mcg by mouth daily before breakfast. Pt takes with two 249mcg tablets.    Marland Kitchen omeprazole (PRILOSEC) 40 MG capsule Take 40 mg by mouth daily as needed (for acid reflux).   3  . Prenatal Vit-Fe Fumarate-FA (PRENATAL MULTIVITAMIN) TABS tablet Take 1 tablet by mouth at bedtime.     . sertraline (ZOLOFT) 100 MG tablet Take 100 mg by mouth at bedtime.      No Known Allergies  ROS:  Review of Systems  Constitutional: Negative for chills, fatigue and fever.  Respiratory: Negative for shortness of breath.    Cardiovascular: Negative for chest pain.  Gastrointestinal: Positive for abdominal pain.  Genitourinary: Negative for difficulty urinating, dysuria, flank pain, pelvic pain, vaginal bleeding, vaginal discharge and vaginal pain.  Neurological: Negative for dizziness and headaches.  Psychiatric/Behavioral: Negative.      I have reviewed patient's Past Medical Hx, Surgical Hx, Family Hx, Social Hx, medications and allergies.   Physical Exam  Patient Vitals for the past 24 hrs:  BP Temp Temp src Pulse Resp SpO2  03/13/17 1732 124/85 98.3 F (36.8 C) Oral 97 18 99 %   Constitutional: Well-developed, well-nourished female in no acute distress.  Cardiovascular: normal rate Respiratory: normal effort GI: Abd soft, non-tender. Pos BS x 4 Skin: Incision well approximated with mild erythema in surrounding 4-5 cm area around incision, pale yellow/clear drainage noted at site and on gauze brought by pt MS: Extremities nontender, no edema, normal ROM Neurologic: Alert and oriented x 4.  GU: Neg CVAT.   LAB RESULTS Results for orders placed or performed during the hospital encounter of 03/13/17 (from the past 24 hour(s))  CBC with Differential/Platelet     Status: Abnormal   Collection Time: 03/13/17  6:29 PM  Result Value Ref Range   WBC 7.4 4.0 - 10.5 K/uL   RBC 4.31 3.87 - 5.11 MIL/uL   Hemoglobin 11.8 (L) 12.0 - 15.0 g/dL   HCT 37.2 36.0 - 46.0 %   MCV 86.3 78.0 - 100.0 fL   MCH 27.4 26.0 - 34.0 pg   MCHC 31.7 30.0 - 36.0 g/dL   RDW 21.0 (H) 11.5 - 15.5 %   Platelets 311 150 - 400 K/uL   Neutrophils Relative % 65 %   Neutro Abs 4.7 1.7 - 7.7 K/uL   Lymphocytes Relative 31 %   Lymphs Abs 2.3 0.7 - 4.0 K/uL   Monocytes Relative 2 %   Monocytes Absolute 0.2 0.1 - 1.0 K/uL   Eosinophils Relative 2 %   Eosinophils Absolute 0.1 0.0 - 0.7 K/uL   Basophils Relative 0 %   Basophils Absolute 0.0 0.0 - 0.1 K/uL    --/--/A POS (07/10 0110)  IMAGING No results found.  MAU  Management/MDM: Ordered labs and reviewed results.  No evidence of systemic infection with no fever or WBC.  Likely developing local infection at incision site. Consult Dr Julien Girt with assessment and findings.  Treat with Keflex 500 mg BID x 5 days, pt to follow up in office on Thursday.  Renew Rx for Percocet x 10 tabs.  Return to MAU with worsening infection.  Pt stable at time of discharge.  ASSESSMENT 1. Postoperative infection, initial encounter   2. Cellulitis of umbilicus  PLAN Discharge home with infection precautions  Allergies as of 03/13/2017   No Known Allergies     Medication List    TAKE these medications   acetaminophen 500 MG tablet Commonly known as:  TYLENOL Take 500 mg by mouth every 6 (six) hours as needed for mild pain, moderate pain, fever or headache.   cephALEXin 500 MG capsule Commonly known as:  KEFLEX Take 1 capsule (500 mg total) by mouth 2 (two) times daily.   levothyroxine 200 MCG tablet Commonly known as:  SYNTHROID, LEVOTHROID Take 400 mcg by mouth daily before breakfast. Pt takes with a 43mcg tablet.   levothyroxine 25 MCG tablet Commonly known as:  SYNTHROID, LEVOTHROID Take 25 mcg by mouth daily before breakfast. Pt takes with two 228mcg tablets.   omeprazole 40 MG capsule Commonly known as:  PRILOSEC Take 40 mg by mouth daily as needed (for acid reflux).   oxyCODONE-acetaminophen 5-325 MG tablet Commonly known as:  PERCOCET/ROXICET Take 1-2 tablets by mouth every 6 (six) hours as needed.   prenatal multivitamin Tabs tablet Take 1 tablet by mouth at bedtime.   sertraline 100 MG tablet Commonly known as:  ZOLOFT Take 100 mg by mouth at bedtime.      Follow-up Information    Dian Queen, MD. Schedule an appointment as soon as possible for a visit.   Specialty:  Obstetrics and Gynecology Why:  On Thursday, 03/15/17. Return to MAU as needed for emergencies. Contact information: [place]  [id number] [phone]           Fatima Blank Certified Nurse-Midwife 03/13/2017  7:52 PM

## 2017-03-13 NOTE — Progress Notes (Signed)
Replaced patients dressing with sterile gauze,  Cleaned with sterile saline.

## 2017-03-13 NOTE — MAU Note (Signed)
Had tubal (attempted) 7/10, unable to do due to scar tissue. Is leaking yellow/clear blood fluid from incision.

## 2017-04-18 DIAGNOSIS — Z1389 Encounter for screening for other disorder: Secondary | ICD-10-CM | POA: Diagnosis not present

## 2017-04-27 DIAGNOSIS — F4010 Social phobia, unspecified: Secondary | ICD-10-CM | POA: Diagnosis not present

## 2017-05-07 DIAGNOSIS — F329 Major depressive disorder, single episode, unspecified: Secondary | ICD-10-CM | POA: Diagnosis not present

## 2017-05-07 DIAGNOSIS — Z1389 Encounter for screening for other disorder: Secondary | ICD-10-CM | POA: Diagnosis not present

## 2017-05-07 DIAGNOSIS — E669 Obesity, unspecified: Secondary | ICD-10-CM | POA: Diagnosis not present

## 2017-05-07 DIAGNOSIS — K432 Incisional hernia without obstruction or gangrene: Secondary | ICD-10-CM | POA: Diagnosis not present

## 2017-05-07 DIAGNOSIS — Z6841 Body Mass Index (BMI) 40.0 and over, adult: Secondary | ICD-10-CM | POA: Diagnosis not present

## 2017-05-07 DIAGNOSIS — F419 Anxiety disorder, unspecified: Secondary | ICD-10-CM | POA: Diagnosis not present

## 2017-06-11 DIAGNOSIS — F4010 Social phobia, unspecified: Secondary | ICD-10-CM | POA: Diagnosis not present

## 2017-07-09 DIAGNOSIS — F4010 Social phobia, unspecified: Secondary | ICD-10-CM | POA: Diagnosis not present

## 2017-07-17 DIAGNOSIS — F4010 Social phobia, unspecified: Secondary | ICD-10-CM | POA: Diagnosis not present

## 2017-08-03 DIAGNOSIS — F4010 Social phobia, unspecified: Secondary | ICD-10-CM | POA: Diagnosis not present

## 2018-08-14 ENCOUNTER — Other Ambulatory Visit: Payer: Self-pay | Admitting: Psychiatry

## 2018-08-14 NOTE — Telephone Encounter (Signed)
Need to review paper chart not seen in epic yet  Has office visit 12/20

## 2018-08-15 ENCOUNTER — Other Ambulatory Visit: Payer: Self-pay | Admitting: Psychiatry

## 2018-08-16 ENCOUNTER — Ambulatory Visit: Payer: BLUE CROSS/BLUE SHIELD | Admitting: Psychiatry

## 2018-08-16 ENCOUNTER — Encounter: Payer: Self-pay | Admitting: Psychiatry

## 2018-08-16 VITALS — BP 103/73 | HR 74

## 2018-08-16 DIAGNOSIS — F4310 Post-traumatic stress disorder, unspecified: Secondary | ICD-10-CM

## 2018-08-16 DIAGNOSIS — F4010 Social phobia, unspecified: Secondary | ICD-10-CM | POA: Diagnosis not present

## 2018-08-16 DIAGNOSIS — F330 Major depressive disorder, recurrent, mild: Secondary | ICD-10-CM | POA: Diagnosis not present

## 2018-08-16 MED ORDER — SERTRALINE HCL 100 MG PO TABS: 200.0000 mg | ORAL_TABLET | Freq: Every day | ORAL | 1 refills | Status: DC

## 2018-08-16 MED ORDER — ALPRAZOLAM 1 MG PO TABS: 1.0000 mg | ORAL_TABLET | Freq: Every day | ORAL | 2 refills | Status: DC | PRN

## 2018-08-16 MED ORDER — BUPROPION HCL ER (XL) 150 MG PO TB24: 150.0000 mg | ORAL_TABLET | Freq: Every day | ORAL | 2 refills | Status: DC

## 2018-08-16 NOTE — Telephone Encounter (Signed)
Please note

## 2018-08-16 NOTE — Progress Notes (Signed)
[name] [id number] 1976/06/30 42 y.o.  Subjective:   Patient ID:  [name] is a 43 y.o. (DOB [birth year]) female.  Chief Complaint:  Chief Complaint  Patient presents with  . Follow-up    h/o Anxiety, Depression    HPI [name] presents to the office today for follow-up of depression and anxiety. She reports that Sertraline 200 mg po qd seems to be most effective for her mood and anxiety. She reports some recent depression and anxiety with some acute stressors but otherwise has been well controlled. She reports occasionally taking Xanax, and most recently has been taking up to 2 mg total a day. Reports prior to stressful events she was taking 1 tab q 1-3 days. Reports having some panic attacks with acute stressors involving some family members. Reports that teenage daughter has been having some mental health issues. She reports that she had difficulty sleeping when acute stress occurred. Sleep is gradually improving and melatonin is helpful. Reports that she has lost 15 lbs since stressor occurred and appetite has been low. Motivation is low and had been ok. Reports energy has been chronically low and attributes this to thyroid abnormality. Has had difficulty with concentration after stressor and is frequently thinking about how to help others and problem-solve. Concentration has been adequate. Denies SI.   Reports without Sertraline she has racing, anxious thoughts.   Past Medication Trials: Buspar- HA's Wellbutrin XL- helped with weight loss in the past Sertraline- Effective  Review of Systems:  Review of Systems  Gastrointestinal: Positive for nausea.  Musculoskeletal: Negative for gait problem.  Neurological: Negative for tremors.  Psychiatric/Behavioral:       Please refer to HPI    Medications: I have reviewed the patient's current medications.  Current Outpatient Medications  Medication Sig Dispense Refill  . acetaminophen (TYLENOL) 500 MG  tablet Take 500 mg by mouth every 6 (six) hours as needed for mild pain, moderate pain, fever or headache.     . ALPRAZolam (XANAX) 1 MG tablet Take 1 tablet (1 mg total) by mouth daily as needed for anxiety. 30 tablet 2  . levothyroxine (SYNTHROID, LEVOTHROID) 137 MCG tablet Take 137 mcg by mouth 2 (two) times daily.     Marland Kitchen omeprazole (PRILOSEC) 40 MG capsule Take 40 mg by mouth daily as needed (for acid reflux).   3  . sertraline (ZOLOFT) 100 MG tablet Take 2 tablets (200 mg total) by mouth at bedtime. 180 tablet 1  . buPROPion (WELLBUTRIN XL) 150 MG 24 hr tablet Take 1 tablet (150 mg total) by mouth daily. 30 tablet 2   No current facility-administered medications for this visit.     Medication Side Effects: None and Other: GI s/s immediately after taking Sertraline  Allergies: No Known Allergies  Past Medical History:  Diagnosis Date  . AMA (advanced maternal age) multigravida 74+   . Anemia    with iron infusions  . Anxiety    panic attacks  . Cancer [facility]) 2010   thyroidectomy  . Dysrhythmia    no meds currently- palpitations assoc with panic disorder or thyroid disease  . GERD (gastroesophageal reflux disease)   . Headache(784.0)   . Hypothyroidism   . PCOS (polycystic ovarian syndrome)   . PONV (postoperative nausea and vomiting)   . Shortness of breath    on exertion- due to weight  . Thyroid disease     Family History  Problem Relation Age of Onset  . Heart attack Father  Age 19  . Hypertension Father   . Heart disease Father   . Diabetes Paternal Grandmother   . Heart disease Paternal Grandmother   . Breast cancer Paternal Grandmother   . Heart disease Paternal Grandfather   . Stroke Paternal Grandfather   . Bipolar disorder Maternal Grandmother   . Anxiety disorder Daughter   . Anesthesia problems Neg Hx   . Hypotension Neg Hx   . Malignant hyperthermia Neg Hx   . Pseudochol deficiency Neg Hx     Social History   Socioeconomic History  .  Marital status: Married    Spouse name: Not on file  . Number of children: Not on file  . Years of education: Not on file  . Highest education level: Not on file  Occupational History  . Not on file  Social Needs  . Financial resource strain: Not on file  . Food insecurity:    Worry: Not on file    Inability: Not on file  . Transportation needs:    Medical: Not on file    Non-medical: Not on file  Tobacco Use  . Smoking status: Former Smoker    Packs/day: 1.00    Years: 5.00    Pack years: 5.00    Types: Cigarettes    Last attempt to quit: 01/26/2005    Years since quitting: 13.5  . Smokeless tobacco: Never Used  Substance and Sexual Activity  . Alcohol use: Not Currently  . Drug use: No  . Sexual activity: Yes    Birth control/protection: None  Lifestyle  . Physical activity:    Days per week: Not on file    Minutes per session: Not on file  . Stress: Not on file  Relationships  . Social connections:    Talks on phone: Not on file    Gets together: Not on file    Attends religious service: Not on file    Active member of club or organization: Not on file    Attends meetings of clubs or organizations: Not on file    Relationship status: Not on file  . Intimate partner violence:    Fear of current or ex partner: Not on file    Emotionally abused: Not on file    Physically abused: Not on file    Forced sexual activity: Not on file  Other Topics Concern  . Not on file  Social History Narrative  . Not on file    Past Medical History, Surgical history, Social history, and Family history were reviewed and updated as appropriate.   Please see review of systems for further details on the patient's review from today.   Objective:   Physical Exam:  BP 103/73   Pulse 74   Physical Exam  Lab Review:     Component Value Date/Time   NA 134 (L) 01/16/2017 1150   K 3.5 01/16/2017 1150   CL 106 01/16/2017 1150   CO2 19 (L) 01/16/2017 1150   GLUCOSE 125 (H)  01/16/2017 1150   BUN 9 01/16/2017 1150   CREATININE 0.42 (L) 01/16/2017 1150   CALCIUM 8.5 (L) 01/16/2017 1150   PROT 6.3 (L) 01/16/2017 1150   ALBUMIN 2.5 (L) 01/16/2017 1150   AST 21 01/16/2017 1150   ALT 13 (L) 01/16/2017 1150   ALKPHOS 107 01/16/2017 1150   BILITOT 0.2 (L) 01/16/2017 1150   GFRNONAA >60 01/16/2017 1150   GFRAA >60 01/16/2017 1150       Component Value Date/Time  WBC 7.4 03/13/2017 1829   RBC 4.31 03/13/2017 1829   HGB 11.8 (L) 03/13/2017 1829   HCT 37.2 03/13/2017 1829   PLT 311 03/13/2017 1829   MCV 86.3 03/13/2017 1829   MCH 27.4 03/13/2017 1829   MCHC 31.7 03/13/2017 1829   RDW 21.0 (H) 03/13/2017 1829   LYMPHSABS 2.3 03/13/2017 1829   MONOABS 0.2 03/13/2017 1829   EOSABS 0.1 03/13/2017 1829   BASOSABS 0.0 03/13/2017 1829    No results found for: POCLITH, LITHIUM   No results found for: PHENYTOIN, PHENOBARB, VALPROATE, CBMZ   .res Assessment: Plan:   Will start Wellbutrin XL 150 mg daily to improve mood and residual low energy.  Patient reports that Wellbutrin XL was effective for weight loss in the past.  She reports that providers have spoken with her about starting phentermine or Belviq and patient feels most comfortable with trial of Wellbutrin XL for weight loss as well. Continue sertraline 200 mg daily for mood and anxiety. Continue Xanax 1 mg p.o. daily as needed anxiety   Social phobia - Chronic improved with sertraline 200 mg daily - Plan: sertraline (ZOLOFT) 100 MG tablet, ALPRAZolam (XANAX) 1 MG tablet  Posttraumatic stress disorder - Chronic.  Improved with sertraline 200 mg daily - Plan: sertraline (ZOLOFT) 100 MG tablet, ALPRAZolam (XANAX) 1 MG tablet  Mild episode of recurrent major depressive disorder (HCC) - Chronic with some improvement with sertraline 200 mg daily with continued low energy - Plan: buPROPion (WELLBUTRIN XL) 150 MG 24 hr tablet, sertraline (ZOLOFT) 100 MG tablet  Please see After Visit Summary for patient  specific instructions.  Future Appointments  Date Time Provider [place]  10/18/2018 12:45 PM Thayer Headings, PMHNP [facility]-CP None    No orders of the defined types were placed in this encounter.     -------------------------------

## 2018-10-18 ENCOUNTER — Ambulatory Visit: Payer: BLUE CROSS/BLUE SHIELD | Admitting: Psychiatry

## 2019-01-21 ENCOUNTER — Ambulatory Visit (INDEPENDENT_AMBULATORY_CARE_PROVIDER_SITE_OTHER): Payer: BLUE CROSS/BLUE SHIELD | Admitting: Psychiatry

## 2019-01-21 ENCOUNTER — Encounter: Payer: Self-pay | Admitting: Psychiatry

## 2019-01-21 ENCOUNTER — Other Ambulatory Visit: Payer: Self-pay

## 2019-01-21 DIAGNOSIS — F330 Major depressive disorder, recurrent, mild: Secondary | ICD-10-CM

## 2019-01-21 DIAGNOSIS — F4010 Social phobia, unspecified: Secondary | ICD-10-CM | POA: Diagnosis not present

## 2019-01-21 DIAGNOSIS — F4310 Post-traumatic stress disorder, unspecified: Secondary | ICD-10-CM

## 2019-01-21 MED ORDER — ALPRAZOLAM 1 MG PO TABS: ORAL_TABLET | ORAL | 2 refills | Status: DC

## 2019-01-21 MED ORDER — SERTRALINE HCL 20 MG/ML PO CONC: 200.0000 mg | Freq: Every day | ORAL | 1 refills | Status: DC

## 2019-01-21 MED ORDER — BUPROPION HCL ER (XL) 150 MG PO TB24: 150.0000 mg | ORAL_TABLET | Freq: Every day | ORAL | 2 refills | Status: DC

## 2019-01-21 MED ORDER — PROPRANOLOL HCL 10 MG PO TABS: ORAL_TABLET | ORAL | 1 refills | Status: DC

## 2019-01-21 NOTE — Progress Notes (Signed)
[name] [id number] [birth year] 43 y.o.  Virtual Visit via Telephone Note  I connected with@ on 01/21/19 at  9:30 AM EDT by telephone and verified that I am speaking with the correct person using two identifiers.   I discussed the limitations, risks, security and privacy concerns of performing an evaluation and management service by telephone and the availability of in person appointments. I also discussed with the patient that there may be a patient responsible charge related to this service. The patient expressed understanding and agreed to proceed.   I discussed the assessment and treatment plan with the patient. The patient was provided an opportunity to ask questions and all were answered. The patient agreed with the plan and demonstrated an understanding of the instructions.   The patient was advised to call back or seek an in-person evaluation if the symptoms worsen or if the condition fails to improve as anticipated.  I provided 30 minutes of non-face-to-face time during this encounter.  The patient was located at home.  The provider was located at home.   Thayer Headings, PMHNP   Subjective:   Patient ID:  [name] is a 43 y.o. (DOB 21-Jan-1976) female.  Chief Complaint:  Chief Complaint  Patient presents with  . Anxiety  . Depression    HPI [name] presents for follow-up of depression and anxiety. She reports increased stress, particularly with the pandemic. Reports that they were concerned about possibly losing their jobs. She reports that anxiety is "worse." She reports that she experiences an "uneasy feeling." Reports panic attacks with riding in a car and will also have panic attacks without a specific trigger. Reports having obsessive thoughts about trying to avoid germs and contamination and is having some compulsions re: cleaning and hand washing. Reports that she is "constantly thinking... over-thinking." Reports that she is unable to  relax because she is constantly thinking about what needs to be done. Reports some rumination. Reports HA's with anxiety. Reports difficulty falling asleep at times due to anxiety. Reports sleeping 6-7 hours a night and is no longer able to nap during the day. She feels that Wellbutrin XL "helps" with concentration, appetite, motivation, and energy. Reports that motivation could probably be better. Denies excessive energy or goal-directed activity. Denies periods of decreased need for sleep. Reports that she has intentionally trying to lose weight. She reports that her husband has questioned if she has "Bipolar" and will sometimes do the opposite of what she said she would. She reports that she is indecisive and will frequently change her mind. She reports some mood lability. Denies depressed mood. She reports that she is irritable at times. Denies SI.   Reports that she is now working from home.   Reports taking Xanax daily and some days needing to take it more.   Reports that Sertraline causes her abdominal pain about 15 minutes after taking it. 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   Past Medication Trials: Buspar- HA's Wellbutrin XL- helped with weight loss in the past Sertraline- Effective   Review of Systems:  Review of Systems  Gastrointestinal:       Continues to have chronic IBS.   Musculoskeletal: Negative for gait problem.  Neurological: Positive for headaches. Negative for tremors.  Psychiatric/Behavioral:       Please refer to HPI    Medications: I have reviewed the patient's current medications.  Current Outpatient Medications  Medication Sig Dispense Refill  . acetaminophen (TYLENOL) 500 MG tablet Take 500 mg by mouth every 6 (six) hours as needed for mild pain, moderate pain, fever or headache.     . ALPRAZolam (XANAX) 1 MG tablet Take 1 tablet po BID prn anxiety 45 tablet 2  .  calcium citrate (CALCITRATE - DOSED IN MG ELEMENTAL CALCIUM) 950 MG tablet Take 200 mg of elemental calcium by mouth daily.    . Cholecalciferol (VITAMIN D3 PO) Take by mouth.    . levothyroxine (SYNTHROID, LEVOTHROID) 137 MCG tablet Take 137 mcg by mouth 2 (two) times daily.     . Multiple Vitamin (MULTIVITAMIN) tablet Take 1 tablet by mouth daily.    [place] Kitchen buPROPion (WELLBUTRIN XL) 150 MG 24 hr tablet Take 1 tablet (150 mg total) by mouth daily for 30 days. 30 tablet 2  . omeprazole (PRILOSEC) 40 MG capsule Take 40 mg by mouth daily as needed (for acid reflux).   3  . propranolol (INDERAL) 10 MG tablet Take 1-2 tabs po BID prn anxiety 120 tablet 1  . sertraline (ZOLOFT) 20 MG/ML concentrated solution Take 10 mLs (200 mg total) by mouth daily for 30 days. 300 mL 1   No current facility-administered medications for this visit.     Medication Side Effects: None  Allergies: No Known Allergies  Past Medical History:  Diagnosis Date  . AMA (advanced maternal age) multigravida 14+   . Anemia    with iron infusions  . Anxiety    panic attacks  . Cancer [facility]) 2010   thyroidectomy  . Dysrhythmia    no meds currently- palpitations assoc with panic disorder or thyroid disease  . GERD (gastroesophageal reflux disease)   . Headache(784.0)   . Hypothyroidism   . PCOS (polycystic ovarian syndrome)   . PONV (postoperative nausea and vomiting)   . Shortness of breath    on exertion- due to weight  . Thyroid disease     Family History  Problem Relation Age of Onset  . Heart attack Father        Age 36  . Hypertension Father   . Heart disease Father   . Diabetes Paternal Grandmother   . Heart disease Paternal Grandmother   . Breast cancer Paternal Grandmother   . Heart disease Paternal Grandfather   . Stroke Paternal Grandfather   . Bipolar disorder Maternal Grandmother   . Anxiety disorder Daughter   . Anesthesia problems Neg Hx   . Hypotension Neg Hx   . Malignant hyperthermia Neg Hx    . Pseudochol deficiency Neg Hx     Social History   Socioeconomic History  . Marital status: Married    Spouse name: Not on file  . Number of children: Not on file  . Years of education: Not on file  . Highest education level: Not on file  Occupational History  . Not on file  Social Needs  . Financial resource strain: Not on file  . Food insecurity:    Worry: Not on file    Inability: Not on file  . Transportation needs:    Medical: Not on file    Non-medical: Not on file  Tobacco Use  . Smoking status: Former Smoker    Packs/day: 1.00    Years: 5.00    Pack years: 5.00    Types: Cigarettes    Last attempt to quit: 01/26/2005    Years since quitting: 13.9  . Smokeless tobacco: Never Used  Substance and  Sexual Activity  . Alcohol use: Not Currently  . Drug use: No  . Sexual activity: Yes    Birth control/protection: None  Lifestyle  . Physical activity:    Days per week: Not on file    Minutes per session: Not on file  . Stress: Not on file  Relationships  . Social connections:    Talks on phone: Not on file    Gets together: Not on file    Attends religious service: Not on file    Active member of club or organization: Not on file    Attends meetings of clubs or organizations: Not on file    Relationship status: Not on file  . Intimate partner violence:    Fear of current or ex partner: Not on file    Emotionally abused: Not on file    Physically abused: Not on file    Forced sexual activity: Not on file  Other Topics Concern  . Not on file  Social History Narrative  . Not on file    Past Medical History, Surgical history, Social history, and Family history were reviewed and updated as appropriate.   Please see review of systems for further details on the patient's review from today.   Objective:   Physical Exam:  There were no vitals taken for this visit.  Physical Exam Neurological:     Mental Status: She is alert and oriented to person, place,  and time.     Cranial Nerves: No dysarthria.  Psychiatric:        Attention and Perception: Attention normal.        Mood and Affect: Mood is anxious.        Speech: Speech normal.        Behavior: Behavior is cooperative.        Thought Content: Thought content normal. Thought content is not paranoid or delusional. Thought content does not include homicidal or suicidal ideation. Thought content does not include homicidal or suicidal plan.        Cognition and Memory: Cognition and memory normal.        Judgment: Judgment normal.     Lab Review:     Component Value Date/Time   NA 134 (L) 01/16/2017 1150   K 3.5 01/16/2017 1150   CL 106 01/16/2017 1150   CO2 19 (L) 01/16/2017 1150   GLUCOSE 125 (H) 01/16/2017 1150   BUN 9 01/16/2017 1150   CREATININE 0.42 (L) 01/16/2017 1150   CALCIUM 8.5 (L) 01/16/2017 1150   PROT 6.3 (L) 01/16/2017 1150   ALBUMIN 2.5 (L) 01/16/2017 1150   AST 21 01/16/2017 1150   ALT 13 (L) 01/16/2017 1150   ALKPHOS 107 01/16/2017 1150   BILITOT 0.2 (L) 01/16/2017 1150   GFRNONAA >60 01/16/2017 1150   GFRAA >60 01/16/2017 1150       Component Value Date/Time   WBC 7.4 03/13/2017 1829   RBC 4.31 03/13/2017 1829   HGB 11.8 (L) 03/13/2017 1829   HCT 37.2 03/13/2017 1829   PLT 311 03/13/2017 1829   MCV 86.3 03/13/2017 1829   MCH 27.4 03/13/2017 1829   MCHC 31.7 03/13/2017 1829   RDW 21.0 (H) 03/13/2017 1829   LYMPHSABS 2.3 03/13/2017 1829   MONOABS 0.2 03/13/2017 1829   EOSABS 0.1 03/13/2017 1829   BASOSABS 0.0 03/13/2017 1829    No results found for: POCLITH, LITHIUM   No results found for: PHENYTOIN, PHENOBARB, VALPROATE, CBMZ   .res Assessment: Plan:  Will change sertraline from tablet to oral solution since patient reports that she has absorption issues due to gastric bypass surgery and all of her other medications are either capsules or in chewable form.  Also patient reports that she has abdominal discomfort 15 minutes after taking  sertraline tablets. Will therefore switch to sertraline oral solution 200 mg daily for anxiety and depression. Discussed potential benefits, risks, and side effects of propanolol to be used as needed for anxiety.  Discussed that she could take propanolol 20 to 30 minutes before triggering event, such as riding in a car or social events, to help prevent acute anxiety.  Discussed stopping propanolol if she feels dizzy or lightheaded. Continue Xanax 1 mg as needed for anxiety.  Will change quantity from #30 to #45 month due to patient occasionally needing to take Xanax twice in 1 day. Will continue Wellbutrin XL 150 mg daily since patient reports that this is been helpful for her mood, energy, appetite, and concentration. Patient to follow-up in 4 weeks or sooner if clinically indicated. Patient advised to contact office with any questions, adverse effects, or acute worsening in signs and symptoms.  Mild episode of recurrent major depressive disorder ([place]) - Plan: sertraline (ZOLOFT) 20 MG/ML concentrated solution, buPROPion (WELLBUTRIN XL) 150 MG 24 hr tablet  Social phobia - Plan: ALPRAZolam (XANAX) 1 MG tablet, propranolol (INDERAL) 10 MG tablet  Posttraumatic stress disorder - Plan: ALPRAZolam (XANAX) 1 MG tablet, propranolol (INDERAL) 10 MG tablet  Please see After Visit Summary for patient specific instructions.  No future appointments.  No orders of the defined types were placed in this encounter.     -------------------------------

## 2019-01-24 ENCOUNTER — Other Ambulatory Visit: Payer: Self-pay | Admitting: Psychiatry

## 2019-01-24 NOTE — Telephone Encounter (Signed)
Last rx I see is for liquid?

## 2019-01-27 ENCOUNTER — Emergency Department (HOSPITAL_COMMUNITY): Payer: BLUE CROSS/BLUE SHIELD

## 2019-01-27 ENCOUNTER — Other Ambulatory Visit: Payer: Self-pay

## 2019-01-27 ENCOUNTER — Encounter (HOSPITAL_COMMUNITY): Payer: Self-pay | Admitting: *Deleted

## 2019-01-27 ENCOUNTER — Other Ambulatory Visit: Payer: Self-pay | Admitting: Student

## 2019-01-27 ENCOUNTER — Emergency Department (HOSPITAL_COMMUNITY)
Admission: EM | Admit: 2019-01-27 | Discharge: 2019-01-27 | Disposition: A | Discharge status: Home / Self Care | Payer: BLUE CROSS/BLUE SHIELD | Attending: Emergency Medicine | Admitting: Emergency Medicine

## 2019-01-27 DIAGNOSIS — R079 Chest pain, unspecified: Secondary | ICD-10-CM

## 2019-01-27 DIAGNOSIS — Z85850 Personal history of malignant neoplasm of thyroid: Secondary | ICD-10-CM | POA: Diagnosis not present

## 2019-01-27 DIAGNOSIS — Z79899 Other long term (current) drug therapy: Secondary | ICD-10-CM | POA: Insufficient documentation

## 2019-01-27 DIAGNOSIS — M79622 Pain in left upper arm: Secondary | ICD-10-CM | POA: Insufficient documentation

## 2019-01-27 DIAGNOSIS — I200 Unstable angina: Secondary | ICD-10-CM

## 2019-01-27 DIAGNOSIS — R61 Generalized hyperhidrosis: Secondary | ICD-10-CM | POA: Diagnosis not present

## 2019-01-27 DIAGNOSIS — R110 Nausea: Secondary | ICD-10-CM | POA: Diagnosis not present

## 2019-01-27 DIAGNOSIS — R0789 Other chest pain: Secondary | ICD-10-CM | POA: Diagnosis not present

## 2019-01-27 DIAGNOSIS — Z87891 Personal history of nicotine dependence: Secondary | ICD-10-CM | POA: Insufficient documentation

## 2019-01-27 DIAGNOSIS — R0602 Shortness of breath: Secondary | ICD-10-CM | POA: Diagnosis not present

## 2019-01-27 LAB — CBC
HCT: 37.5 % (ref 36.0–46.0)
Hemoglobin: 11.4 g/dL — ABNORMAL LOW (ref 12.0–15.0)
MCH: 23.8 pg — ABNORMAL LOW (ref 26.0–34.0)
MCHC: 30.4 g/dL (ref 30.0–36.0)
MCV: 78.3 fL — ABNORMAL LOW (ref 80.0–100.0)
Platelets: 390 10*3/uL (ref 150–400)
RBC: 4.79 MIL/uL (ref 3.87–5.11)
RDW: 15.9 % — ABNORMAL HIGH (ref 11.5–15.5)
WBC: 7.6 10*3/uL (ref 4.0–10.5)
nRBC: 0 % (ref 0.0–0.2)

## 2019-01-27 LAB — BASIC METABOLIC PANEL
Anion gap: 13 (ref 5–15)
BUN: 16 mg/dL (ref 6–20)
CO2: 21 mmol/L — ABNORMAL LOW (ref 22–32)
Calcium: 8.9 mg/dL (ref 8.9–10.3)
Chloride: 102 mmol/L (ref 98–111)
Creatinine, Ser: 0.71 mg/dL (ref 0.44–1.00)
GFR calc Af Amer: >60 mL/min (ref >60)
GFR calc non Af Amer: >60 mL/min (ref >60)
Glucose, Bld: 101 mg/dL — ABNORMAL HIGH (ref 70–99)
Potassium: 4.1 mmol/L (ref 3.5–5.1)
Sodium: 136 mmol/L (ref 135–145)

## 2019-01-27 LAB — I-STAT BETA HCG BLOOD, ED (MC, WL, AP ONLY): I-stat hCG, quantitative: <5 m[IU]/mL (ref <5)

## 2019-01-27 LAB — TROPONIN I
Troponin I: <0.03 ng/mL (ref <0.03)
Troponin I: <0.03 ng/mL (ref <0.03)

## 2019-01-27 MED ORDER — NITROGLYCERIN 0.4 MG SL SUBL: 0.4000 mg | SUBLINGUAL_TABLET | SUBLINGUAL | 0 refills | Status: DC | PRN

## 2019-01-27 MED ORDER — SODIUM CHLORIDE 0.9% FLUSH: 3.0000 mL | Freq: Once | INTRAVENOUS | Status: DC

## 2019-01-27 NOTE — Discharge Instructions (Addendum)
You have been seen today for chest pain. Please read and follow all provided instructions.   1. Medications: nitroglycerin as needed for chest pain, usual home medications 2. Treatment: rest, drink plenty of fluids 3. Follow Up: Please call and schedule an appointment with cardiology. The referral information has been provided. Please follow up with your primary doctor in 2 days for discussion of your diagnoses and further evaluation after today's visit; if you do not have a primary care doctor use the resource guide provided to find one; Please return to the ER for any new or worsening symptoms. Please obtain all of your results from medical records or have your doctors office obtain the results - share them with your doctor - you should be seen at your doctors office. Call today to arrange your follow up.   You should return to the ER if you develop severe or worsening symptoms.   Emergency Department Resource Guide 1) Find a Doctor and Pay Out of Pocket Although you won't have to find out who is covered by your insurance plan, it is a good idea to ask around and get recommendations. You will then need to call the office and see if the doctor you have chosen will accept you as a new patient and what types of options they offer for patients who are self-pay. Some doctors offer discounts or will set up payment plans for their patients who do not have insurance, but you will need to ask so you aren't surprised when you get to your appointment.  2) Contact Your Local Health Department Not all health departments have doctors that can see patients for sick visits, but many do, so it is worth a call to see if yours does. If you don't know where your local health department is, you can check in your phone book. The CDC also has a tool to help you locate your state's health department, and many state websites also have listings of all of their local health departments.  3) Find a [facility] If your  illness is not likely to be very severe or complicated, you may want to try a walk in clinic. These are popping up all over the country in pharmacies, drugstores, and shopping centers. They're usually staffed by nurse practitioners or physician assistants that have been trained to treat common illnesses and complaints. They're usually fairly quick and inexpensive. However, if you have serious medical issues or chronic medical problems, these are probably not your best option.  No Primary Care Doctor: Call Health Connect at  [phone] - they can help you locate a primary care doctor that  accepts your insurance, provides certain services, etc. Physician Referral Service- [phone]  Chronic Pain Problems: Organization         Address  Phone   Notes  [facility]  [phone] Patients need to be referred by their primary care doctor.   Medication Assistance: Organization         Address  Phone   Notes  [facility] Medication [facility] [address] [place], [place] [address] [phone] --Must be a resident of [facility] -- Must have NO insurance coverage whatsoever (no Medicaid/ Medicare, etc.) -- The pt. MUST have a primary care doctor that directs their care regularly and follows them in the community   [facility]  [phone]   [employer]  [phone]    Agencies that provide inexpensive medical care: Organization  Address  Phone   Notes  [place]  [phone]   [facility]    [phone]   [facility] [place], [place] [address] [phone]   [place] [address] [place], [place] [phone]   [facility]    [phone]   [facility]    [phone]   [place] [facility] [address], [place] Phone:  [phone], Fax:  [phone] Hours of Operation:  9 am  - 6 pm, M-F.  Also accepts Medicaid/Medicare and self-pay.  [facility] for [place], [address], [place] Phone: [phone], Fax: [phone]. Hours of Operation:  8:30 am - 5:30 pm, M-F.  Also accepts Medicaid and self-pay.  [facility] [address] [place] Phone: [phone]   [place], [place], [place] [phone], Ext. [phone] Mondays & Thursdays: 7-9 AM.  First 15 patients are seen on a first come, first serve basis.    [place] Providers:  Organization         Address  Phone   Notes  [facility] [address], [place] [phone] Also accepts self-pay patients.  [facility] [address], [place]  [phone]   [place], [address], [place] [phone]   [facility] [address] [place] [phone]   Lucianne Lei [address], [place]   [phone] Only accepts [place] patients after they have their name applied to their card.   Self-Pay (no insurance) in [facility]:  Organization         Address  Phone   Notes  Sickle Cell Patients, [facility] [place] [phone]   [facility] [place] [phone]   [facility] [place]  [place], [place], [place] [phone]   [facility]/Dr. Osei-Bonsu  [address] [place] or [address], [place] [phone] Phone number for both [place] and [place] locations is the same.  [facility] and [facility] [address], [place] [phone]   [facility] [address] [place] or [address] [phone]   [facility] [address] [place] [phone], phone; [phone], fax Sees patients 1st and 3rd Saturday of every month.  Must not qualify for public or private insurance (i.e. Medicaid, Medicare, [place] [facility] Choice, Veterans' Benefits)  Household income should be no more than 200% of the poverty level The clinic cannot treat you if you are pregnant or think you are pregnant  Sexually transmitted diseases are not treated at the clinic.

## 2019-01-27 NOTE — ED Triage Notes (Signed)
Pt in c/o chest pain that started this morning around 8am, reports nausea and weakness, tightness when taking a deep breath, pain radiated down her left arm, no distress noted

## 2019-01-27 NOTE — Telephone Encounter (Signed)
Left message for her to call me back. 

## 2019-01-27 NOTE — ED Provider Notes (Signed)
[facility] EMERGENCY DEPARTMENT Provider Note   CSN: [id number] Arrival date & time: 01/27/19  0940    History   Chief Complaint Chief Complaint  Patient presents with  . Chest Pain    HPI [name] is a 43 y.o. female with a PMH of anemia, anxiety, thyroid cancer s/p thyroidectomy, and PCOS presenting with intermittent central chest pain radiating down left arm at 8:30am this morning. Patient describes pain as tightness. Patient states nothing made symptoms better or worse.  Patient reports she was sitting on her computer working while her symptoms began. Patient reports associated diaphoresis, nausea, and shortness of breath. Patient states symptoms lasted 5 minutes and resolved on their own. Patient denies any symptoms currently. Patient denies using OCPs, recent travel, recent surgery, leg edema/pain, or hx of DVT/PE. Patient reports father had an MI at age 53 and died at age 77. Patient denies any personal history of cardiac problems. Patient denies tobacco, alcohol, or drug use. Patient denies fever, chills, vomiting, abdominal pain, diarrhea, cough, congestion, sick exposures, or recent travel.       HPI  Past Medical History:  Diagnosis Date  . Anemia    with iron infusions  . Anxiety    panic attacks  . Cancer [facility]) 2010   thyroidectomy  . Dysrhythmia    no meds currently- palpitations assoc with panic disorder or thyroid disease  . GERD (gastroesophageal reflux disease)   . Hypothyroidism   . PCOS (polycystic ovarian syndrome)   . PONV (postoperative nausea and vomiting)   . Thyroid disease     Patient Active Problem List   Diagnosis Date Noted  . Pregnancy 03/06/2017  . NSVD (normal spontaneous vaginal delivery) 03/06/2017  . Active labor at term 04/21/2015  . Gastric bypass status for obesity 03/18/2014  . Chest pain 09/18/2013  . Obesity, morbid, BMI 50 or higher ([place]) 09/18/2013  . CONSTIPATION 12/10/2009    Past Surgical  History:  Procedure Laterality Date  . bilateral arm lift surgery  2005  . CHOLECYSTECTOMY  11/01/2011   Procedure: LAPAROSCOPIC CHOLECYSTECTOMY;  Surgeon: Donato Heinz, MD;  Location: [facility];  Service: General;  Laterality: N/A;  . colonscopy    . DIAGNOSTIC LAPAROSCOPY  1994   x 2  . DILATION AND CURETTAGE OF UTERUS  10/12/2011   Procedure: DILATATION AND CURETTAGE;  Surgeon: Cyril Mourning, MD;  Location: [place] [facility];  Service: Gynecology;  Laterality: N/A;  . DILATION AND CURETTAGE OF UTERUS N/A 08/19/2013   Procedure: DILATATION AND CURETTAGE;  Surgeon: Cyril Mourning, MD;  Location: [place] [facility];  Service: Gynecology;  Laterality: N/A;  . 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 GASTRIC BYPASS    . OVARIAN CYST REMOVAL  '95  . removal of IUD    . right tube removed & ovarian cyst '11    . THYROIDECTOMY    . TONSILLECTOMY AND ADENOIDECTOMY    . TUBAL LIGATION N/A 03/06/2017   Procedure: POST PARTUM TUBAL LIGATION;  Surgeon: Dian Queen, MD;  Location: [facility];  Service: Gynecology;  Laterality: N/A;     OB History    Gravida  7   Para  5   Term  5   Preterm      AB  2   Living  5     SAB  1   TAB  1   Ectopic  Multiple  0   Live Births  5            Home Medications    Prior to Admission medications   Medication Sig Start Date End Date Taking? Authorizing Provider  acetaminophen (TYLENOL) 500 MG tablet Take 500 mg by mouth every 6 (six) hours as needed for mild pain, moderate pain, fever or headache.     [provider]  ALPRAZolam Duanne Moron) 1 MG tablet Take 1 tablet po BID prn anxiety 01/21/19   Thayer Headings, PMHNP  buPROPion (WELLBUTRIN XL) 150 MG 24 hr tablet Take 1 tablet (150 mg total) by mouth daily for 30 days. 01/21/19 02/20/19  Thayer Headings, PMHNP  calcium citrate (CALCITRATE - DOSED IN MG ELEMENTAL CALCIUM) 950 MG tablet Take 200 mg of elemental calcium by mouth daily.    [provider]  Cholecalciferol (VITAMIN D3 PO) Take by mouth.    [provider]  levothyroxine (SYNTHROID, LEVOTHROID) 137 MCG tablet Take 137 mcg by mouth 2 (two) times daily.     [provider]  Multiple Vitamin (MULTIVITAMIN) tablet Take 1 tablet by mouth daily.    [provider]  nitroGLYCERIN (NITROSTAT) 0.4 MG SL tablet Place 1 tablet (0.4 mg total) under the tongue every 5 (five) minutes as needed for chest pain. 01/27/19   Darlin Drop P, PA-C  omeprazole (PRILOSEC) 40 MG capsule Take 40 mg by mouth daily as needed (for acid reflux).     [provider]  propranolol (INDERAL) 10 MG tablet Take 1-2 tabs po BID prn anxiety 01/21/19   Thayer Headings, PMHNP  sertraline (ZOLOFT) 20 MG/ML concentrated solution Take 10 mLs (200 mg total) by mouth daily for 30 days. 01/21/19 02/20/19  Thayer Headings, PMHNP    Family History Family History  Problem Relation Age of Onset  . Heart attack Father 43       Died age 28  . Hypertension Father   . Heart disease Father   . Diabetes Paternal Grandmother   . Heart disease Paternal Grandmother   . Breast cancer Paternal Grandmother   . Heart disease Paternal Grandfather   . Stroke Paternal Grandfather   . Bipolar disorder Maternal Grandmother   . Anxiety disorder Daughter   . Anesthesia problems Neg Hx   . Hypotension Neg Hx   . Malignant hyperthermia Neg Hx   . Pseudochol deficiency Neg Hx     Social History Social History   Tobacco Use  . Smoking status: Former Smoker    Packs/day: 1.00    Years: 5.00    Pack years: 5.00    Types: Cigarettes    Last attempt to quit: 01/26/2005    Years since quitting: 14.0  . Smokeless tobacco: Never Used  Substance Use Topics  . Alcohol use: Not Currently  . Drug use: No     Allergies   Patient has no known allergies.   Review of Systems Review of Systems  Constitutional: Positive for diaphoresis. Negative for activity change, appetite change, chills,  fatigue, fever and unexpected weight change.  HENT: Negative for congestion and rhinorrhea.   Respiratory: Positive for shortness of breath. Negative for cough, chest tightness and wheezing.   Cardiovascular: Positive for chest pain. Negative for palpitations and leg swelling.  Gastrointestinal: Positive for nausea. Negative for abdominal pain, constipation, diarrhea and vomiting.  Endocrine: Negative for cold intolerance and heat intolerance.  Musculoskeletal: Negative for back pain.  Skin: Negative for rash.  Allergic/Immunologic: Negative for immunocompromised state.  Neurological: Negative for dizziness, syncope, weakness and light-headedness.  Psychiatric/Behavioral: Negative for agitation and behavioral problems. The patient is not nervous/anxious.      Physical Exam Updated Vital Signs BP 115/80   Pulse 73   Temp 97.9 F (36.6 C) (Oral)   Resp 14   Ht 5\' 4"  (1.626 m)   Wt 117 kg   SpO2 100%   BMI 44.29 kg/m   Physical Exam Vitals signs and nursing note reviewed.  Constitutional:      General: She is not in acute distress.    Appearance: She is well-developed. She is not diaphoretic.  HENT:     Head: Normocephalic and atraumatic.  Neck:     Musculoskeletal: Normal range of motion and neck supple.     Vascular: No JVD.  Cardiovascular:     Rate and Rhythm: Normal rate and regular rhythm.     Pulses: Normal pulses.          Radial pulses are 2+ on the right side and 2+ on the left side.       Dorsalis pedis pulses are 2+ on the right side and 2+ on the left side.     Heart sounds: Normal heart sounds. No murmur. No friction rub. No gallop.   Pulmonary:     Effort: Pulmonary effort is normal. No respiratory distress.     Breath sounds: Normal breath sounds. No wheezing, rhonchi or rales.  Chest:     Chest wall: No tenderness.  Abdominal:     Palpations: Abdomen is soft.     Tenderness: There is no abdominal tenderness.  Musculoskeletal: Normal range of motion.      Right lower leg: She exhibits no tenderness. No edema.     Left lower leg: She exhibits no tenderness. No edema.  Skin:    Capillary Refill: Capillary refill takes less than 2 seconds.     Coloration: Skin is not pale.     Findings: No rash.  Neurological:     Mental Status: She is alert.    ED Treatments / Results  Labs (all labs ordered are listed, but only abnormal results are displayed) Labs Reviewed  BASIC METABOLIC PANEL - Abnormal; Notable for the following components:      Result Value   CO2 21 (*)    Glucose, Bld 101 (*)    All other components within normal limits  CBC - Abnormal; Notable for the following components:   Hemoglobin 11.4 (*)    MCV 78.3 (*)    MCH 23.8 (*)    RDW 15.9 (*)    All other components within normal limits  TROPONIN I  TROPONIN I  I-STAT BETA HCG BLOOD, ED ([facility], [facility], [facility] ONLY)    EKG EKG Interpretation  Date/Time:  Monday January 27 2019 09:49:11 EDT Ventricular Rate:  94 PR Interval:  162 QRS Duration: 80 QT Interval:  380 QTC Calculation: 475 R Axis:   33 Text Interpretation:  Normal sinus rhythm Normal ECG No acute changes No significant change since last tracing Reconfirmed by Varney Biles [phone]) on 01/27/2019 12:44:01 PM   Radiology Dg Chest 2 View  Result Date: 01/27/2019 CLINICAL DATA:  Chest pain, shortness of breath EXAM: CHEST - 2 VIEW COMPARISON:  08/22/2011 FINDINGS: The heart size and mediastinal contours are within normal limits. Both lungs are clear. The visualized skeletal structures are unremarkable. IMPRESSION: No acute abnormality of the lungs.  No focal airspace opacity. Electronically Signed   By: Eddie Candle  M.D.   On: 01/27/2019 10:12    Procedures Procedures (including critical care time)  Medications Ordered in ED Medications  sodium chloride flush (NS) 0.9 % injection 3 mL (has no administration in time range)     Initial Impression / Assessment and Plan / ED Course  I have reviewed the triage vital  signs and the nursing notes.  Pertinent labs & imaging results that were available during my care of the patient were reviewed by me and considered in my medical decision making (see chart for details).  Clinical Course as of Jan 27 1511  Mon Jan 27, 2019  1216 No acute abnormality of the lungs.  No focal airspace opacity.  DG Chest 2 View [AH]  1241 Hemoglobin low at 11.4. This appears to be similar to previous values.   Hemoglobin(!): 11.4 [AH]  1241 Initial troponin is negative.   Troponin I - ONCE - STAT [AH]  1241 EKG without ST changes.  ED EKG [AH]    Clinical Course User Index [AH] Gerlene, [place], PA-C      Patient is to be discharged with recommendation to follow up with PCP and cardiology in regards to today's hospital visit. Chest pain is not likely of cardiac or pulmonary etiology d/t presentation, PERC negative, VSS, no tracheal deviation, no JVD or new murmur, RRR, breath sounds equal bilaterally, EKG without acute abnormalities, negative troponin x2, and negative CXR. Heart score is 3. Patient has been asymptomatic while in the ER. Patient has not had recurrence of symptoms since the initial episode. Pt has been advised to return to the ED if CP becomes exertional, associated with diaphoresis or nausea, radiates to left jaw/arm, worsens or becomes concerning in any way. Pt appears reliable for follow up and is agreeable to discharge.   Findings and plan of care discussed with supervising physician Dr. Kathrynn Humble. Dr. Kathrynn Humble spoke to Dr. Percival Spanish, Cardiologist, to discuss case and ensure close follow up. Cardiology has agreed to evaluate patient. Cardiology recommends sublingual nitroglycerin and outpatient follow up for a stress test. Discussed plan with patient and patient states she understands and agrees with plan.   Final Clinical Impressions(s) / ED Diagnoses   Final diagnoses:  Nonspecific chest pain    ED Discharge Orders         Ordered    nitroGLYCERIN  (NITROSTAT) 0.4 MG SL tablet  Every 5 min PRN     01/27/19 1511           Jules, [place], PA-C 01/27/19 Underwood, Ankit, MD 01/27/19 1656

## 2019-01-27 NOTE — Consult Note (Signed)
CARDIOLOGY CONSULT NOTE  Patient ID: [name] MRN: [id number] DOB/AGE: 03-14-1976 43 y.o.  Admit date: 01/27/2019 Primary Physician   Dyer, [place] Primary Cardiologist   No primary care provider on file.  New Chief Complaint    Chest pain  HPI:   The patient has no past cardiac history.  However, she has a significant family history with her father having had his first myocardial infarction at age 33 dying in his 53s of vascular disease.  His parents both had early onset vascular disease.  She did have a nuclear study about 5 years ago prior to having gastric bypass surgery.  This was negative for any evidence of ischemia.  Today she was starting work which she does by computer at home.  She developed chest discomfort.  This was in her mid lower sternum.  It radiated to her shoulder into her left arm.  She felt flushed.  She had not had this kind of discomfort before.  It was severe.  She felt lightheaded.  She felt flushed.  She felt nauseated.  She became diaphoretic.  Spontaneously resolved after about 5 minutes.  During that time she did not try to take anything to relieve it.  It is not made worse by position movement or breathing.  She is not particularly active that she does her chores of daily living.  He has 5 children living at home and grandchild.  With her chores daily living she does not get chest pressure, neck or arm discomfort.  She has not had any new shortness of breath, PND or orthopnea.  Said no palpitations, presyncope or syncope.  Said no weight gain or edema.  Past Medical History:  Diagnosis Date  . AMA (advanced maternal age) multigravida 66+   . Anemia    with iron infusions  . Anxiety    panic attacks  . Cancer [facility]) 2010   thyroidectomy  . Dysrhythmia    no meds currently- palpitations assoc with panic disorder or thyroid disease  . GERD (gastroesophageal reflux disease)   . Headache(784.0)   . Hypothyroidism   . PCOS  (polycystic ovarian syndrome)   . PONV (postoperative nausea and vomiting)   . Shortness of breath    on exertion- due to weight  . Thyroid disease     Past Surgical History:  Procedure Laterality Date  . bilateral arm lift surgery  2005  . CHOLECYSTECTOMY  11/01/2011   Procedure: LAPAROSCOPIC CHOLECYSTECTOMY;  Surgeon: Donato Heinz, MD;  Location: [facility];  Service: General;  Laterality: N/A;  . colonscopy    . DIAGNOSTIC LAPAROSCOPY  1994   x 2  . DILATION AND CURETTAGE OF UTERUS  10/12/2011   Procedure: DILATATION AND CURETTAGE;  Surgeon: Cyril Mourning, MD;  Location: [place] [facility];  Service: Gynecology;  Laterality: N/A;  . DILATION AND CURETTAGE OF UTERUS N/A 08/19/2013   Procedure: DILATATION AND CURETTAGE;  Surgeon: Cyril Mourning, MD;  Location: [facility];  Service: Gynecology;  Laterality: N/A;  . 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 GASTRIC BYPASS    . OVARIAN CYST REMOVAL  '95  . removal of IUD    . right tube removed & ovarian cyst '11    . THYROIDECTOMY    . TONSILLECTOMY AND ADENOIDECTOMY    . TUBAL LIGATION N/A 03/06/2017   Procedure: POST PARTUM TUBAL LIGATION;  Surgeon: Dian Queen, MD;  Location: [place];  Service: Gynecology;  Laterality: N/A;    No Known Allergies No current facility-administered medications on file prior to encounter.    Current Outpatient Medications on File Prior to Encounter  Medication Sig Dispense Refill  . acetaminophen (TYLENOL) 500 MG tablet Take 500 mg by mouth every 6 (six) hours as needed for mild pain, moderate pain, fever or headache.     . ALPRAZolam (XANAX) 1 MG tablet Take 1 tablet po BID prn anxiety 45 tablet 2  . buPROPion (WELLBUTRIN XL) 150 MG 24 hr tablet Take 1 tablet (150 mg total) by mouth daily for 30 days. 30 tablet 2  . calcium citrate (CALCITRATE - DOSED IN MG ELEMENTAL CALCIUM) 950 MG tablet Take 200 mg of elemental calcium by mouth daily.    . Cholecalciferol  (VITAMIN D3 PO) Take by mouth.    . levothyroxine (SYNTHROID, LEVOTHROID) 137 MCG tablet Take 137 mcg by mouth 2 (two) times daily.     . Multiple Vitamin (MULTIVITAMIN) tablet Take 1 tablet by mouth daily.    Marland Kitchen omeprazole (PRILOSEC) 40 MG capsule Take 40 mg by mouth daily as needed (for acid reflux).   3  . propranolol (INDERAL) 10 MG tablet Take 1-2 tabs po BID prn anxiety 120 tablet 1  . sertraline (ZOLOFT) 20 MG/ML concentrated solution Take 10 mLs (200 mg total) by mouth daily for 30 days. 300 mL 1   Social History   Socioeconomic History  . Marital status: Married    Spouse name: Not on file  . Number of children: Not on file  . Years of education: Not on file  . Highest education level: Not on file  Occupational History  . Not on file  Social Needs  . Financial resource strain: Not on file  . Food insecurity:    Worry: Not on file    Inability: Not on file  . Transportation needs:    Medical: Not on file    Non-medical: Not on file  Tobacco Use  . Smoking status: Former Smoker    Packs/day: 1.00    Years: 5.00    Pack years: 5.00    Types: Cigarettes    Last attempt to quit: 01/26/2005    Years since quitting: 14.0  . Smokeless tobacco: Never Used  Substance and Sexual Activity  . Alcohol use: Not Currently  . Drug use: No  . Sexual activity: Yes    Birth control/protection: None  Lifestyle  . Physical activity:    Days per week: Not on file    Minutes per session: Not on file  . Stress: Not on file  Relationships  . Social connections:    Talks on phone: Not on file    Gets together: Not on file    Attends religious service: Not on file    Active member of club or organization: Not on file    Attends meetings of clubs or organizations: Not on file    Relationship status: Not on file  . Intimate partner violence:    Fear of current or ex partner: Not on file    Emotionally abused: Not on file    Physically abused: Not on file    Forced sexual activity: Not  on file  Other Topics Concern  . Not on file  Social History Narrative  . Not on file    Family History  Problem Relation Age of Onset  . Heart attack Father  Age 67  . Hypertension Father   . Heart disease Father   . Diabetes Paternal Grandmother   . Heart disease Paternal Grandmother   . Breast cancer Paternal Grandmother   . Heart disease Paternal Grandfather   . Stroke Paternal Grandfather   . Bipolar disorder Maternal Grandmother   . Anxiety disorder Daughter   . Anesthesia problems Neg Hx   . Hypotension Neg Hx   . Malignant hyperthermia Neg Hx   . Pseudochol deficiency Neg Hx      ROS:  As stated in the HPI and negative for all other systems.  Physical Exam: Blood pressure 102/61, pulse (!) 58, temperature 97.9 F (36.6 C), temperature source Oral, resp. rate 16, height 5\' 4"  (1.626 m), weight 117 kg, SpO2 98 %, currently breastfeeding.  GENERAL:  Well appearing HEENT:  Pupils equal round and reactive, fundi not visualized, oral mucosa unremarkable NECK:  No jugular venous distention, waveform within normal limits, carotid upstroke brisk and symmetric, no bruits, no thyromegaly LYMPHATICS:  No cervical, inguinal adenopathy LUNGS:  Clear to auscultation bilaterally BACK:  No CVA tenderness CHEST:  Unremarkable HEART:  PMI not displaced or sustained,S1 and S2 within normal limits, no S3, no S4, no clicks, no rubs, no low murmurs ABD:  Flat, positive bowel sounds normal in frequency in pitch, no bruits, no rebound, no guarding, no midline pulsatile mass, no hepatomegaly, no splenomegaly EXT:  2 plus pulses throughout, no edema, no cyanosis no clubbing SKIN:  No rashes no nodules NEURO:  Cranial nerves II through XII grossly intact, motor grossly intact throughout PSYCH:  Cognitively intact, oriented to person place and time  Labs: Lab Results  Component Value Date   BUN 16 01/27/2019   Lab Results  Component Value Date   CREATININE 0.71 01/27/2019   Lab  Results  Component Value Date   NA 136 01/27/2019   K 4.1 01/27/2019   CL 102 01/27/2019   CO2 21 (L) 01/27/2019   Lab Results  Component Value Date   TROPONINI <0.03 01/27/2019   Lab Results  Component Value Date   WBC 7.6 01/27/2019   HGB 11.4 (L) 01/27/2019   HCT 37.5 01/27/2019   MCV 78.3 (L) 01/27/2019   PLT 390 01/27/2019   No results found for: CHOL, HDL, LDLCALC, LDLDIRECT, TRIG, CHOLHDL Lab Results  Component Value Date   ALT 13 (L) 01/16/2017   AST 21 01/16/2017   ALKPHOS 107 01/16/2017   BILITOT 0.2 (L) 01/16/2017   No results found for: CHOL, TRIG, HDL, LDLCALC, LDLDIRECT   Radiology:  CXR:  The heart size and mediastinal contours are within normal limits. Both lungs are clear. The visualized skeletal structures are unremarkable.  EKG:  NSR, rate 94, borderline QT prolongation, no acute ST T wave changes.    ASSESSMENT AND PLAN:   CHEST PAIN:    Patient has chest pain with atypical greater than typical symptoms.  She does have risk factors with family history.  She has a previous smoking history.  She can be discharged and I will suggest aspirin and sublingual nitroglycerin.  We will arrange for outpatient stress testing.  Since we are not doing POET (Plain Old Exercise Treadmill) I will plan Lexiscan Myoview.  ANEMIA:   Follow-up per her primary provider.  I might suggest GI work-up if she has a negative perfusion study.  SignedMinus Breeding 01/27/2019, 2:21 PM

## 2019-01-27 NOTE — Telephone Encounter (Signed)
Please call pt and let her know that we received a refill request for Sertraline tabs. Please ask if the oral solution was too expensive and/or not covered by insurance and she is now needing the tabs or was request for Sertraline tabs automatically generated by the pharmacy?

## 2019-01-29 ENCOUNTER — Telehealth: Payer: Self-pay

## 2019-01-29 NOTE — Telephone Encounter (Signed)
New message  Call patient to schedule myocardial perfusion that was ordered on June 1st.    6.3.20 @ 10:12am lm on cell phone vm to call [name]  [phone]   6.2.20 @ 12:06pm lm on cell phone vm  - home # is daughter phone # [name]

## 2019-01-29 NOTE — Telephone Encounter (Signed)
Left Vm to return my call.

## 2019-01-30 ENCOUNTER — Telehealth (HOSPITAL_COMMUNITY): Payer: Self-pay | Admitting: *Deleted

## 2019-01-30 NOTE — Telephone Encounter (Signed)
Pt. Said she would like to go back to the tablets because the solution is horrible tasting. I let her know that you left a note for the pharmacy to fill either one. She said thank you and for future RX it can be the tablets.

## 2019-01-30 NOTE — Telephone Encounter (Signed)
Close encounter 

## 2019-01-31 ENCOUNTER — Other Ambulatory Visit: Payer: Self-pay

## 2019-01-31 ENCOUNTER — Ambulatory Visit (HOSPITAL_COMMUNITY)
Admission: RE | Admit: 2019-01-31 | Discharge: 2019-01-31 | Disposition: A | Discharge status: Home / Self Care | Payer: BLUE CROSS/BLUE SHIELD | Source: Ambulatory Visit | Attending: Cardiovascular Disease | Admitting: Cardiovascular Disease

## 2019-01-31 DIAGNOSIS — R079 Chest pain, unspecified: Secondary | ICD-10-CM

## 2019-01-31 LAB — MYOCARDIAL PERFUSION IMAGING
LV dias vol: 103 mL (ref 46–106)
LV sys vol: 46 mL
Peak HR: 127 {beats}/min
Rest HR: 75 {beats}/min
SDS: 2
SRS: 1
SSS: 3
TID: 0.95

## 2019-01-31 MED ORDER — TECHNETIUM TC 99M TETROFOSMIN IV KIT
10.9000 | PACK | Freq: Once | INTRAVENOUS | Status: AC | PRN
  Administered 2019-01-31: 10.9 via INTRAVENOUS
  Filled 2019-01-31: qty 11

## 2019-01-31 MED ORDER — TECHNETIUM TC 99M TETROFOSMIN IV KIT
28.1000 | PACK | Freq: Once | INTRAVENOUS | Status: AC | PRN
  Administered 2019-01-31: 28.1 via INTRAVENOUS
  Filled 2019-01-31: qty 29

## 2019-01-31 MED ORDER — REGADENOSON 0.4 MG/5ML IV SOLN
0.4000 mg | Freq: Once | INTRAVENOUS | Status: AC
  Administered 2019-01-31: 0.4 mg via INTRAVENOUS

## 2019-01-31 MED ORDER — AMINOPHYLLINE 25 MG/ML IV SOLN
75.0000 mg | Freq: Once | INTRAVENOUS | Status: AC
  Administered 2019-01-31: 75 mg via INTRAVENOUS

## 2019-02-04 ENCOUNTER — Telehealth: Payer: Self-pay | Admitting: *Deleted

## 2019-02-04 NOTE — Telephone Encounter (Signed)
Left a message for the patient to call back.  

## 2019-02-04 NOTE — Telephone Encounter (Signed)
-----   Message from Darreld Mclean, [place] sent at 02/03/2019 12:09 PM EDT ----- Please notify patient of results: Stress test came back normal. No signs of ischemia (reduced blood flow to the heart).  Thank you!

## 2019-02-05 NOTE — Telephone Encounter (Signed)
Patient made aware of results and verbalized understanding.  

## 2019-02-13 ENCOUNTER — Other Ambulatory Visit: Payer: Self-pay | Admitting: Psychiatry

## 2019-02-13 DIAGNOSIS — F4310 Post-traumatic stress disorder, unspecified: Secondary | ICD-10-CM

## 2019-02-13 DIAGNOSIS — F330 Major depressive disorder, recurrent, mild: Secondary | ICD-10-CM

## 2019-02-13 DIAGNOSIS — F4010 Social phobia, unspecified: Secondary | ICD-10-CM

## 2019-02-13 NOTE — Telephone Encounter (Signed)
Check with pt if she's on solution or pills?

## 2019-02-14 NOTE — Telephone Encounter (Signed)
Left voicemail to double check if pt on solution or tablets?

## 2019-02-17 NOTE — Telephone Encounter (Signed)
I've tried to reach pt about this if she is taking solution or pills?

## 2019-02-28 ENCOUNTER — Other Ambulatory Visit: Payer: Self-pay | Admitting: Psychiatry

## 2019-02-28 DIAGNOSIS — F4310 Post-traumatic stress disorder, unspecified: Secondary | ICD-10-CM

## 2019-02-28 DIAGNOSIS — F4010 Social phobia, unspecified: Secondary | ICD-10-CM

## 2019-06-12 ENCOUNTER — Other Ambulatory Visit: Payer: Self-pay | Admitting: Psychiatry

## 2019-06-12 DIAGNOSIS — F4310 Post-traumatic stress disorder, unspecified: Secondary | ICD-10-CM

## 2019-06-12 DIAGNOSIS — F4010 Social phobia, unspecified: Secondary | ICD-10-CM

## 2019-06-12 NOTE — Telephone Encounter (Signed)
appt 10/28

## 2019-06-25 ENCOUNTER — Ambulatory Visit (INDEPENDENT_AMBULATORY_CARE_PROVIDER_SITE_OTHER): Payer: BLUE CROSS/BLUE SHIELD | Admitting: Psychiatry

## 2019-06-25 ENCOUNTER — Other Ambulatory Visit: Payer: Self-pay

## 2019-06-25 ENCOUNTER — Encounter: Payer: Self-pay | Admitting: Psychiatry

## 2019-06-25 DIAGNOSIS — F4010 Social phobia, unspecified: Secondary | ICD-10-CM | POA: Diagnosis not present

## 2019-06-25 DIAGNOSIS — F4310 Post-traumatic stress disorder, unspecified: Secondary | ICD-10-CM | POA: Diagnosis not present

## 2019-06-25 DIAGNOSIS — F330 Major depressive disorder, recurrent, mild: Secondary | ICD-10-CM | POA: Diagnosis not present

## 2019-06-25 DIAGNOSIS — G2581 Restless legs syndrome: Secondary | ICD-10-CM | POA: Diagnosis not present

## 2019-06-25 MED ORDER — GABAPENTIN 300 MG PO CAPS: ORAL_CAPSULE | ORAL | 1 refills | Status: DC

## 2019-06-25 MED ORDER — SERTRALINE HCL 100 MG PO TABS: 200.0000 mg | ORAL_TABLET | Freq: Every day | ORAL | 1 refills | Status: DC

## 2019-06-25 MED ORDER — BUPROPION HCL ER (XL) 150 MG PO TB24: 150.0000 mg | ORAL_TABLET | Freq: Every day | ORAL | 2 refills | Status: DC

## 2019-06-25 NOTE — Progress Notes (Signed)
[name] [id number] 1976-03-25 43 y.o.  Virtual Visit via Telephone Note  I connected with pt on 06/25/19 at 10:30 AM EDT by telephone and verified that I am speaking with the correct person using two identifiers.   I discussed the limitations, risks, security and privacy concerns of performing an evaluation and management service by telephone and the availability of in person appointments. I also discussed with the patient that there may be a patient responsible charge related to this service. The patient expressed understanding and agreed to proceed.   I discussed the assessment and treatment plan with the patient. The patient was provided an opportunity to ask questions and all were answered. The patient agreed with the plan and demonstrated an understanding of the instructions.   The patient was advised to call back or seek an in-person evaluation if the symptoms worsen or if the condition fails to improve as anticipated.  I provided 25 minutes of non-face-to-face time during this encounter.  The patient was located at home.  The provider was located at [place].   Thayer Headings, PMHNP   Subjective:   Patient ID:  [name] is a 43 y.o. (DOB 22-Nov-1975) female.  Chief Complaint:  Chief Complaint  Patient presents with  . Sleeping Problem    RLS  . Anxiety    HPI [name] presents for follow-up of anxiety, depression, and sleep disturbance. She reports that she stopped taking Wellbutrin and has been feeling more sluggish and has gained 10 lbs. She reports that she has started having some OCD s/s to include obsession about having hair in the floor. Has been sweeping and vacuuming frequently to prevent hair on the floor. She reports that she is afraid to step on hair and tells family they need to sweep after brushing their hair. Has been cleaning excessively and wanting house to be "spotless." She reports that it causes her some distress  and family is commenting that it is irritating to them. No longer checking doors since they are not leaving home due to the pandemic. Reports that her anxiety has been higher in general. She is often having to take Xanax prn BID to include at bedtime to help with insomnia and RLS. Reports that she has racing thoughts and worry that can interfere with falling asleep. Estimates sleeping 6-7 hours a night. Denies nightmares. She reports that she typically only has panic attacks when she is in the car on longer trips. "The only thing I get really depressed about is my weight." Reports that she lost 120 lbs after gastric bypass and has gained back 20 lbs. She reports that appetite has been good and has been stress eating throughout the day. Energy and motivation have been fair. Occ difficulty with concentration. Denies SI.   She is continuing to work from home since the start of the pandemic.  Past Medication Trials: Buspar- HA's Wellbutrin XL- helped with weight loss in the past Sertraline- Effective Melatonin  Review of Systems:  Review of Systems  Endocrine:       Recent abnormal thyroid levels and reports that energy has improved since change in thyroid med  Musculoskeletal: Negative for gait problem.  Neurological: Negative for tremors.       Restless leg movements.   Psychiatric/Behavioral:       Please refer to HPI   Reports that she had numbness in her arm and had cardiac heart w/u which was WNL. She reports that she had similar adverse reaction  when she took after bariatric vitamin again.   Medications: I have reviewed the patient's current medications.  Current Outpatient Medications  Medication Sig Dispense Refill  . acetaminophen (TYLENOL) 500 MG tablet Take 500 mg by mouth every 6 (six) hours as needed for mild pain, moderate pain, fever or headache.     . ALPRAZolam (XANAX) 1 MG tablet TAKE 1 TABLET BY MOUTH TWICE A DAY AS NEEDED FOR ANXIETY 45 tablet 2  . calcium citrate  (CALCITRATE - DOSED IN MG ELEMENTAL CALCIUM) 950 MG tablet Take 200 mg of elemental calcium by mouth daily.    . Cholecalciferol (VITAMIN D3 PO) Take by mouth.    . levothyroxine (SYNTHROID) 300 MCG tablet Take 137 mcg by mouth daily.     . Multiple Vitamin (MULTIVITAMIN) tablet Take 1 tablet by mouth daily.    Marland Kitchen omeprazole (PRILOSEC) 40 MG capsule Take 40 mg by mouth daily as needed (for acid reflux).   3  . sertraline (ZOLOFT) 100 MG tablet Take 2 tablets (200 mg total) by mouth daily. 180 tablet 1  . buPROPion (WELLBUTRIN XL) 150 MG 24 hr tablet Take 1 tablet (150 mg total) by mouth daily. 30 tablet 2  . gabapentin (NEURONTIN) 300 MG capsule Take 1 capsule po 2 hours before bedtime. 30 capsule 1  . nitroGLYCERIN (NITROSTAT) 0.4 MG SL tablet Place 1 tablet (0.4 mg total) under the tongue every 5 (five) minutes as needed for chest pain. 30 tablet 0   No current facility-administered medications for this visit.     Medication Side Effects: None  Allergies: No Known Allergies  Past Medical History:  Diagnosis Date  . Anemia    with iron infusions  . Anxiety    panic attacks  . Cancer [facility]) 2010   thyroidectomy  . Dysrhythmia    no meds currently- palpitations assoc with panic disorder or thyroid disease  . GERD (gastroesophageal reflux disease)   . Hypothyroidism   . PCOS (polycystic ovarian syndrome)   . PONV (postoperative nausea and vomiting)   . Thyroid disease     Family History  Problem Relation Age of Onset  . Heart attack Father 30       Died age 52  . Hypertension Father   . Heart disease Father   . Diabetes Paternal Grandmother   . Heart disease Paternal Grandmother   . Breast cancer Paternal Grandmother   . Heart disease Paternal Grandfather   . Stroke Paternal Grandfather   . Bipolar disorder Maternal Grandmother   . Anxiety disorder Daughter   . Anesthesia problems Neg Hx   . Hypotension Neg Hx   . Malignant hyperthermia Neg Hx   . Pseudochol deficiency  Neg Hx     Social History   Socioeconomic History  . Marital status: Married    Spouse name: Not on file  . Number of children: Not on file  . Years of education: Not on file  . Highest education level: Not on file  Occupational History  . Not on file  Social Needs  . Financial resource strain: Not on file  . Food insecurity    Worry: Not on file    Inability: Not on file  . Transportation needs    Medical: Not on file    Non-medical: Not on file  Tobacco Use  . Smoking status: Former Smoker    Packs/day: 1.00    Years: 5.00    Pack years: 5.00    Types: Cigarettes  Quit date: 01/26/2005    Years since quitting: 14.4  . Smokeless tobacco: Never Used  Substance and Sexual Activity  . Alcohol use: Not Currently  . Drug use: No  . Sexual activity: Yes    Birth control/protection: None  Lifestyle  . Physical activity    Days per week: Not on file    Minutes per session: Not on file  . Stress: Not on file  Relationships  . Social Herbalist on phone: Not on file    Gets together: Not on file    Attends religious service: Not on file    Active member of club or organization: Not on file    Attends meetings of clubs or organizations: Not on file    Relationship status: Not on file  . Intimate partner violence    Fear of current or ex partner: Not on file    Emotionally abused: Not on file    Physically abused: Not on file    Forced sexual activity: Not on file  Other Topics Concern  . Not on file  Social History Narrative   Lives at home with husband, 2, 54, 90, 62, 15, 44, granddaughter.      Past Medical History, Surgical history, Social history, and Family history were reviewed and updated as appropriate.   Please see review of systems for further details on the patient's review from today.   Objective:   Physical Exam:  There were no vitals taken for this visit.  Physical Exam Neurological:     Mental Status: She is alert and oriented to  person, place, and time.     Cranial Nerves: No dysarthria.  Psychiatric:        Attention and Perception: Attention normal.        Mood and Affect: Mood is anxious.        Speech: Speech normal.        Behavior: Behavior is cooperative.        Thought Content: Thought content normal. Thought content is not paranoid or delusional. Thought content does not include homicidal or suicidal ideation. Thought content does not include homicidal or suicidal plan.        Cognition and Memory: Cognition and memory normal.        Judgment: Judgment normal.     Lab Review:     Component Value Date/Time   NA 136 01/27/2019 1001   K 4.1 01/27/2019 1001   CL 102 01/27/2019 1001   CO2 21 (L) 01/27/2019 1001   GLUCOSE 101 (H) 01/27/2019 1001   BUN 16 01/27/2019 1001   CREATININE 0.71 01/27/2019 1001   CALCIUM 8.9 01/27/2019 1001   PROT 6.3 (L) 01/16/2017 1150   ALBUMIN 2.5 (L) 01/16/2017 1150   AST 21 01/16/2017 1150   ALT 13 (L) 01/16/2017 1150   ALKPHOS 107 01/16/2017 1150   BILITOT 0.2 (L) 01/16/2017 1150   GFRNONAA >60 01/27/2019 1001   GFRAA >60 01/27/2019 1001       Component Value Date/Time   WBC 7.6 01/27/2019 1001   RBC 4.79 01/27/2019 1001   HGB 11.4 (L) 01/27/2019 1001   HCT 37.5 01/27/2019 1001   PLT 390 01/27/2019 1001   MCV 78.3 (L) 01/27/2019 1001   MCH 23.8 (L) 01/27/2019 1001   MCHC 30.4 01/27/2019 1001   RDW 15.9 (H) 01/27/2019 1001   LYMPHSABS 2.3 03/13/2017 1829   MONOABS 0.2 03/13/2017 1829   EOSABS 0.1 03/13/2017 1829   BASOSABS  0.0 03/13/2017 1829    No results found for: POCLITH, LITHIUM   No results found for: PHENYTOIN, PHENOBARB, VALPROATE, CBMZ   .res Assessment: Plan:   Is seen for 25 minutes and greater than 50% of visit spent counseling patient and coordination of care to include discussing reason that obsessive thoughts and compulsions may be emerging.  Discussed possible treatment options for anxiety.  Also answered patient's questions regarding  possible causes of restless leg syndrome.Reports that patient reports that RLS is likely unrelated to sertraline since she has been taking this long-term without any previous issues.  Gust potential benefits, risks, and side effects of gabapentin.  Discussed that it is used to treat RL S and is also used off label to treat anxiety.  Discussed that gabapentin typically causes some drowsiness and may help some with sleep without causing significant somnolence where she would be unable to care for her children if needed.  Discussed that other option would be to increase sertraline to improve anxiety and depressive signs and symptoms.  Discussed that 200 mg daily is the maximum amount approved by the FDA, however higher dose in her situation may be appropriate considering history of gastric bypass surgery and possible decreased absorption of sertraline and therefore requiring higher dose.  Patient reports that she would prefer to start with gabapentin and may consider increase in sertraline in the future. Will restart Wellbutrin XL 150 mg daily since patient reports that she has had lower motivation, increased appetite, and weight gain since stopping Wellbutrin a few months ago.  Agree that resuming psychotherapy with [name], [facility] would also likely be helpful for acute exacerbation of anxiety and patient assisted with scheduling appointment to see therapist. Patient to follow-up with this provider in 4 weeks or sooner if clinically indicated. Patient advised to contact office with any questions, adverse effects, or acute worsening in signs and symptoms.   Nafia was seen today for sleeping problem and anxiety.  Diagnoses and all orders for this visit:  Social phobia -     sertraline (ZOLOFT) 100 MG tablet; Take 2 tablets (200 mg total) by mouth daily.  Mild episode of recurrent major depressive disorder (HCC) -     sertraline (ZOLOFT) 100 MG tablet; Take 2 tablets (200 mg total) by mouth daily. -      buPROPion (WELLBUTRIN XL) 150 MG 24 hr tablet; Take 1 tablet (150 mg total) by mouth daily.  Posttraumatic stress disorder -     sertraline (ZOLOFT) 100 MG tablet; Take 2 tablets (200 mg total) by mouth daily.  RLS (restless legs syndrome) -     gabapentin (NEURONTIN) 300 MG capsule; Take 1 capsule po 2 hours before bedtime.    Please see After Visit Summary for patient specific instructions.  No future appointments.  No orders of the defined types were placed in this encounter.     -------------------------------

## 2019-07-30 ENCOUNTER — Ambulatory Visit: Payer: BLUE CROSS/BLUE SHIELD | Admitting: Psychiatry

## 2019-08-06 ENCOUNTER — Ambulatory Visit: Payer: BLUE CROSS/BLUE SHIELD | Admitting: Psychiatry

## 2019-08-06 ENCOUNTER — Other Ambulatory Visit: Payer: Self-pay

## 2019-08-06 DIAGNOSIS — Z20822 Contact with and (suspected) exposure to covid-19: Secondary | ICD-10-CM

## 2019-08-07 LAB — NOVEL CORONAVIRUS, NAA: SARS-CoV-2, NAA: DETECTED — AB

## 2020-01-09 ENCOUNTER — Other Ambulatory Visit: Payer: Self-pay | Admitting: Psychiatry

## 2020-01-09 DIAGNOSIS — F4310 Post-traumatic stress disorder, unspecified: Secondary | ICD-10-CM

## 2020-01-09 DIAGNOSIS — F4010 Social phobia, unspecified: Secondary | ICD-10-CM

## 2020-01-09 NOTE — Telephone Encounter (Signed)
Has apt 05/19

## 2020-01-14 ENCOUNTER — Telehealth (INDEPENDENT_AMBULATORY_CARE_PROVIDER_SITE_OTHER): Payer: BLUE CROSS/BLUE SHIELD | Admitting: Psychiatry

## 2020-01-14 ENCOUNTER — Encounter: Payer: Self-pay | Admitting: Psychiatry

## 2020-01-14 VITALS — BP 140/90 | HR 100

## 2020-01-14 DIAGNOSIS — F5081 Binge eating disorder: Secondary | ICD-10-CM

## 2020-01-14 DIAGNOSIS — F50819 Binge eating disorder, unspecified: Secondary | ICD-10-CM

## 2020-01-14 DIAGNOSIS — F4010 Social phobia, unspecified: Secondary | ICD-10-CM

## 2020-01-14 DIAGNOSIS — F330 Major depressive disorder, recurrent, mild: Secondary | ICD-10-CM

## 2020-01-14 DIAGNOSIS — F4310 Post-traumatic stress disorder, unspecified: Secondary | ICD-10-CM

## 2020-01-14 MED ORDER — ALPRAZOLAM 1 MG PO TABS: 1.0000 mg | ORAL_TABLET | Freq: Two times a day (BID) | ORAL | 5 refills | Status: DC | PRN

## 2020-01-14 MED ORDER — TOPIRAMATE 25 MG PO TABS: ORAL_TABLET | ORAL | 1 refills | Status: DC

## 2020-01-14 MED ORDER — SERTRALINE HCL 100 MG PO TABS: 200.0000 mg | ORAL_TABLET | Freq: Every day | ORAL | 1 refills | Status: DC

## 2020-01-14 NOTE — Progress Notes (Signed)
[name] [id number] December 04, 1975 44 y.o.  Virtual Visit via Telephone Note  I connected with pt on 01/14/20 at  8:00 AM EDT by telephone and verified that I am speaking with the correct person using two identifiers.   I discussed the limitations, risks, security and privacy concerns of performing an evaluation and management service by telephone and the availability of in person appointments. I also discussed with the patient that there may be a patient responsible charge related to this service. The patient expressed understanding and agreed to proceed.   I discussed the assessment and treatment plan with the patient. The patient was provided an opportunity to ask questions and all were answered. The patient agreed with the plan and demonstrated an understanding of the instructions.   The patient was advised to call back or seek an in-person evaluation if the symptoms worsen or if the condition fails to improve as anticipated.  I provided 30 minutes of non-face-to-face time during this encounter.  The patient was located at home.  The provider was located at [place].   Thayer Headings, PMHNP   Subjective:   Patient ID:  [name] is a 44 y.o. (DOB 07/28/1976) female.  Chief Complaint: No chief complaint on file.   HPI [name] presents for follow-up of anxiety, depression, and sleep disturbance. She reports that she stopped Gabapentin since she reports that it was not helpful for her anxiety. She reports that Wellbutrin XL was "helpful but had suicidal thoughts." describes her mood as improved with Wellbutrin XL and then had intrusive thoughts of suicide late in the day. She reports that she has not had any phobia of hair recently. Reports that she is taking Xanax every night to fall asleep and is taking some Xanax throughout the day. She reports that she can sleep through the night with Xanax and an RLS supplement, otherwise she is up in the  middle of the night. She reports that she "always have anxiety." Has panic attacks every time she is in a vehicle. She reports that compulsions are manageable with medication. Denies excessive cleaning. She reports that her worry is less with Sertraline. She reports that her mood has been more depressed in response to wt gain.   Lost 120 lbs initially with gastric bypass and reports that she lost about 20 lbs on keto and then has and gained about 30 lbs back. She reports that she has been binge eating on pork rinds and is eating them throughout the day. Reports that she has not had binge eating in the past and that it started over 3 months ago. She reports that Wellbutrin XL did not decrease appetite. She has reach out to bariatric clinic re: a revision. She reports that she has some difficulty with concentration and focus. She reports that she notices improved focus when she drinks a supplement. She reports that her energy has been low and will stay in bed on the weekends. She reports that her motivation is low. She reports occ vague fleeting thoughts of SI. Denies suicidal intent.   Continues to work from home. Reports that her children 2,4, and 29 yo have been home throughout the pandemic. Pt's mother comes to watch them during the day while pt works.   Stopped Wellbutrin XL about 3 months.  Past Medication Trials: Buspar- HA's Wellbutrin XL- helped with weight loss in the past Sertraline- Effective Melatonin Gabapentin- not helpful for anxiety, RLS, or insomnia Propranolol Ambien  Review of Systems:  Review of Systems  Cardiovascular: Positive for palpitations.  Gastrointestinal: Negative.   Musculoskeletal: Negative for gait problem.  Neurological: Positive for headaches. Negative for tremors.  Psychiatric/Behavioral:       Please refer to HPI   Reports RLS only occurs at nighttime. Reports that it started when she was pregnant and is relieved with supplement.   Medications: I have  reviewed the patient's current medications.  Current Outpatient Medications  Medication Sig Dispense Refill  . acetaminophen (TYLENOL) 500 MG tablet Take 500 mg by mouth every 6 (six) hours as needed for mild pain, moderate pain, fever or headache.     . calcium citrate (CALCITRATE - DOSED IN MG ELEMENTAL CALCIUM) 950 MG tablet Take 200 mg of elemental calcium by mouth daily.    . Cholecalciferol (VITAMIN D3 PO) Take by mouth.    . levothyroxine (SYNTHROID) 300 MCG tablet Take 300 mcg by mouth daily.     . Multiple Vitamin (MULTIVITAMIN) tablet Take 1 tablet by mouth daily.    Marland Kitchen omeprazole (PRILOSEC) 40 MG capsule Take 40 mg by mouth daily as needed (for acid reflux).   3  . UNABLE TO FIND [place] for RLS    . ALPRAZolam (XANAX) 1 MG tablet Take 1 tablet (1 mg total) by mouth 2 (two) times daily as needed for anxiety or sleep. 60 tablet 5  . nitroGLYCERIN (NITROSTAT) 0.4 MG SL tablet Place 1 tablet (0.4 mg total) under the tongue every 5 (five) minutes as needed for chest pain. 30 tablet 0  . sertraline (ZOLOFT) 100 MG tablet Take 2 tablets (200 mg total) by mouth daily. 180 tablet 1  . topiramate (TOPAMAX) 25 MG tablet Take 1 tablet at bedtime for one week, then may increase to 1 tablet po BID as tolerated 60 tablet 1   No current facility-administered medications for this visit.    Medication Side Effects: None  Allergies: No Known Allergies  Past Medical History:  Diagnosis Date  . Anemia    with iron infusions  . Anxiety    panic attacks  . Cancer [facility]) 2010   thyroidectomy  . Dysrhythmia    no meds currently- palpitations assoc with panic disorder or thyroid disease  . GERD (gastroesophageal reflux disease)   . Hypothyroidism   . PCOS (polycystic ovarian syndrome)   . PONV (postoperative nausea and vomiting)   . Thyroid disease     Family History  Problem Relation Age of Onset  . Heart attack Father 18       Died age 55  . Hypertension Father   . Heart disease Father    . Diabetes Paternal Grandmother   . Heart disease Paternal Grandmother   . Breast cancer Paternal Grandmother   . Heart disease Paternal Grandfather   . Stroke Paternal Grandfather   . Bipolar disorder Maternal Grandmother   . Anxiety disorder Daughter   . Anesthesia problems Neg Hx   . Hypotension Neg Hx   . Malignant hyperthermia Neg Hx   . Pseudochol deficiency Neg Hx     Social History   Socioeconomic History  . Marital status: Married    Spouse name: Not on file  . Number of children: Not on file  . Years of education: Not on file  . Highest education level: Not on file  Occupational History  . Not on file  Tobacco Use  . Smoking status: Former Smoker    Packs/day: 1.00    Years: 5.00    Pack years: 5.00  Types: Cigarettes    Quit date: 01/26/2005    Years since quitting: 14.9  . Smokeless tobacco: Never Used  Substance and Sexual Activity  . Alcohol use: Not Currently  . Drug use: No  . Sexual activity: Yes    Birth control/protection: None  Other Topics Concern  . Not on file  Social History Narrative   Lives at home with husband, 2, 42, 60, 5, 56, 25, granddaughter.     Social Determinants of Health   Financial Resource Strain:   . Difficulty of Paying Living Expenses:   Food Insecurity:   . Worried About Charity fundraiser in the Last Year:   . Arboriculturist in the Last Year:   Transportation Needs:   . Film/video editor (Medical):   Marland Kitchen Lack of Transportation (Non-Medical):   Physical Activity:   . Days of Exercise per Week:   . Minutes of Exercise per Session:   Stress:   . Feeling of Stress :   Social Connections:   . Frequency of Communication with Friends and Family:   . Frequency of Social Gatherings with Friends and Family:   . Attends Religious Services:   . Active Member of Clubs or Organizations:   . Attends Archivist Meetings:   Marland Kitchen Marital Status:   Intimate Partner Violence:   . Fear of Current or Ex-Partner:    . Emotionally Abused:   Marland Kitchen Physically Abused:   . Sexually Abused:     Past Medical History, Surgical history, Social history, and Family history were reviewed and updated as appropriate.   Please see review of systems for further details on the patient's review from today.   Objective:   Physical Exam:  BP 140/90   Pulse 100   Physical Exam Neurological:     Mental Status: She is alert and oriented to person, place, and time.     Cranial Nerves: No dysarthria.  Psychiatric:        Attention and Perception: Attention and perception normal.        Mood and Affect: Mood is anxious and depressed.        Speech: Speech normal.        Behavior: Behavior is cooperative.        Thought Content: Thought content normal. Thought content is not paranoid or delusional. Thought content does not include homicidal or suicidal ideation. Thought content does not include homicidal or suicidal plan.        Cognition and Memory: Cognition and memory normal.        Judgment: Judgment normal.     Comments: Insight intact     Lab Review:     Component Value Date/Time   NA 136 01/27/2019 1001   K 4.1 01/27/2019 1001   CL 102 01/27/2019 1001   CO2 21 (L) 01/27/2019 1001   GLUCOSE 101 (H) 01/27/2019 1001   BUN 16 01/27/2019 1001   CREATININE 0.71 01/27/2019 1001   CALCIUM 8.9 01/27/2019 1001   PROT 6.3 (L) 01/16/2017 1150   ALBUMIN 2.5 (L) 01/16/2017 1150   AST 21 01/16/2017 1150   ALT 13 (L) 01/16/2017 1150   ALKPHOS 107 01/16/2017 1150   BILITOT 0.2 (L) 01/16/2017 1150   GFRNONAA >60 01/27/2019 1001   GFRAA >60 01/27/2019 1001       Component Value Date/Time   WBC 7.6 01/27/2019 1001   RBC 4.79 01/27/2019 1001   HGB 11.4 (L) 01/27/2019 1001   HCT 37.5  01/27/2019 1001   PLT 390 01/27/2019 1001   MCV 78.3 (L) 01/27/2019 1001   MCH 23.8 (L) 01/27/2019 1001   MCHC 30.4 01/27/2019 1001   RDW 15.9 (H) 01/27/2019 1001   LYMPHSABS 2.3 03/13/2017 1829   MONOABS 0.2 03/13/2017 1829    EOSABS 0.1 03/13/2017 1829   BASOSABS 0.0 03/13/2017 1829    No results found for: POCLITH, LITHIUM   No results found for: PHENYTOIN, PHENOBARB, VALPROATE, CBMZ   .res Assessment: Plan:   Discussed several treatment options for anxiety and mood, to include increasing sertraline and potential benefits, risks, and side effects of Topamax.  Discussed that Topamax is used off label for binge eating and anxiety and may be helpful for the signs and symptoms.  Patient agrees to trial of Topamax.  Patient advised to contact office if she experiences any cognitive side effects.  We will start Topamax 25 mg at bedtime for 1 week, then increase to 25 mg twice daily. Will increase quantity of Xanax to 1 mg twice daily as needed for anxiety and sleep to #60. Continue sertraline 200 mg daily for anxiety and depression. Patient to follow-up in 6 weeks or sooner if clinically indicated. Patient advised to contact office with any questions, adverse effects, or acute worsening in signs and symptoms.  Diagnoses and all orders for this visit:  Binge eating disorder -     topiramate (TOPAMAX) 25 MG tablet; Take 1 tablet at bedtime for one week, then may increase to 1 tablet po BID as tolerated  Social phobia -     ALPRAZolam (XANAX) 1 MG tablet; Take 1 tablet (1 mg total) by mouth 2 (two) times daily as needed for anxiety or sleep. -     sertraline (ZOLOFT) 100 MG tablet; Take 2 tablets (200 mg total) by mouth daily.  Posttraumatic stress disorder -     topiramate (TOPAMAX) 25 MG tablet; Take 1 tablet at bedtime for one week, then may increase to 1 tablet po BID as tolerated -     ALPRAZolam (XANAX) 1 MG tablet; Take 1 tablet (1 mg total) by mouth 2 (two) times daily as needed for anxiety or sleep. -     sertraline (ZOLOFT) 100 MG tablet; Take 2 tablets (200 mg total) by mouth daily.  Mild episode of recurrent major depressive disorder (HCC) -     sertraline (ZOLOFT) 100 MG tablet; Take 2 tablets (200 mg  total) by mouth daily.    Please see After Visit Summary for patient specific instructions.  No future appointments.  No orders of the defined types were placed in this encounter.     -------------------------------

## 2020-02-05 ENCOUNTER — Other Ambulatory Visit: Payer: Self-pay | Admitting: Psychiatry

## 2020-02-05 DIAGNOSIS — F4310 Post-traumatic stress disorder, unspecified: Secondary | ICD-10-CM

## 2020-02-05 DIAGNOSIS — F5081 Binge eating disorder: Secondary | ICD-10-CM

## 2020-02-11 ENCOUNTER — Other Ambulatory Visit (HOSPITAL_COMMUNITY): Payer: Self-pay | Admitting: Internal Medicine

## 2020-02-11 ENCOUNTER — Other Ambulatory Visit: Payer: Self-pay

## 2020-02-11 ENCOUNTER — Ambulatory Visit (HOSPITAL_COMMUNITY)
Admission: RE | Admit: 2020-02-11 | Discharge: 2020-02-11 | Disposition: A | Discharge status: Home / Self Care | Payer: 59 | Source: Ambulatory Visit | Attending: Internal Medicine | Admitting: Internal Medicine

## 2020-02-11 DIAGNOSIS — R0600 Dyspnea, unspecified: Secondary | ICD-10-CM | POA: Insufficient documentation

## 2020-02-17 ENCOUNTER — Other Ambulatory Visit: Payer: Self-pay | Admitting: Internal Medicine

## 2020-02-17 DIAGNOSIS — Z1231 Encounter for screening mammogram for malignant neoplasm of breast: Secondary | ICD-10-CM

## 2020-02-23 ENCOUNTER — Ambulatory Visit: Payer: 59

## 2020-02-26 ENCOUNTER — Other Ambulatory Visit: Payer: Self-pay | Admitting: Internal Medicine

## 2020-02-26 ENCOUNTER — Other Ambulatory Visit (HOSPITAL_COMMUNITY): Payer: Self-pay | Admitting: Internal Medicine

## 2020-02-26 DIAGNOSIS — R0600 Dyspnea, unspecified: Secondary | ICD-10-CM

## 2020-03-04 ENCOUNTER — Other Ambulatory Visit: Payer: Self-pay | Admitting: Psychiatry

## 2020-03-04 DIAGNOSIS — F5081 Binge eating disorder: Secondary | ICD-10-CM

## 2020-03-04 DIAGNOSIS — F4310 Post-traumatic stress disorder, unspecified: Secondary | ICD-10-CM

## 2020-03-17 ENCOUNTER — Ambulatory Visit (HOSPITAL_COMMUNITY)
Admission: RE | Admit: 2020-03-17 | Discharge: 2020-03-17 | Disposition: A | Discharge status: Home / Self Care | Payer: 59 | Source: Ambulatory Visit | Attending: Internal Medicine | Admitting: Internal Medicine

## 2020-03-17 ENCOUNTER — Other Ambulatory Visit: Payer: Self-pay

## 2020-03-17 DIAGNOSIS — R0600 Dyspnea, unspecified: Secondary | ICD-10-CM | POA: Insufficient documentation

## 2020-03-17 MED ORDER — IOHEXOL 350 MG/ML SOLN
100.0000 mL | Freq: Once | INTRAVENOUS | Status: AC | PRN
  Administered 2020-03-17: 100 mL via INTRAVENOUS

## 2020-03-18 ENCOUNTER — Other Ambulatory Visit: Payer: Self-pay | Admitting: Psychiatry

## 2020-03-18 DIAGNOSIS — F5081 Binge eating disorder: Secondary | ICD-10-CM

## 2020-03-18 DIAGNOSIS — F50819 Binge eating disorder, unspecified: Secondary | ICD-10-CM

## 2020-03-18 DIAGNOSIS — F4310 Post-traumatic stress disorder, unspecified: Secondary | ICD-10-CM

## 2020-04-02 ENCOUNTER — Ambulatory Visit: Payer: 59 | Admitting: Cardiology

## 2020-06-04 ENCOUNTER — Telehealth: Payer: Self-pay | Admitting: Hematology and Oncology

## 2020-06-04 NOTE — Telephone Encounter (Signed)
Received a new hem referral from Dr. Chalmers Cater for anemia. Pt has been cld and scheduled to see Dr. Lindi Adie on 10/19 at 11am. Pt aware to arrive 20 minutes early.

## 2020-06-12 IMAGING — CR CHEST - 2 VIEW
2 series · 2 of 2 positions shown · non-contrast
Comparison: 08/22/2011

CLINICAL DATA: Chest pain, shortness of breath

EXAM:
CHEST - 2 VIEW

[chest pa]
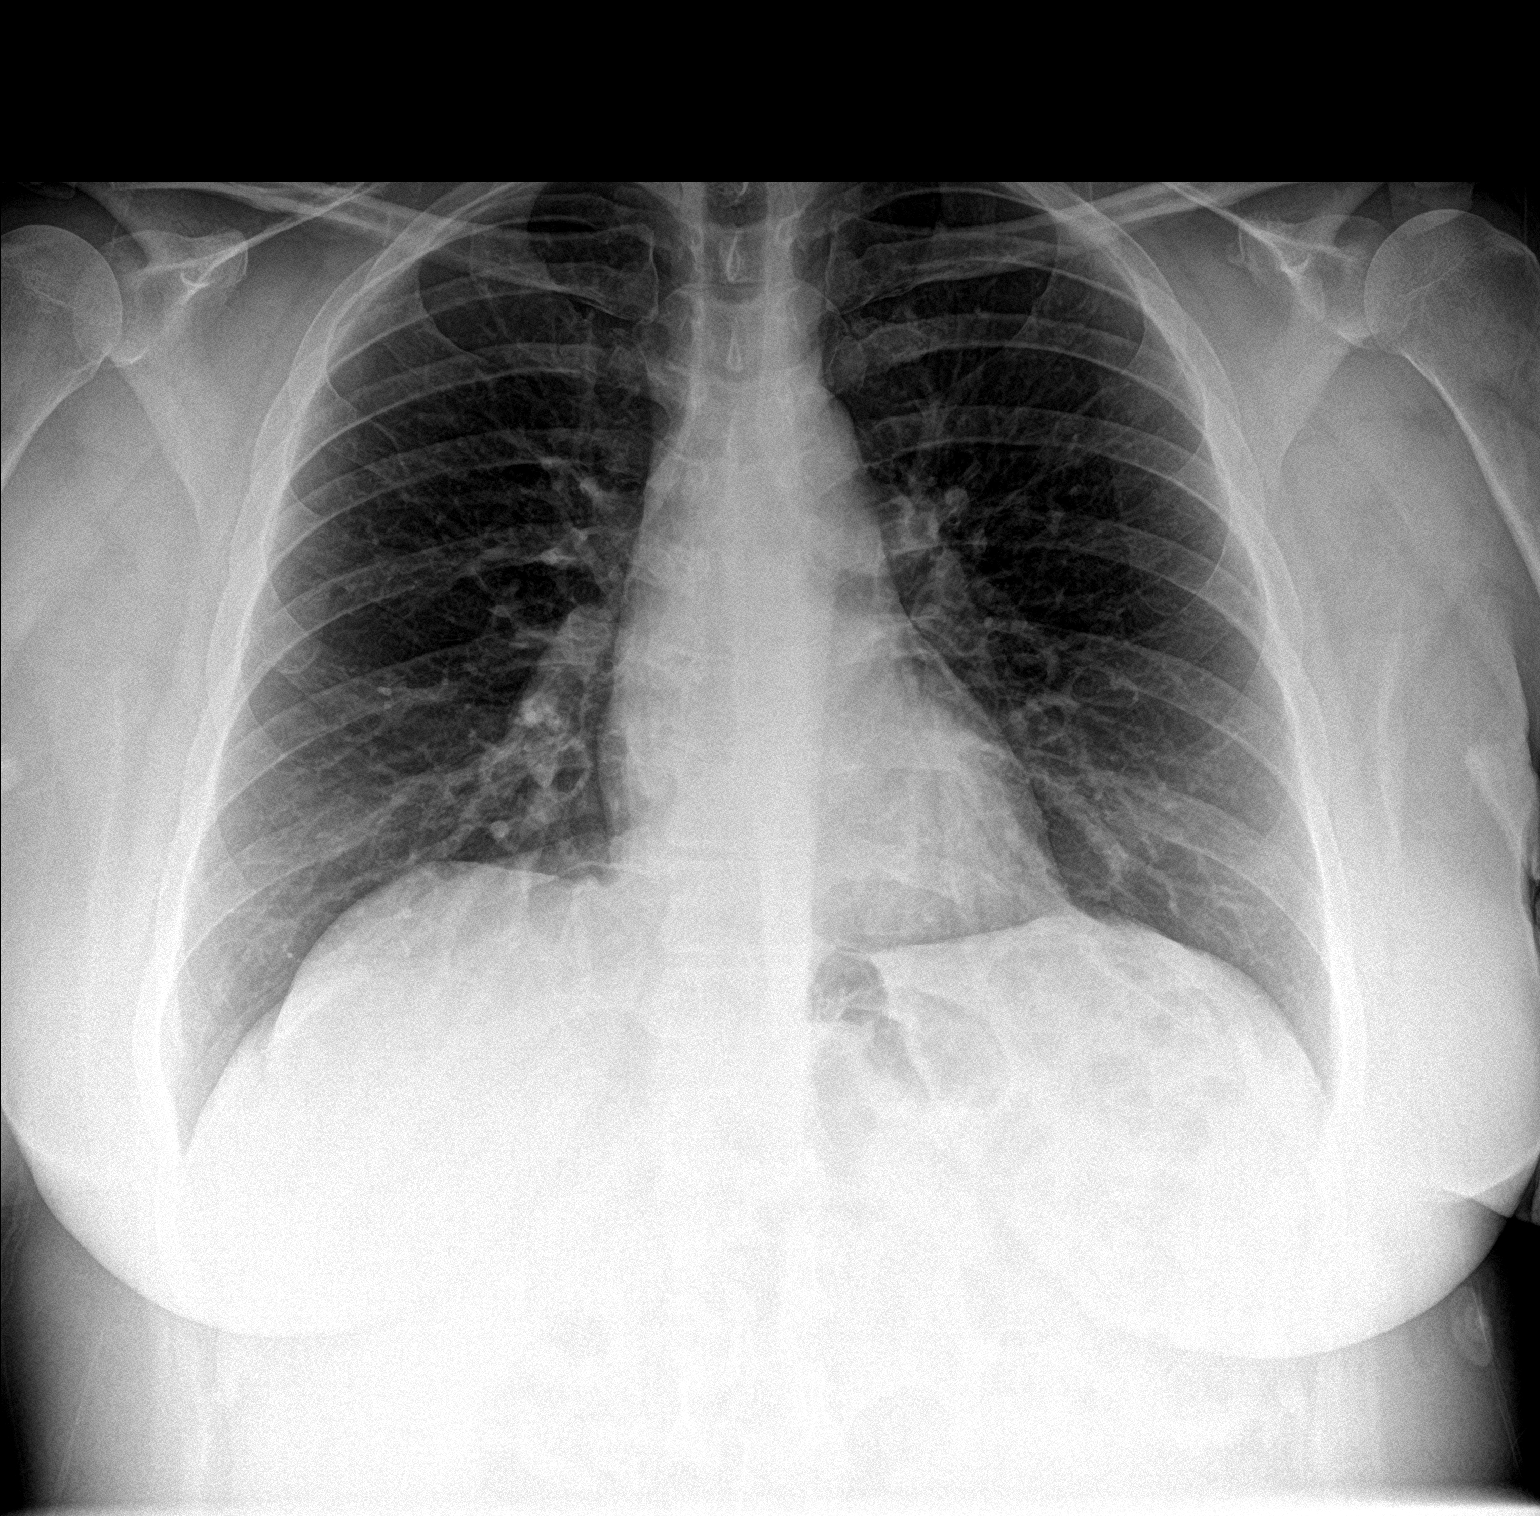

[chest lat]
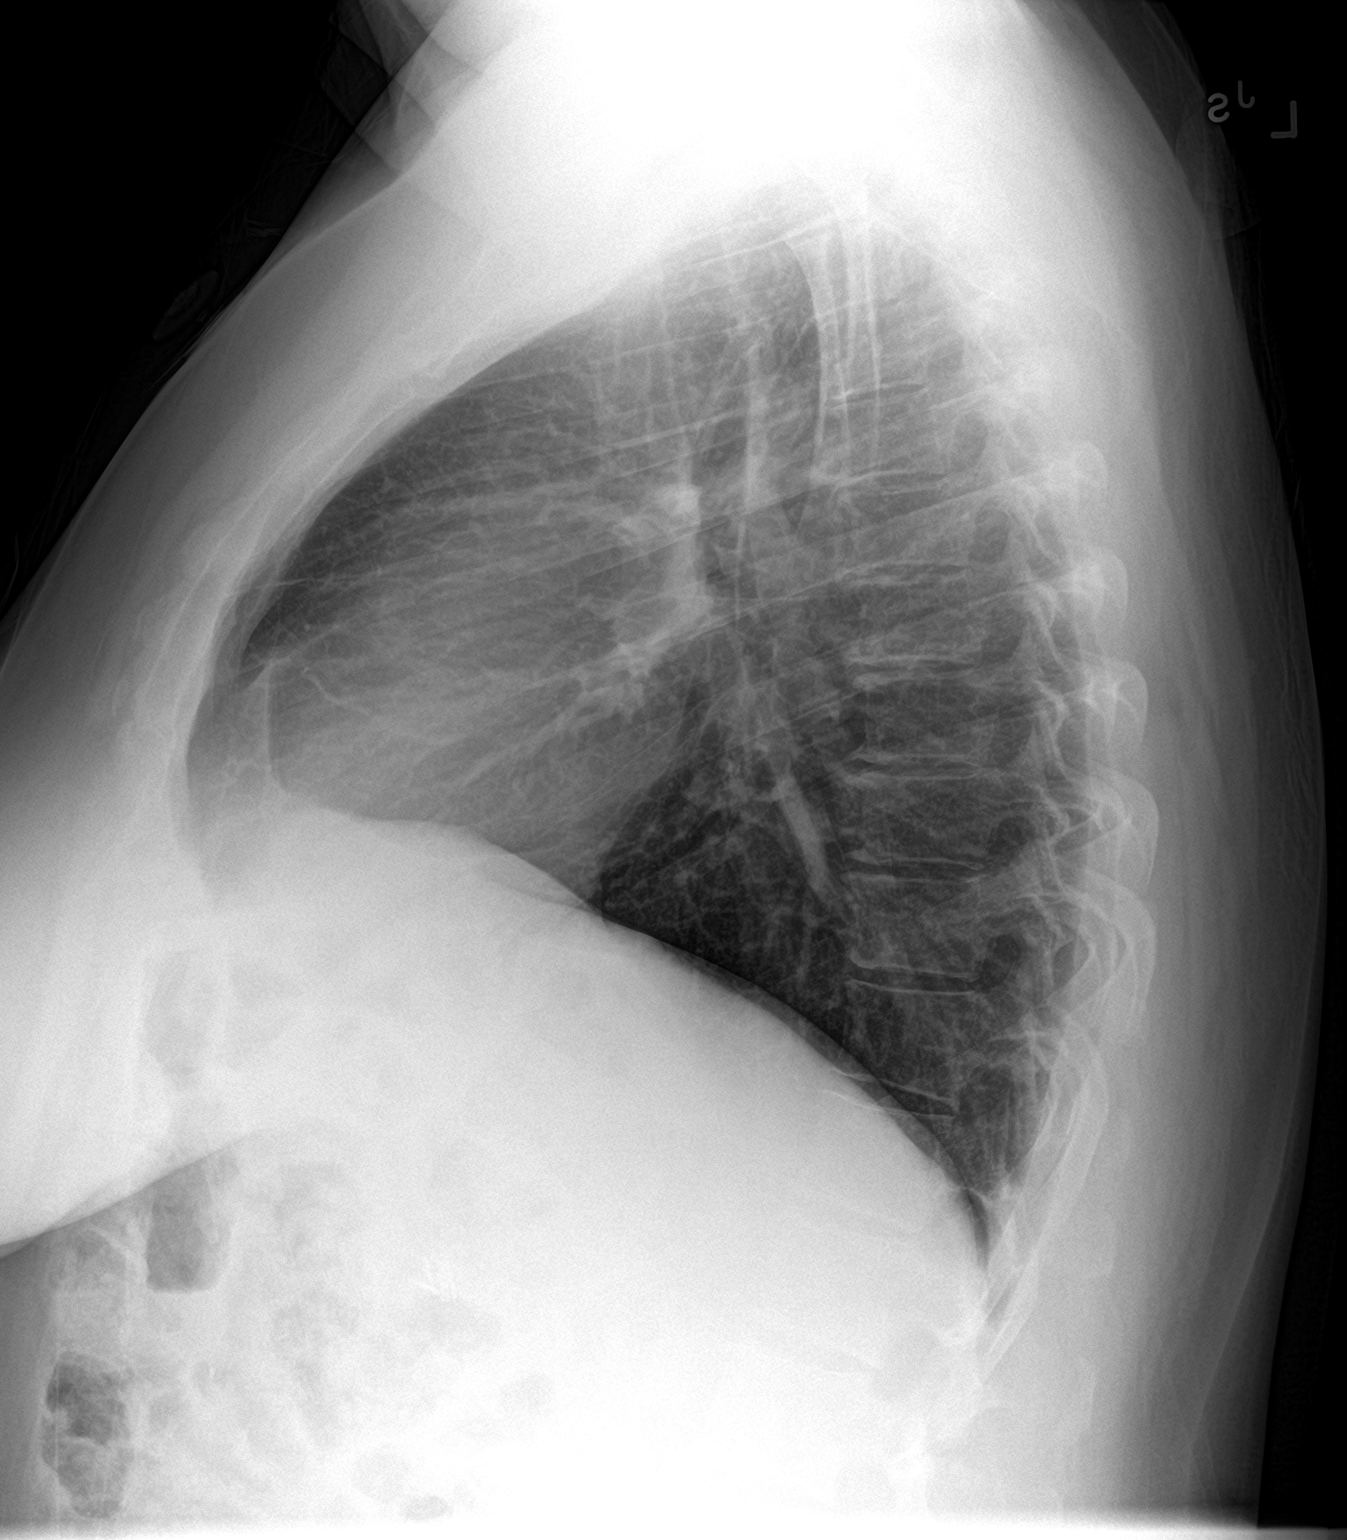

[2 of 2 positions shown; findings below may reference images not displayed]

FINDINGS: The heart size and mediastinal contours are within normal limits.
Both lungs are clear. The visualized skeletal structures are
unremarkable.
IMPRESSION: No acute abnormality of the lungs.  No focal airspace opacity.

## 2020-06-14 NOTE — Progress Notes (Signed)
[facility] CONSULT NOTE  Patient Care Team: Redmond School, MD as PCP - General (Internal Medicine)  CHIEF COMPLAINTS/PURPOSE OF CONSULTATION:  Newly diagnosed anemia  HISTORY OF PRESENTING ILLNESS:  [name] 44 y.o. female is here because of recent diagnosis of anemia. She is referred by Dr. Chalmers Cater. Labs on 05/27/20 showed: Hg 8.5, HCT 30.7, platelets 509, creatinine 0.6, TSH 0.79, B-12 1739. She presents to the clinic today for initial evaluation.  She had gastric bypass surgery in 2015.  In June she started feeling severe fatigue and was found to be anemic.  She waited a little while and when symptoms got worse she went back to her primary care physician.  They obtained CBC which showed hemoglobin of 8.5 with an MCV of 66.5.  She was referred to [place] for discussion regarding intravenous iron therapy.  She was prescribed B12 sublingual and it appears that the B12 levels are excellent.  She feels tired profoundly and short of breath exertion.  She has noticed small amount of rectal bleeding as well but this could be related to hemorrhoids. She cannot absorb nutrients because of her prior gastric bypass surgery.  I reviewed her records extensively and collaborated the history with the patient.   MEDICAL HISTORY:  Past Medical History:  Diagnosis Date  . Anemia    with iron infusions  . Anxiety    panic attacks  . Cancer [facility]) 2010   thyroidectomy  . Dysrhythmia    no meds currently- palpitations assoc with panic disorder or thyroid disease  . GERD (gastroesophageal reflux disease)   . Hypothyroidism   . PCOS (polycystic ovarian syndrome)   . PONV (postoperative nausea and vomiting)   . Thyroid disease     SURGICAL HISTORY: Past Surgical History:  Procedure Laterality Date  . bilateral arm lift surgery  2005  . CHOLECYSTECTOMY  11/01/2011   Procedure: LAPAROSCOPIC CHOLECYSTECTOMY;  Surgeon: Donato Heinz, MD;  Location: [facility];  Service: General;   Laterality: N/A;  . colonscopy    . DIAGNOSTIC LAPAROSCOPY  1994   x 2  . DILATION AND CURETTAGE OF UTERUS  10/12/2011   Procedure: DILATATION AND CURETTAGE;  Surgeon: Cyril Mourning, MD;  Location: [facility];  Service: Gynecology;  Laterality: N/A;  . DILATION AND CURETTAGE OF UTERUS N/A 08/19/2013   Procedure: DILATATION AND CURETTAGE;  Surgeon: Cyril Mourning, MD;  Location: [facility];  Service: Gynecology;  Laterality: N/A;  . 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 GASTRIC BYPASS    . OVARIAN CYST REMOVAL  '95  . removal of IUD    . right tube removed & ovarian cyst '11    . THYROIDECTOMY    . TONSILLECTOMY AND ADENOIDECTOMY    . TUBAL LIGATION N/A 03/06/2017   Procedure: POST PARTUM TUBAL LIGATION;  Surgeon: Dian Queen, MD;  Location: [facility];  Service: Gynecology;  Laterality: N/A;    SOCIAL HISTORY: Social History   Socioeconomic History  . Marital status: Married    Spouse name: Not on file  . Number of children: Not on file  . Years of education: Not on file  . Highest education level: Not on file  Occupational History  . Not on file  Tobacco Use  . Smoking status: Former Smoker    Packs/day: 1.00    Years: 5.00    Pack years: 5.00    Types: Cigarettes  Quit date: 01/26/2005    Years since quitting: 15.3  . Smokeless tobacco: Never Used  Substance and Sexual Activity  . Alcohol use: Not Currently  . Drug use: No  . Sexual activity: Yes    Birth control/protection: None  Other Topics Concern  . Not on file  Social History Narrative   Lives at home with husband, 2, 34, 34, 105, 1, 63, granddaughter.     Social Determinants of Health   Financial Resource Strain:   . Difficulty of Paying Living Expenses: Not on file  Food Insecurity:   . Worried About Charity fundraiser in the Last Year: Not on file  . Ran Out of Food in the Last Year: Not on file  Transportation Needs:   . Lack of Transportation  (Medical): Not on file  . Lack of Transportation (Non-Medical): Not on file  Physical Activity:   . Days of Exercise per Week: Not on file  . Minutes of Exercise per Session: Not on file  Stress:   . Feeling of Stress : Not on file  Social Connections:   . Frequency of Communication with Friends and Family: Not on file  . Frequency of Social Gatherings with Friends and Family: Not on file  . Attends Religious Services: Not on file  . Active Member of Clubs or Organizations: Not on file  . Attends Archivist Meetings: Not on file  . Marital Status: Not on file  Intimate Partner Violence:   . Fear of Current or Ex-Partner: Not on file  . Emotionally Abused: Not on file  . Physically Abused: Not on file  . Sexually Abused: Not on file    FAMILY HISTORY: Family History  Problem Relation Age of Onset  . Heart attack Father 74       Died age 42  . Hypertension Father   . Heart disease Father   . Diabetes Paternal Grandmother   . Heart disease Paternal Grandmother   . Breast cancer Paternal Grandmother   . Heart disease Paternal Grandfather   . Stroke Paternal Grandfather   . Bipolar disorder Maternal Grandmother   . Anxiety disorder Daughter   . Anesthesia problems Neg Hx   . Hypotension Neg Hx   . Malignant hyperthermia Neg Hx   . Pseudochol deficiency Neg Hx     ALLERGIES:  has No Known Allergies.  MEDICATIONS:  Current Outpatient Medications  Medication Sig Dispense Refill  . acetaminophen (TYLENOL) 500 MG tablet Take 500 mg by mouth every 6 (six) hours as needed for mild pain, moderate pain, fever or headache.     . ALPRAZolam (XANAX) 1 MG tablet Take 1 tablet (1 mg total) by mouth 2 (two) times daily as needed for anxiety or sleep. 60 tablet 5  . calcium citrate (CALCITRATE - DOSED IN MG ELEMENTAL CALCIUM) 950 MG tablet Take 200 mg of elemental calcium by mouth daily.    . Cholecalciferol (VITAMIN D3 PO) Take by mouth.    . levothyroxine (SYNTHROID) 300 MCG  tablet Take 300 mcg by mouth daily.     . Multiple Vitamin (MULTIVITAMIN) tablet Take 1 tablet by mouth daily.    . nitroGLYCERIN (NITROSTAT) 0.4 MG SL tablet Place 1 tablet (0.4 mg total) under the tongue every 5 (five) minutes as needed for chest pain. 30 tablet 0  . omeprazole (PRILOSEC) 40 MG capsule Take 40 mg by mouth daily as needed (for acid reflux).   3  . sertraline (ZOLOFT) 100 MG tablet Take  2 tablets (200 mg total) by mouth daily. 180 tablet 1  . topiramate (TOPAMAX) 25 MG tablet TAKE 1 TABLET TWICE A DAY AS TOLERATED 180 tablet 0  . UNABLE TO FIND [place] for RLS     No current facility-administered medications for this visit.    REVIEW OF SYSTEMS:   Fatigue, restless legs, pica, mild rectal bleeding, weight gain All other systems were reviewed with the patient and are negative.  PHYSICAL EXAMINATION: ECOG PERFORMANCE STATUS: 1 - Symptomatic but completely ambulatory  Vitals:   06/15/20 1111  BP: (!) 144/95  Pulse: (!) 117  Resp: 20  Temp: 98.3 F (36.8 C)  SpO2: 98%   Filed Weights   06/15/20 1111  Weight: 286 lb 3.2 oz (129.8 kg)      LABORATORY DATA:  I have reviewed the data as listed Lab Results  Component Value Date   WBC 7.6 01/27/2019   HGB 11.4 (L) 01/27/2019   HCT 37.5 01/27/2019   MCV 78.3 (L) 01/27/2019   PLT 390 01/27/2019   Lab Results  Component Value Date   NA 136 01/27/2019   K 4.1 01/27/2019   CL 102 01/27/2019   CO2 21 (L) 01/27/2019    RADIOGRAPHIC STUDIES: I have personally reviewed the radiological reports and agreed with the findings in the report.  ASSESSMENT AND PLAN:  Microcytic anemia Most likely related to iron deficiency as result of gastric bypass surgery causing malabsorption  Lab review: 01/27/2019: Hemoglobin 11.4, MCV 78.3, RDW 15.9 05/26/2020: Hemoglobin 8.5, MCV 66.5, platelets 519, creatinine 0.6, TSH 0.79, B12 1739 With severe microcytosis, I recommend checking iron levels today. If the iron deficiency is  confirmed then the plan of treatment is to give intravenous iron therapy. I recommended 3 doses of Venofer  I sent a message to Dr. Collene Mares to evaluate her for colonoscopy because she does have rectal bleeding.  She has seen Dr. Collene Mares in the past.  Because this may be related to malabsorption of iron as result of prior gastric bypass surgery, she might need IV iron periodically. Recheck labs and follow-up in 3 months with a MyChart virtual visit and labs to be done 2 days before the visit.   All questions were answered. The patient knows to call the clinic with any problems, questions or concerns.   Rulon Eisenmenger, MD, MPH 06/15/2020    I, Molly Dorshimer, am acting as scribe for Nicholas Lose, MD.  I have reviewed the above documentation for accuracy and completeness, and I agree with the above.

## 2020-06-15 ENCOUNTER — Inpatient Hospital Stay: Payer: 59 | Attending: Hematology and Oncology | Admitting: Hematology and Oncology

## 2020-06-15 ENCOUNTER — Inpatient Hospital Stay: Payer: 59

## 2020-06-15 ENCOUNTER — Other Ambulatory Visit: Payer: Self-pay

## 2020-06-15 DIAGNOSIS — Z87891 Personal history of nicotine dependence: Secondary | ICD-10-CM | POA: Insufficient documentation

## 2020-06-15 DIAGNOSIS — D509 Iron deficiency anemia, unspecified: Secondary | ICD-10-CM | POA: Insufficient documentation

## 2020-06-15 DIAGNOSIS — Z9884 Bariatric surgery status: Secondary | ICD-10-CM | POA: Diagnosis not present

## 2020-06-15 LAB — CBC WITH DIFFERENTIAL (CANCER CENTER ONLY)
Abs Immature Granulocytes: 0.04 10*3/uL (ref 0.00–0.07)
Basophils Absolute: 0.1 10*3/uL (ref 0.0–0.1)
Basophils Relative: 1 %
Eosinophils Absolute: 0.1 10*3/uL (ref 0.0–0.5)
Eosinophils Relative: 1 %
HCT: 35 % — ABNORMAL LOW (ref 36.0–46.0)
Hemoglobin: 9.6 g/dL — ABNORMAL LOW (ref 12.0–15.0)
Immature Granulocytes: 0 %
Lymphocytes Relative: 20 %
Lymphs Abs: 2.4 10*3/uL (ref 0.7–4.0)
MCH: 18.6 pg — ABNORMAL LOW (ref 26.0–34.0)
MCHC: 27.4 g/dL — ABNORMAL LOW (ref 30.0–36.0)
MCV: 68 fL — ABNORMAL LOW (ref 80.0–100.0)
Monocytes Absolute: 0.6 10*3/uL (ref 0.1–1.0)
Monocytes Relative: 5 %
Neutro Abs: 9.1 10*3/uL — ABNORMAL HIGH (ref 1.7–7.7)
Neutrophils Relative %: 73 %
Platelet Count: 485 10*3/uL — ABNORMAL HIGH (ref 150–400)
RBC: 5.15 MIL/uL — ABNORMAL HIGH (ref 3.87–5.11)
RDW: 20 % — ABNORMAL HIGH (ref 11.5–15.5)
WBC Count: 12.3 10*3/uL — ABNORMAL HIGH (ref 4.0–10.5)
nRBC: 0 % (ref 0.0–0.2)

## 2020-06-15 LAB — IRON AND TIBC
Iron: 24 ug/dL — ABNORMAL LOW (ref 41–142)
Saturation Ratios: 4 % — ABNORMAL LOW (ref 21–57)
TIBC: 548 ug/dL — ABNORMAL HIGH (ref 236–444)
UIBC: 523 ug/dL — ABNORMAL HIGH (ref 120–384)

## 2020-06-15 LAB — FERRITIN: Ferritin: 8 ng/mL — ABNORMAL LOW (ref 11–307)

## 2020-06-15 NOTE — Assessment & Plan Note (Signed)
Most likely related to iron deficiency as result of gastric bypass surgery causing malabsorption  Lab review: 01/27/2019: Hemoglobin 11.4, MCV 78.3, RDW 15.9 05/26/2020: Hemoglobin 8.5, MCV 66.5, platelets 519, creatinine 0.6, TSH 0.79, B12 1739 With severe microcytosis, I recommend checking iron levels today. If the iron deficiency is confirmed then the plan of treatment is to give intravenous iron therapy. I recommended 3 doses of Venofer  Because this may be related to malabsorption of iron as result of prior gastric bypass surgery, she might need IV iron periodically. Recheck labs and follow-up in 3 months.

## 2020-06-17 ENCOUNTER — Other Ambulatory Visit: Payer: Self-pay | Admitting: Psychiatry

## 2020-06-17 DIAGNOSIS — F4310 Post-traumatic stress disorder, unspecified: Secondary | ICD-10-CM

## 2020-06-17 DIAGNOSIS — F5081 Binge eating disorder: Secondary | ICD-10-CM

## 2020-06-25 ENCOUNTER — Inpatient Hospital Stay: Payer: 59

## 2020-06-25 ENCOUNTER — Other Ambulatory Visit: Payer: Self-pay

## 2020-06-25 VITALS — BP 122/78 | HR 92 | Temp 98.3°F | Resp 18

## 2020-06-25 DIAGNOSIS — D509 Iron deficiency anemia, unspecified: Secondary | ICD-10-CM

## 2020-06-25 MED ORDER — SODIUM CHLORIDE 0.9 % IV SOLN
Freq: Once | INTRAVENOUS | Status: AC
  Filled 2020-06-25: qty 250

## 2020-06-25 MED ORDER — SODIUM CHLORIDE 0.9 % IV SOLN
300.0000 mg | Freq: Once | INTRAVENOUS | Status: AC
  Administered 2020-06-25: 300 mg via INTRAVENOUS
  Filled 2020-06-25: qty 10

## 2020-06-25 NOTE — Patient Instructions (Signed)

## 2020-06-29 ENCOUNTER — Other Ambulatory Visit: Payer: Self-pay

## 2020-06-29 ENCOUNTER — Inpatient Hospital Stay: Payer: 59 | Attending: Hematology and Oncology

## 2020-06-29 VITALS — BP 121/76 | HR 83 | Temp 98.3°F | Resp 18

## 2020-06-29 DIAGNOSIS — D509 Iron deficiency anemia, unspecified: Secondary | ICD-10-CM | POA: Diagnosis present

## 2020-06-29 MED ORDER — SODIUM CHLORIDE 0.9 % IV SOLN
Freq: Once | INTRAVENOUS | Status: AC
  Filled 2020-06-29: qty 250

## 2020-06-29 MED ORDER — SODIUM CHLORIDE 0.9 % IV SOLN
300.0000 mg | Freq: Once | INTRAVENOUS | Status: AC
  Administered 2020-06-29: 300 mg via INTRAVENOUS
  Filled 2020-06-29: qty 10

## 2020-06-29 NOTE — Patient Instructions (Signed)

## 2020-07-01 ENCOUNTER — Other Ambulatory Visit: Payer: Self-pay | Admitting: Psychiatry

## 2020-07-01 DIAGNOSIS — F5081 Binge eating disorder: Secondary | ICD-10-CM

## 2020-07-01 DIAGNOSIS — F4310 Post-traumatic stress disorder, unspecified: Secondary | ICD-10-CM

## 2020-07-02 ENCOUNTER — Other Ambulatory Visit: Payer: Self-pay

## 2020-07-02 ENCOUNTER — Inpatient Hospital Stay: Payer: 59

## 2020-07-02 VITALS — BP 123/78 | HR 78 | Temp 98.5°F | Resp 18

## 2020-07-02 DIAGNOSIS — D509 Iron deficiency anemia, unspecified: Secondary | ICD-10-CM | POA: Diagnosis not present

## 2020-07-02 MED ORDER — SODIUM CHLORIDE 0.9 % IV SOLN
Freq: Once | INTRAVENOUS | Status: AC
  Filled 2020-07-02: qty 250

## 2020-07-02 MED ORDER — SODIUM CHLORIDE 0.9 % IV SOLN
300.0000 mg | Freq: Once | INTRAVENOUS | Status: AC
  Administered 2020-07-02: 300 mg via INTRAVENOUS
  Filled 2020-07-02: qty 10

## 2020-07-02 NOTE — Patient Instructions (Signed)

## 2020-08-13 ENCOUNTER — Other Ambulatory Visit: Payer: Self-pay | Admitting: Psychiatry

## 2020-08-13 DIAGNOSIS — F4010 Social phobia, unspecified: Secondary | ICD-10-CM

## 2020-08-13 DIAGNOSIS — F4310 Post-traumatic stress disorder, unspecified: Secondary | ICD-10-CM

## 2020-08-18 ENCOUNTER — Telehealth: Payer: Self-pay | Admitting: Hematology and Oncology

## 2020-08-18 NOTE — Telephone Encounter (Signed)
Left message to reschedule due to provider PAL.

## 2020-08-26 ENCOUNTER — Telehealth: Payer: Self-pay | Admitting: Hematology and Oncology

## 2020-08-26 NOTE — Telephone Encounter (Signed)
Reschedule per provider. Called pt and left a msg

## 2020-09-08 ENCOUNTER — Other Ambulatory Visit: Payer: Self-pay | Admitting: Psychiatry

## 2020-09-08 DIAGNOSIS — F4010 Social phobia, unspecified: Secondary | ICD-10-CM

## 2020-09-08 DIAGNOSIS — F4310 Post-traumatic stress disorder, unspecified: Secondary | ICD-10-CM

## 2020-09-08 DIAGNOSIS — F330 Major depressive disorder, recurrent, mild: Secondary | ICD-10-CM

## 2020-09-28 ENCOUNTER — Inpatient Hospital Stay: Payer: 59 | Attending: Hematology and Oncology

## 2020-09-28 ENCOUNTER — Other Ambulatory Visit: Payer: Self-pay

## 2020-09-28 DIAGNOSIS — Z23 Encounter for immunization: Secondary | ICD-10-CM | POA: Insufficient documentation

## 2020-09-28 DIAGNOSIS — D509 Iron deficiency anemia, unspecified: Secondary | ICD-10-CM | POA: Insufficient documentation

## 2020-09-28 DIAGNOSIS — Z79899 Other long term (current) drug therapy: Secondary | ICD-10-CM | POA: Insufficient documentation

## 2020-09-28 LAB — CBC WITH DIFFERENTIAL (CANCER CENTER ONLY)
Abs Immature Granulocytes: 0.02 10*3/uL (ref 0.00–0.07)
Basophils Absolute: 0.1 10*3/uL (ref 0.0–0.1)
Basophils Relative: 1 %
Eosinophils Absolute: 0.1 10*3/uL (ref 0.0–0.5)
Eosinophils Relative: 1 %
HCT: 40.6 % (ref 36.0–46.0)
Hemoglobin: 13.1 g/dL (ref 12.0–15.0)
Immature Granulocytes: 0 %
Lymphocytes Relative: 32 %
Lymphs Abs: 2.9 10*3/uL (ref 0.7–4.0)
MCH: 26.5 pg (ref 26.0–34.0)
MCHC: 32.3 g/dL (ref 30.0–36.0)
MCV: 82.2 fL (ref 80.0–100.0)
Monocytes Absolute: 0.6 10*3/uL (ref 0.1–1.0)
Monocytes Relative: 6 %
Neutro Abs: 5.4 10*3/uL (ref 1.7–7.7)
Neutrophils Relative %: 60 %
Platelet Count: 392 10*3/uL (ref 150–400)
RBC: 4.94 MIL/uL (ref 3.87–5.11)
RDW: 16.4 % — ABNORMAL HIGH (ref 11.5–15.5)
WBC Count: 9.1 10*3/uL (ref 4.0–10.5)
nRBC: 0 % (ref 0.0–0.2)

## 2020-09-28 LAB — IRON AND TIBC
Iron: 24 ug/dL — ABNORMAL LOW (ref 41–142)
Saturation Ratios: 6 % — ABNORMAL LOW (ref 21–57)
TIBC: 418 ug/dL (ref 236–444)
UIBC: 394 ug/dL — ABNORMAL HIGH (ref 120–384)

## 2020-09-28 LAB — FERRITIN: Ferritin: 9 ng/mL — ABNORMAL LOW (ref 11–307)

## 2020-09-30 ENCOUNTER — Telehealth: Payer: 59 | Admitting: Hematology and Oncology

## 2020-10-07 NOTE — Progress Notes (Signed)
  HEMATOLOGY-ONCOLOGY MYCHART VIDEO VISIT PROGRESS NOTE  I connected with [name] on 10/08/2020 at  8:30 AM EST by MyChart video conference and verified that I am speaking with the correct person using two identifiers.  I discussed the limitations, risks, security and privacy concerns of performing an evaluation and management service by MyChart and the availability of in person appointments.  I also discussed with the patient that there may be a patient responsible charge related to this service. The patient expressed understanding and agreed to proceed.  Patient's Location: Home Physician Location: Clinic  CHIEF COMPLIANT: Follow-up of anemia to review labs   INTERVAL HISTORY: [name] is a 45 y.o. female with above-mentioned history of anemia. Labs on 09/28/20 showed: Hg 13.1, HCT 40.6, platelets 392, iron saturation 6%, ferritin 9. She presents over MyChart today for follow-up. Starting to feel tired again. Restless legs better.  Observations/Objective:  There were no vitals filed for this visit. There is no height or weight on file to calculate BMI.  I have reviewed the data as listed CMP Latest Ref Rng & Units 01/27/2019 01/16/2017 12/23/2012  Glucose 70 - 99 mg/dL 101(H) 125(H) 92  BUN 6 - 20 mg/dL 16 9 11   Creatinine 0.44 - 1.00 mg/dL 0.71 0.42(L) 0.76  Sodium 135 - 145 mmol/L 136 134(L) 138  Potassium 3.5 - 5.1 mmol/L 4.1 3.5 4.1  Chloride 98 - 111 mmol/L 102 106 103  CO2 22 - 32 mmol/L 21(L) 19(L) 28  Calcium 8.9 - 10.3 mg/dL 8.9 8.5(L) 9.8  Total Protein 6.5 - 8.1 g/dL - 6.3(L) -  Total Bilirubin 0.3 - 1.2 mg/dL - 0.2(L) -  Alkaline Phos 38 - 126 U/L - 107 -  AST 15 - 41 U/L - 21 -  ALT 14 - 54 U/L - 13(L) -    Lab Results  Component Value Date   WBC 9.1 09/28/2020   HGB 13.1 09/28/2020   HCT 40.6 09/28/2020   MCV 82.2 09/28/2020   PLT 392 09/28/2020   NEUTROABS 5.4 09/28/2020      Assessment Plan:  Microcytic anemia Most likely related to  iron deficiency as result of gastric bypass surgery causing malabsorption  Lab review: 01/27/2019: Hemoglobin 11.4, MCV 78.3, RDW 15.9 05/26/2020: Hemoglobin 8.5, MCV 66.5, platelets 519, creatinine 0.6, TSH 0.79, B12 1739 09/28/20: Hb 13.1, MCV 82.2, Ferritin: 9, Iron Sat 6%, TIBC 394  IV Iron: Oct-Nov 2021  Recommendation: Additional IV Iron to fill up her reserves.  RTC in 4 months with labs and MD visit (MyChart visit 2 days after labs)  I discussed the assessment and treatment plan with the patient. The patient was provided an opportunity to ask questions and all were answered. The patient agreed with the plan and demonstrated an understanding of the instructions. The patient was advised to call back or seek an in-person evaluation if the symptoms worsen or if the condition fails to improve as anticipated.   Total time spent: 20 minutes including face-to-face MyChart video visit time and time spent for planning, charting and coordination of care  Rulon Eisenmenger, MD 10/08/2020   I, Cloyde Reams Dorshimer, am acting as scribe for Nicholas Lose, MD.  I have reviewed the above documentation for accuracy and completeness, and I agree with the above.

## 2020-10-07 NOTE — Assessment & Plan Note (Signed)
Most likely related to iron deficiency as result of gastric bypass surgery causing malabsorption  Lab review: 01/27/2019: Hemoglobin 11.4, MCV 78.3, RDW 15.9 05/26/2020: Hemoglobin 8.5, MCV 66.5, platelets 519, creatinine 0.6, TSH 0.79, B12 1739 09/28/20: Hb 13.1, MCV 82.2, Ferritin: 9, Iron Sat 6%, TIBC 394  IV Iron: Oct-Nov 2021  Recommendation: Additional IV Iron to fill up her reserves.  RTC in 4 months with labs and MD visit (MyChart visit 2 days after labs)

## 2020-10-08 ENCOUNTER — Inpatient Hospital Stay (HOSPITAL_BASED_OUTPATIENT_CLINIC_OR_DEPARTMENT_OTHER): Payer: 59 | Admitting: Hematology and Oncology

## 2020-10-08 DIAGNOSIS — D509 Iron deficiency anemia, unspecified: Secondary | ICD-10-CM | POA: Diagnosis not present

## 2020-10-11 ENCOUNTER — Telehealth: Payer: Self-pay | Admitting: Hematology and Oncology

## 2020-10-11 NOTE — Telephone Encounter (Signed)
Scheduled per 2/11 los. Called pt and left a msg

## 2020-10-15 ENCOUNTER — Inpatient Hospital Stay: Payer: 59

## 2020-10-15 ENCOUNTER — Other Ambulatory Visit: Payer: Self-pay

## 2020-10-15 VITALS — BP 104/65 | HR 74 | Temp 98.3°F | Resp 18

## 2020-10-15 DIAGNOSIS — D509 Iron deficiency anemia, unspecified: Secondary | ICD-10-CM

## 2020-10-15 MED ORDER — SODIUM CHLORIDE 0.9 % IV SOLN
Freq: Once | INTRAVENOUS | Status: AC
  Filled 2020-10-15: qty 250

## 2020-10-15 MED ORDER — INFLUENZA VAC SPLIT QUAD 0.5 ML IM SUSY
PREFILLED_SYRINGE | INTRAMUSCULAR | Status: AC
  Filled 2020-10-15: qty 0.5

## 2020-10-15 MED ORDER — INFLUENZA VAC SPLIT QUAD 0.5 ML IM SUSY
0.5000 mL | PREFILLED_SYRINGE | Freq: Once | INTRAMUSCULAR | Status: AC
  Administered 2020-10-15: 0.5 mL via INTRAMUSCULAR

## 2020-10-15 MED ORDER — SODIUM CHLORIDE 0.9 % IV SOLN
300.0000 mg | Freq: Once | INTRAVENOUS | Status: AC
  Administered 2020-10-15: 300 mg via INTRAVENOUS
  Filled 2020-10-15: qty 10

## 2020-10-15 NOTE — Patient Instructions (Addendum)
Iron Sucrose injection What is this medicine? IRON SUCROSE (AHY ern SOO krohs) is an iron complex. Iron is used to make healthy red blood cells, which carry oxygen and nutrients throughout the body. This medicine is used to treat iron deficiency anemia in people with chronic kidney disease. This medicine may be used for other purposes; ask your health care provider or pharmacist if you have questions. COMMON BRAND NAME(S): Venofer What should I tell my health care provider before I take this medicine? They need to know if you have any of these conditions:  anemia not caused by low iron levels  heart disease  high levels of iron in the blood  kidney disease  liver disease  an unusual or allergic reaction to iron, other medicines, foods, dyes, or preservatives  pregnant or trying to get pregnant  breast-feeding How should I use this medicine? This medicine is for infusion into a vein. It is given by a health care professional in a hospital or clinic setting. Talk to your pediatrician regarding the use of this medicine in children. While this drug may be prescribed for children as young as 2 years for selected conditions, precautions do apply. Overdosage: If you think you have taken too much of this medicine contact a poison control center or emergency room at once. NOTE: This medicine is only for you. Do not share this medicine with others. What if I miss a dose? It is important not to miss your dose. Call your doctor or health care professional if you are unable to keep an appointment. What may interact with this medicine? Do not take this medicine with any of the following medications:  deferoxamine  dimercaprol  other iron products This medicine may also interact with the following medications:  chloramphenicol  deferasirox This list may not describe all possible interactions. Give your health care provider a list of all the medicines, herbs, non-prescription drugs, or  dietary supplements you use. Also tell them if you smoke, drink alcohol, or use illegal drugs. Some items may interact with your medicine. What should I watch for while using this medicine? Visit your doctor or healthcare professional regularly. Tell your doctor or healthcare professional if your symptoms do not start to get better or if they get worse. You may need blood work done while you are taking this medicine. You may need to follow a special diet. Talk to your doctor. Foods that contain iron include: whole grains/cereals, dried fruits, beans, or peas, leafy green vegetables, and organ meats (liver, kidney). What side effects may I notice from receiving this medicine? Side effects that you should report to your doctor or health care professional as soon as possible:  allergic reactions like skin rash, itching or hives, swelling of the face, lips, or tongue  breathing problems  changes in blood pressure  cough  fast, irregular heartbeat  feeling faint or lightheaded, falls  fever or chills  flushing, sweating, or hot feelings  joint or muscle aches/pains  seizures  swelling of the ankles or feet  unusually weak or tired Side effects that usually do not require medical attention (report to your doctor or health care professional if they continue or are bothersome):  diarrhea  feeling achy  headache  irritation at site where injected  nausea, vomiting  stomach upset  tiredness This list may not describe all possible side effects. Call your doctor for medical advice about side effects. You may report side effects to FDA at 1-800-FDA-1088. Where should I keep   my medicine? This drug is given in a hospital or clinic and will not be stored at home. NOTE: This sheet is a summary. It may not cover all possible information. If you have questions about this medicine, talk to your doctor, pharmacist, or health care provider.  2021 Elsevier/Gold Standard (2011-05-25  17:14:35)  Influenza Virus Vaccine injection What is this medicine? INFLUENZA VIRUS VACCINE (in floo EN zuh VAHY ruhs vak SEEN) helps to reduce the risk of getting influenza also known as the flu. The vaccine only helps protect you against some strains of the flu. This medicine may be used for other purposes; ask your health care provider or pharmacist if you have questions. COMMON BRAND NAME(S): Afluria, Afluria Quadrivalent, Agriflu, Alfuria, FLUAD, FLUAD Quadrivalent, Fluarix, Fluarix Quadrivalent, Flublok, Flublok Quadrivalent, FLUCELVAX, FLUCELVAX Quadrivalent, Flulaval, Flulaval Quadrivalent, Fluvirin, Fluzone, Fluzone High-Dose, Fluzone Intradermal, Fluzone Quadrivalent What should I tell my health care provider before I take this medicine? They need to know if you have any of these conditions:  bleeding disorder like hemophilia  fever or infection  Guillain-Barre syndrome or other neurological problems  immune system problems  infection with the human immunodeficiency virus (HIV) or AIDS  low blood platelet counts  multiple sclerosis  an unusual or allergic reaction to influenza virus vaccine, latex, other medicines, foods, dyes, or preservatives. Different brands of vaccines contain different allergens. Some may contain latex or eggs. Talk to your doctor about your allergies to make sure that you get the right vaccine.  pregnant or trying to get pregnant  breast-feeding How should I use this medicine? This vaccine is for injection into a muscle or under the skin. It is given by a health care professional. A copy of Vaccine Information Statements will be given before each vaccination. Read this sheet carefully each time. The sheet may change frequently. Talk to your healthcare provider to see which vaccines are right for you. Some vaccines should not be used in all age groups. Overdosage: If you think you have taken too much of this medicine contact a poison control center or  emergency room at once. NOTE: This medicine is only for you. Do not share this medicine with others. What if I miss a dose? This does not apply. What may interact with this medicine?  chemotherapy or radiation therapy  medicines that lower your immune system like etanercept, anakinra, infliximab, and adalimumab  medicines that treat or prevent blood clots like warfarin  phenytoin  steroid medicines like prednisone or cortisone  theophylline  vaccines This list may not describe all possible interactions. Give your health care provider a list of all the medicines, herbs, non-prescription drugs, or dietary supplements you use. Also tell them if you smoke, drink alcohol, or use illegal drugs. Some items may interact with your medicine. What should I watch for while using this medicine? Report any side effects that do not go away within 3 days to your doctor or health care professional. Call your health care provider if any unusual symptoms occur within 6 weeks of receiving this vaccine. You may still catch the flu, but the illness is not usually as bad. You cannot get the flu from the vaccine. The vaccine will not protect against colds or other illnesses that may cause fever. The vaccine is needed every year. What side effects may I notice from receiving this medicine? Side effects that you should report to your doctor or health care professional as soon as possible:  allergic reactions like skin rash, itching or   hives, swelling of the face, lips, or tongue Side effects that usually do not require medical attention (report to your doctor or health care professional if they continue or are bothersome):  fever  headache  muscle aches and pains  pain, tenderness, redness, or swelling at the injection site  tiredness Side effects that you should report to your doctor or health care professional as soon as possible:  allergic reactions like skin rash, itching or hives, swelling of the  face, lips, or tongue Side effects that usually do not require medical attention (report to your doctor or health care professional if they continue or are bothersome):  fever  headache  muscle aches and pains  pain, tenderness, redness, or swelling at the injection site  tiredness This list may not describe all possible side effects. Call your doctor for medical advice about side effects. You may report side effects to FDA at 1-800-FDA-1088. Where should I keep my medicine? The vaccine will be given by a health care professional in a clinic, pharmacy, doctor's office, or other health care setting. You will not be given vaccine doses to store at home. NOTE: This sheet is a summary. It may not cover all possible information. If you have questions about this medicine, talk to your doctor, pharmacist, or health care provider.  2021 Elsevier/Gold Standard (2020-04-20 19:49:22)  

## 2020-10-18 ENCOUNTER — Inpatient Hospital Stay: Payer: 59

## 2020-10-18 ENCOUNTER — Other Ambulatory Visit: Payer: Self-pay

## 2020-10-18 VITALS — BP 118/82 | HR 65 | Temp 98.2°F | Resp 18

## 2020-10-18 DIAGNOSIS — D509 Iron deficiency anemia, unspecified: Secondary | ICD-10-CM

## 2020-10-18 MED ORDER — SODIUM CHLORIDE 0.9 % IV SOLN
300.0000 mg | Freq: Once | INTRAVENOUS | Status: AC
  Administered 2020-10-18: 300 mg via INTRAVENOUS
  Filled 2020-10-18: qty 10

## 2020-10-18 MED ORDER — SODIUM CHLORIDE 0.9 % IV SOLN
Freq: Once | INTRAVENOUS | Status: AC
  Filled 2020-10-18: qty 250

## 2020-10-18 NOTE — Patient Instructions (Signed)

## 2020-10-22 ENCOUNTER — Ambulatory Visit: Payer: 59

## 2020-10-22 ENCOUNTER — Inpatient Hospital Stay: Payer: 59

## 2020-10-22 ENCOUNTER — Other Ambulatory Visit: Payer: Self-pay

## 2020-10-22 VITALS — BP 102/64 | HR 79 | Temp 98.7°F | Resp 20

## 2020-10-22 DIAGNOSIS — D509 Iron deficiency anemia, unspecified: Secondary | ICD-10-CM | POA: Diagnosis not present

## 2020-10-22 MED ORDER — HEPARIN SOD (PORK) LOCK FLUSH 100 UNIT/ML IV SOLN
500.0000 [IU] | Freq: Once | INTRAVENOUS | Status: DC | PRN
  Filled 2020-10-22: qty 5

## 2020-10-22 MED ORDER — ALTEPLASE 2 MG IJ SOLR
2.0000 mg | Freq: Once | INTRAMUSCULAR | Status: DC | PRN
  Filled 2020-10-22: qty 2

## 2020-10-22 MED ORDER — SODIUM CHLORIDE 0.9% FLUSH
10.0000 mL | Freq: Once | INTRAVENOUS | Status: DC | PRN
  Filled 2020-10-22: qty 10

## 2020-10-22 MED ORDER — SODIUM CHLORIDE 0.9 % IV SOLN
Freq: Once | INTRAVENOUS | Status: AC
  Filled 2020-10-22: qty 250

## 2020-10-22 MED ORDER — SODIUM CHLORIDE 0.9% FLUSH
3.0000 mL | Freq: Once | INTRAVENOUS | Status: DC | PRN
  Filled 2020-10-22: qty 10

## 2020-10-22 MED ORDER — HEPARIN SOD (PORK) LOCK FLUSH 100 UNIT/ML IV SOLN
250.0000 [IU] | Freq: Once | INTRAVENOUS | Status: DC | PRN
  Filled 2020-10-22: qty 5

## 2020-10-22 MED ORDER — SODIUM CHLORIDE 0.9 % IV SOLN
300.0000 mg | Freq: Once | INTRAVENOUS | Status: AC
  Administered 2020-10-22: 300 mg via INTRAVENOUS
  Filled 2020-10-22: qty 10

## 2020-10-22 NOTE — Patient Instructions (Signed)

## 2020-10-26 ENCOUNTER — Encounter (HOSPITAL_COMMUNITY): Payer: Self-pay | Admitting: Emergency Medicine

## 2020-10-26 ENCOUNTER — Emergency Department (HOSPITAL_COMMUNITY): Payer: 59

## 2020-10-26 ENCOUNTER — Emergency Department (HOSPITAL_COMMUNITY)
Admission: EM | Admit: 2020-10-26 | Discharge: 2020-10-26 | Disposition: A | Discharge status: Home / Self Care | Payer: 59 | Attending: Emergency Medicine | Admitting: Emergency Medicine

## 2020-10-26 ENCOUNTER — Other Ambulatory Visit: Payer: Self-pay

## 2020-10-26 DIAGNOSIS — Z79899 Other long term (current) drug therapy: Secondary | ICD-10-CM | POA: Diagnosis not present

## 2020-10-26 DIAGNOSIS — Z87891 Personal history of nicotine dependence: Secondary | ICD-10-CM | POA: Insufficient documentation

## 2020-10-26 DIAGNOSIS — R072 Precordial pain: Secondary | ICD-10-CM | POA: Diagnosis present

## 2020-10-26 DIAGNOSIS — E039 Hypothyroidism, unspecified: Secondary | ICD-10-CM | POA: Diagnosis not present

## 2020-10-26 DIAGNOSIS — Z85850 Personal history of malignant neoplasm of thyroid: Secondary | ICD-10-CM | POA: Diagnosis not present

## 2020-10-26 DIAGNOSIS — R0602 Shortness of breath: Secondary | ICD-10-CM | POA: Insufficient documentation

## 2020-10-26 DIAGNOSIS — R079 Chest pain, unspecified: Secondary | ICD-10-CM

## 2020-10-26 LAB — BASIC METABOLIC PANEL
Anion gap: 10 (ref 5–15)
BUN: 17 mg/dL (ref 6–20)
CO2: 21 mmol/L — ABNORMAL LOW (ref 22–32)
Calcium: 9.3 mg/dL (ref 8.9–10.3)
Chloride: 106 mmol/L (ref 98–111)
Creatinine, Ser: 0.64 mg/dL (ref 0.44–1.00)
GFR, Estimated: >60 mL/min (ref >60)
Glucose, Bld: 88 mg/dL (ref 70–99)
Potassium: 4.5 mmol/L (ref 3.5–5.1)
Sodium: 137 mmol/L (ref 135–145)

## 2020-10-26 LAB — CBC
HCT: 41.2 % (ref 36.0–46.0)
Hemoglobin: 13.2 g/dL (ref 12.0–15.0)
MCH: 27.4 pg (ref 26.0–34.0)
MCHC: 32 g/dL (ref 30.0–36.0)
MCV: 85.5 fL (ref 80.0–100.0)
Platelets: 408 10*3/uL — ABNORMAL HIGH (ref 150–400)
RBC: 4.82 MIL/uL (ref 3.87–5.11)
RDW: 16.5 % — ABNORMAL HIGH (ref 11.5–15.5)
WBC: 10.5 10*3/uL (ref 4.0–10.5)
nRBC: 0 % (ref 0.0–0.2)

## 2020-10-26 LAB — I-STAT BETA HCG BLOOD, ED (MC, WL, AP ONLY): I-stat hCG, quantitative: <5 m[IU]/mL (ref <5)

## 2020-10-26 LAB — TROPONIN I (HIGH SENSITIVITY)
Troponin I (High Sensitivity): <2 ng/L (ref <18)
Troponin I (High Sensitivity): <2 ng/L (ref <18)

## 2020-10-26 MED ORDER — ALUM & MAG HYDROXIDE-SIMETH 200-200-20 MG/5ML PO SUSP
30.0000 mL | Freq: Once | ORAL | Status: AC
  Administered 2020-10-26: 30 mL via ORAL
  Filled 2020-10-26: qty 30

## 2020-10-26 NOTE — ED Provider Notes (Signed)
[facility] EMERGENCY DEPARTMENT Provider Note   CSN: [id number] Arrival date & time: 10/26/20  1508     History Chief Complaint  Patient presents with  . Chest Pain    [name] is a 45 y.o. female.  46 yo F with a chief complaints of chest pain.  This feels like a pressure and radiates to her back.  Been going on since yesterday afternoon.  She tried omeprazole without improvement and called her brother who is a physician and suggested she take some Tums.  Improved the pain but then reoccurred this morning.  Some shortness of breath off and on with it.  Not so much today is more yesterday.  Symptoms were worse yesterday but with concern came to the ED.  Denies cough congestion fever.  Denies trauma.  Denies nausea vomiting or diarrhea.  Had an episode of diaphoresis earlier today that resolved spontaneously.  Denies history of MI.  Denies history of hypertension hyperlipidemia diabetes or smoking.  Father had an MI in his late 69s.  She denies history of PE or DVT denies hemoptysis denies unilateral lower extremity edema denies recent surgery hospitalization or immobilization.  Denies history of cancer.  Denies estrogen use.  The history is provided by the patient.  Chest Pain Pain location:  Substernal area Pain quality: aching   Pain radiates to:  Does not radiate Pain severity:  Mild Onset quality:  Gradual Duration:  2 days Timing:  Constant Progression:  Worsening Chronicity:  New Relieved by:  Nothing Worsened by:  Nothing Ineffective treatments:  None tried Associated symptoms: shortness of breath   Associated symptoms: no dizziness, no fever, no headache, no nausea, no palpitations and no vomiting        Past Medical History:  Diagnosis Date  . Anemia    with iron infusions  . Anxiety    panic attacks  . Cancer [facility]) 2010   thyroidectomy  . Dysrhythmia    no meds currently- palpitations assoc with panic disorder or thyroid  disease  . GERD (gastroesophageal reflux disease)   . Hypothyroidism   . PCOS (polycystic ovarian syndrome)   . PONV (postoperative nausea and vomiting)   . Thyroid disease     Patient Active Problem List   Diagnosis Date Noted  . Microcytic anemia 06/15/2020  . Pregnancy 03/06/2017  . NSVD (normal spontaneous vaginal delivery) 03/06/2017  . Active labor at term 04/21/2015  . Gastric bypass status for obesity 03/18/2014  . Chest pain 09/18/2013  . Obesity, morbid, BMI 50 or higher ([place]) 09/18/2013  . CONSTIPATION 12/10/2009    Past Surgical History:  Procedure Laterality Date  . bilateral arm lift surgery  2005  . CHOLECYSTECTOMY  11/01/2011   Procedure: LAPAROSCOPIC CHOLECYSTECTOMY;  Surgeon: Donato Heinz, MD;  Location: [facility];  Service: General;  Laterality: N/A;  . colonscopy    . DIAGNOSTIC LAPAROSCOPY  1994   x 2  . DILATION AND CURETTAGE OF UTERUS  10/12/2011   Procedure: DILATATION AND CURETTAGE;  Surgeon: Cyril Mourning, MD;  Location: [facility];  Service: Gynecology;  Laterality: N/A;  . DILATION AND CURETTAGE OF UTERUS N/A 08/19/2013   Procedure: DILATATION AND CURETTAGE;  Surgeon: Cyril Mourning, MD;  Location: [facility];  Service: Gynecology;  Laterality: N/A;  . 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 GASTRIC BYPASS    . OVARIAN CYST REMOVAL  '95  .  removal of IUD    . right tube removed & ovarian cyst '11    . THYROIDECTOMY    . TONSILLECTOMY AND ADENOIDECTOMY    . TUBAL LIGATION N/A 03/06/2017   Procedure: POST PARTUM TUBAL LIGATION;  Surgeon: Dian Queen, MD;  Location: [facility];  Service: Gynecology;  Laterality: N/A;     OB History    Gravida  7   Para  5   Term  5   Preterm      AB  2   Living  5     SAB  1   IAB  1   Ectopic      Multiple  0   Live Births  5           Family History  Problem Relation Age of Onset  . Heart attack Father 7       Died age 58  . Hypertension  Father   . Heart disease Father   . Diabetes Paternal Grandmother   . Heart disease Paternal Grandmother   . Breast cancer Paternal Grandmother   . Heart disease Paternal Grandfather   . Stroke Paternal Grandfather   . Bipolar disorder Maternal Grandmother   . Anxiety disorder Daughter   . Anesthesia problems Neg Hx   . Hypotension Neg Hx   . Malignant hyperthermia Neg Hx   . Pseudochol deficiency Neg Hx     Social History   Tobacco Use  . Smoking status: Former Smoker    Packs/day: 1.00    Years: 5.00    Pack years: 5.00    Types: Cigarettes    Quit date: 01/26/2005    Years since quitting: 15.7  . Smokeless tobacco: Never Used  Substance Use Topics  . Alcohol use: Not Currently  . Drug use: No    Home Medications Prior to Admission medications   Medication Sig Start Date End Date Taking? Authorizing Provider  acetaminophen (TYLENOL) 500 MG tablet Take 500 mg by mouth every 6 (six) hours as needed for mild pain, moderate pain, fever or headache.     [provider]  ALPRAZolam Duanne Moron) 1 MG tablet TAKE 1 TABLET BY MOUTH 2 TIMES DAILY AS NEEDED FOR ANXIETY OR SLEEP. 08/13/20   Thayer Headings, PMHNP  butalbital-aspirin-caffeine [facility]) [phone] MG capsule Take 1 capsule by mouth 4 (four) times daily as needed. 05/20/20   [provider]  calcium citrate (CALCITRATE - DOSED IN MG ELEMENTAL CALCIUM) 950 MG tablet Take 200 mg of elemental calcium by mouth daily.    [provider]  Cholecalciferol (VITAMIN D3 PO) Take by mouth.    [provider]  levothyroxine (SYNTHROID) 300 MCG tablet Take 300 mcg by mouth daily.     [provider]  Multiple Vitamin (MULTIVITAMIN) tablet Take 1 tablet by mouth daily.    [provider]  nitroGLYCERIN (NITROSTAT) 0.4 MG SL tablet Place 1 tablet (0.4 mg total) under the tongue every 5 (five) minutes as needed for chest pain. 01/27/19   Darlin Drop P, PA-C  omeprazole (PRILOSEC) 40 MG  capsule Take 40 mg by mouth daily as needed (for acid reflux).     [provider]  sertraline (ZOLOFT) 100 MG tablet TAKE 2 TABLETS BY MOUTH EVERY DAY 09/08/20   Thayer Headings, PMHNP  topiramate (TOPAMAX) 25 MG tablet TAKE 1 TABLET TWICE A DAY AS TOLERATED 07/05/20   Thayer Headings, PMHNP  UNABLE TO FIND [place] for RLS    [provider]  Vitamin D, Ergocalciferol, (DRISDOL) 1.25 MG (50000 UNIT) CAPS capsule Take by mouth. 02/14/20   [provider]    Allergies    Patient has no known allergies.  Review of Systems   Review of Systems  Constitutional: Negative for chills and fever.  HENT: Negative for congestion and rhinorrhea.   Eyes: Negative for redness and visual disturbance.  Respiratory: Positive for shortness of breath. Negative for wheezing.   Cardiovascular: Positive for chest pain. Negative for palpitations.  Gastrointestinal: Negative for nausea and vomiting.  Genitourinary: Negative for dysuria and urgency.  Musculoskeletal: Negative for arthralgias and myalgias.  Skin: Negative for pallor and wound.  Neurological: Negative for dizziness and headaches.    Physical Exam Updated Vital Signs BP 110/66   Pulse 72   Temp 98.6 F (37 C) (Oral)   Resp 15   SpO2 100%   Physical Exam Vitals and nursing note reviewed.  Constitutional:      General: She is not in acute distress.    Appearance: She is well-developed and well-nourished. She is not diaphoretic.  HENT:     Head: Normocephalic and atraumatic.  Eyes:     Extraocular Movements: EOM normal.     Pupils: Pupils are equal, round, and reactive to light.  Cardiovascular:     Rate and Rhythm: Normal rate and regular rhythm.     Heart sounds: No murmur heard. No friction rub. No gallop.   Pulmonary:     Effort: Pulmonary effort is normal.     Breath sounds: No wheezing or rales.  Abdominal:     General: There is no distension.     Palpations: Abdomen is soft.     Tenderness: There  is no abdominal tenderness.  Musculoskeletal:        General: No tenderness or edema.     Cervical back: Normal range of motion and neck supple.  Skin:    General: Skin is warm and dry.  Neurological:     Mental Status: She is alert and oriented to person, place, and time.  Psychiatric:        Mood and Affect: Mood and affect normal.        Behavior: Behavior normal.     ED Results / Procedures / Treatments   Labs (all labs ordered are listed, but only abnormal results are displayed) Labs Reviewed  BASIC METABOLIC PANEL - Abnormal; Notable for the following components:      Result Value   CO2 21 (*)    All other components within normal limits  CBC - Abnormal; Notable for the following components:   RDW 16.5 (*)    Platelets 408 (*)    All other components within normal limits  I-STAT BETA HCG BLOOD, ED ([facility], [facility], [facility] ONLY)  TROPONIN I (HIGH SENSITIVITY)  TROPONIN I (HIGH SENSITIVITY)    EKG EKG Interpretation  Date/Time:  Tuesday October 26 2020 15:42:58 EST Ventricular Rate:  85 PR Interval:  178 QRS Duration: 78 QT Interval:  380 QTC Calculation: 452 R Axis:   20 Text Interpretation: Normal sinus rhythm Low voltage QRS Cannot rule out Anterior infarct , age undetermined Abnormal ECG No significant change since last tracing Confirmed by Deno Etienne [id number]) on 10/26/2020 5:01:02 PM   Radiology DG Chest 2 View  Result Date: 10/26/2020 CLINICAL DATA:  Chest pain for 2 days EXAM: CHEST - 2 VIEW COMPARISON:  02/11/2020 FINDINGS: The heart size and mediastinal contours are within normal limits. Both lungs are clear. The  visualized skeletal structures are unremarkable. IMPRESSION: No active cardiopulmonary disease. Electronically Signed   By: Inez Catalina M.D.   On: 10/26/2020 16:27    Procedures Procedures   Medications Ordered in ED Medications  alum & mag hydroxide-simeth (MAALOX/MYLANTA) 200-200-20 MG/5ML suspension 30 mL (30 mLs Oral Given 10/26/20 1741)    ED Course   I have reviewed the triage vital signs and the nursing notes.  Pertinent labs & imaging results that were available during my care of the patient were reviewed by me and considered in my medical decision making (see chart for details).    MDM Rules/Calculators/A&P                          45 yo F with a chief complaint of chest pain.  Atypical in nature.  EKG without concerning finding chest x-ray viewed by me without focal showed pneumothorax.  Patient has a significant family history with father having an MI in his late 23s.  Will obtain a delta troponin.  PERC negative.  Delta troponin negative.  Discharge home.  PCP follow-up.  8:12 PM:  I have discussed the diagnosis/risks/treatment options with the patient and believe the pt to be eligible for discharge home to follow-up with PCP. We also discussed returning to the ED immediately if new or worsening sx occur. We discussed the sx which are most concerning (e.g., sudden worsening pain, fever, inability to tolerate by mouth) that necessitate immediate return. Medications administered to the patient during their visit and any new prescriptions provided to the patient are listed below.  Medications given during this visit Medications  alum & mag hydroxide-simeth (MAALOX/MYLANTA) 200-200-20 MG/5ML suspension 30 mL (30 mLs Oral Given 10/26/20 1741)     The patient appears reasonably screen and/or stabilized for discharge and I doubt any other medical condition or other [facility] requiring further screening, evaluation, or treatment in the ED at this time prior to discharge.   Final Clinical Impression(s) / ED Diagnoses Final diagnoses:  Nonspecific chest pain    Rx / DC Orders ED Discharge Orders    None       Deno Etienne, DO 10/26/20 2012

## 2020-10-26 NOTE — Discharge Instructions (Signed)
Try pepcid or tagamet up to twice a day.  Try to avoid things that may make this worse, most commonly these are spicy foods tomato based products fatty foods chocolate and peppermint.  Alcohol and tobacco can also make this worse.  Return to the emergency department for sudden worsening pain fever or inability to eat or drink.  

## 2020-10-26 NOTE — ED Triage Notes (Signed)
Patient coming from home. Complaint of centralized chest pressure that began yesterday, patient states she thought it was acid reflux, took her omeprazole but had no relief.

## 2021-01-21 ENCOUNTER — Other Ambulatory Visit: Payer: Self-pay

## 2021-01-21 ENCOUNTER — Inpatient Hospital Stay: Payer: 59 | Attending: Hematology and Oncology

## 2021-01-21 DIAGNOSIS — D509 Iron deficiency anemia, unspecified: Secondary | ICD-10-CM | POA: Diagnosis present

## 2021-01-21 LAB — IRON AND TIBC
Iron: 47 ug/dL (ref 41–142)
Saturation Ratios: 14 % — ABNORMAL LOW (ref 21–57)
TIBC: 326 ug/dL (ref 236–444)
UIBC: 279 ug/dL (ref 120–384)

## 2021-01-21 LAB — CBC WITH DIFFERENTIAL (CANCER CENTER ONLY)
Abs Immature Granulocytes: 0.02 10*3/uL (ref 0.00–0.07)
Basophils Absolute: 0 10*3/uL (ref 0.0–0.1)
Basophils Relative: 0 %
Eosinophils Absolute: 0.1 10*3/uL (ref 0.0–0.5)
Eosinophils Relative: 1 %
HCT: 43 % (ref 36.0–46.0)
Hemoglobin: 14.3 g/dL (ref 12.0–15.0)
Immature Granulocytes: 0 %
Lymphocytes Relative: 27 %
Lymphs Abs: 2.2 10*3/uL (ref 0.7–4.0)
MCH: 28.4 pg (ref 26.0–34.0)
MCHC: 33.3 g/dL (ref 30.0–36.0)
MCV: 85.3 fL (ref 80.0–100.0)
Monocytes Absolute: 0.4 10*3/uL (ref 0.1–1.0)
Monocytes Relative: 5 %
Neutro Abs: 5.6 10*3/uL (ref 1.7–7.7)
Neutrophils Relative %: 67 %
Platelet Count: 323 10*3/uL (ref 150–400)
RBC: 5.04 MIL/uL (ref 3.87–5.11)
RDW: 15.4 % (ref 11.5–15.5)
WBC Count: 8.4 10*3/uL (ref 4.0–10.5)
nRBC: 0 % (ref 0.0–0.2)

## 2021-01-21 LAB — FERRITIN: Ferritin: 60 ng/mL (ref 11–307)

## 2021-01-24 NOTE — Progress Notes (Signed)
HEMATOLOGY-ONCOLOGY MYCHART VIDEO VISIT PROGRESS NOTE  I connected with Solon Palm on 01/25/2021 at  9:00 AM EDT by MyChart video conference and verified that I am speaking with the correct person using two identifiers.  I discussed the limitations, risks, security and privacy concerns of performing an evaluation and management service by MyChart and the availability of in person appointments.  I also discussed with the patient that there may be a patient responsible charge related to this service. The patient expressed understanding and agreed to proceed.  Patient's Location: Home Physician Location: Clinic  CHIEF COMPLIANT: Follow-up of anemia to review labs   INTERVAL HISTORY: CHIRSTINA Humphrey is a 45 y.o. female with above-mentioned history of anemia. Labs on 01/21/21 showed: Hg 14.3, HCT 43.0, platelets 323, iron saturation 14%, ferritin 60. She presents over MyChart today for follow-up.   She reports no major problems or concerns.  Does not have any symptoms of iron deficiency.  Observations/Objective:  There were no vitals filed for this visit. There is no height or weight on file to calculate BMI.  I have reviewed the data as listed CMP Latest Ref Rng & Units 10/26/2020 01/27/2019 01/16/2017  Glucose 70 - 99 mg/dL 88 101(H) 125(H)  BUN 6 - 20 mg/dL 17 16 9   Creatinine 0.44 - 1.00 mg/dL 0.64 0.71 0.42(L)  Sodium 135 - 145 mmol/L 137 136 134(L)  Potassium 3.5 - 5.1 mmol/L 4.5 4.1 3.5  Chloride 98 - 111 mmol/L 106 102 106  CO2 22 - 32 mmol/L 21(L) 21(L) 19(L)  Calcium 8.9 - 10.3 mg/dL 9.3 8.9 8.5(L)  Total Protein 6.5 - 8.1 g/dL - - 6.3(L)  Total Bilirubin 0.3 - 1.2 mg/dL - - 0.2(L)  Alkaline Phos 38 - 126 U/L - - 107  AST 15 - 41 U/L - - 21  ALT 14 - 54 U/L - - 13(L)    Lab Results  Component Value Date   WBC 8.4 01/21/2021   HGB 14.3 01/21/2021   HCT 43.0 01/21/2021   MCV 85.3 01/21/2021   PLT 323 01/21/2021   NEUTROABS 5.6 01/21/2021      Assessment Plan:   Microcytic anemia Most likely related to iron deficiency as result of gastric bypass surgery causing malabsorption  Lab review: 01/27/2019: Hemoglobin 11.4, MCV 78.3, RDW 15.9 05/26/2020: Hemoglobin 8.5, MCV 66.5, platelets 519, creatinine 0.6, TSH 0.79, B12 1739 09/28/20: Hb 13.1, MCV 82.2, Ferritin: 9, Iron Sat 6%, TIBC 394 01/21/2021: Hemoglobin 14.3, MCV 85, ferritin 60, iron saturation 14%  IV Iron: Oct-Nov 2021, February 2022 Patient feels very well without any problems or concerns.  Based on excellent blood work results, we can see her in 6 months with labs done ahead of time and follow-up with a MyChart virtual visit     I discussed the assessment and treatment plan with the patient. The patient was provided an opportunity to ask questions and all were answered. The patient agreed with the plan and demonstrated an understanding of the instructions. The patient was advised to call back or seek an in-person evaluation if the symptoms worsen or if the condition fails to improve as anticipated.   Total time spent: 20 minutes including face-to-face MyChart video visit time and time spent for planning, charting and coordination of care  Rulon Eisenmenger, MD 01/25/2021   I, Cloyde Reams Dorshimer, am acting as scribe for Nicholas Lose, MD.  I have reviewed the above documentation for accuracy and completeness, and I agree with the above.

## 2021-01-25 ENCOUNTER — Inpatient Hospital Stay (HOSPITAL_BASED_OUTPATIENT_CLINIC_OR_DEPARTMENT_OTHER): Payer: 59 | Admitting: Hematology and Oncology

## 2021-01-25 DIAGNOSIS — D509 Iron deficiency anemia, unspecified: Secondary | ICD-10-CM | POA: Diagnosis not present

## 2021-01-25 NOTE — Assessment & Plan Note (Signed)
Most likely related to iron deficiency as result of gastric bypass surgery causing malabsorption  Lab review: 01/27/2019: Hemoglobin 11.4, MCV 78.3, RDW 15.9 05/26/2020: Hemoglobin 8.5, MCV 66.5, platelets 519, creatinine 0.6, TSH 0.79, B12 1739 09/28/20: Hb 13.1, MCV 82.2, Ferritin: 9, Iron Sat 6%, TIBC 394 01/21/2021: Hemoglobin 14.3, MCV 85, ferritin 60, iron saturation 14%  IV Iron: Oct-Nov 2021, February 2022  Based on excellent blood work results, we can see her in 6 months with labs done ahead of time and follow-up with a MyChart virtual visit

## 2021-02-24 ENCOUNTER — Other Ambulatory Visit: Payer: Self-pay | Admitting: Psychiatry

## 2021-02-24 DIAGNOSIS — F4310 Post-traumatic stress disorder, unspecified: Secondary | ICD-10-CM

## 2021-02-24 DIAGNOSIS — F4010 Social phobia, unspecified: Secondary | ICD-10-CM

## 2021-02-25 ENCOUNTER — Encounter: Payer: Self-pay | Admitting: Hematology and Oncology

## 2021-02-25 NOTE — Telephone Encounter (Signed)
Please schedule appt

## 2021-02-25 NOTE — Telephone Encounter (Signed)
Pt has an appt on 8/9

## 2021-02-25 NOTE — Telephone Encounter (Signed)
Last filled 5/14

## 2021-03-25 ENCOUNTER — Encounter: Payer: Self-pay | Admitting: Hematology and Oncology

## 2021-03-28 ENCOUNTER — Encounter: Payer: Self-pay | Admitting: Psychiatry

## 2021-03-28 ENCOUNTER — Other Ambulatory Visit: Payer: Self-pay

## 2021-03-28 ENCOUNTER — Ambulatory Visit (INDEPENDENT_AMBULATORY_CARE_PROVIDER_SITE_OTHER): Payer: 59 | Admitting: Psychiatry

## 2021-03-28 DIAGNOSIS — F330 Major depressive disorder, recurrent, mild: Secondary | ICD-10-CM
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
## 2021-03-29 ENCOUNTER — Other Ambulatory Visit: Payer: Self-pay

## 2021-03-29 DIAGNOSIS — D509 Iron deficiency anemia, unspecified: Secondary | ICD-10-CM

## 2021-03-29 DIAGNOSIS — R5383 Other fatigue: Secondary | ICD-10-CM

## 2021-04-05 ENCOUNTER — Telehealth (INDEPENDENT_AMBULATORY_CARE_PROVIDER_SITE_OTHER): Payer: 59 | Admitting: Psychiatry

## 2021-04-05 ENCOUNTER — Encounter: Payer: Self-pay | Admitting: Psychiatry

## 2021-04-05 ENCOUNTER — Inpatient Hospital Stay: Payer: 59 | Attending: Hematology and Oncology

## 2021-04-05 ENCOUNTER — Other Ambulatory Visit: Payer: Self-pay

## 2021-04-05 DIAGNOSIS — F330 Major depressive disorder, recurrent, mild: Secondary | ICD-10-CM

## 2021-04-05 DIAGNOSIS — F4010 Social phobia, unspecified: Secondary | ICD-10-CM

## 2021-04-05 DIAGNOSIS — D509 Iron deficiency anemia, unspecified: Secondary | ICD-10-CM | POA: Insufficient documentation

## 2021-04-05 DIAGNOSIS — F4310 Post-traumatic stress disorder, unspecified: Secondary | ICD-10-CM | POA: Diagnosis not present

## 2021-04-05 DIAGNOSIS — R5383 Other fatigue: Secondary | ICD-10-CM

## 2021-04-05 LAB — IRON AND TIBC
Iron: 66 ug/dL (ref 41–142)
Saturation Ratios: 20 % — ABNORMAL LOW (ref 21–57)
TIBC: 329 ug/dL (ref 236–444)
UIBC: 263 ug/dL (ref 120–384)

## 2021-04-05 LAB — CBC WITH DIFFERENTIAL (CANCER CENTER ONLY)
Abs Immature Granulocytes: 0.01 10*3/uL (ref 0.00–0.07)
Basophils Absolute: 0 10*3/uL (ref 0.0–0.1)
Basophils Relative: 0 %
Eosinophils Absolute: 0.1 10*3/uL (ref 0.0–0.5)
Eosinophils Relative: 2 %
HCT: 42.2 % (ref 36.0–46.0)
Hemoglobin: 14.1 g/dL (ref 12.0–15.0)
Immature Granulocytes: 0 %
Lymphocytes Relative: 31 %
Lymphs Abs: 2.1 10*3/uL (ref 0.7–4.0)
MCH: 28.7 pg (ref 26.0–34.0)
MCHC: 33.4 g/dL (ref 30.0–36.0)
MCV: 85.9 fL (ref 80.0–100.0)
Monocytes Absolute: 0.4 10*3/uL (ref 0.1–1.0)
Monocytes Relative: 5 %
Neutro Abs: 4.2 10*3/uL (ref 1.7–7.7)
Neutrophils Relative %: 62 %
Platelet Count: 356 10*3/uL (ref 150–400)
RBC: 4.91 MIL/uL (ref 3.87–5.11)
RDW: 13.9 % (ref 11.5–15.5)
WBC Count: 6.7 10*3/uL (ref 4.0–10.5)
nRBC: 0 % (ref 0.0–0.2)

## 2021-04-05 LAB — FERRITIN: Ferritin: 53 ng/mL (ref 11–307)

## 2021-04-05 LAB — VITAMIN B12: Vitamin B-12: 249 pg/mL (ref 180–914)

## 2021-04-05 LAB — VITAMIN D 25 HYDROXY (VIT D DEFICIENCY, FRACTURES): Vit D, 25-Hydroxy: 27.6 ng/mL — ABNORMAL LOW (ref 30–100)

## 2021-04-05 LAB — TSH: TSH: 0.148 u[IU]/mL — ABNORMAL LOW (ref 0.308–3.960)

## 2021-04-05 MED ORDER — ALPRAZOLAM 1 MG PO TABS: ORAL_TABLET | ORAL | 5 refills | Status: DC

## 2021-04-05 MED ORDER — SERTRALINE HCL 100 MG PO TABS
200.0000 mg | ORAL_TABLET | Freq: Every day | ORAL | 1 refills | Status: DC
Start: 2021-04-05 — End: 2021-08-30

## 2021-04-05 NOTE — Progress Notes (Signed)
[name] [id number] [birth year] 45 y.o.  Virtual Visit via Video Note  I connected with pt @ on 04/05/21 at 10:45 AM EDT by a video enabled telemedicine application and verified that I am speaking with the correct person using two identifiers.   I discussed the limitations of evaluation and management by telemedicine and the availability of in person appointments. The patient expressed understanding and agreed to proceed.  I discussed the assessment and treatment plan with the patient. The patient was provided an opportunity to ask questions and all were answered. The patient agreed with the plan and demonstrated an understanding of the instructions.   The patient was advised to call back or seek an in-person evaluation if the symptoms worsen or if the condition fails to improve as anticipated.  I provided 15 minutes of non-face-to-face time during this encounter.  The patient was located at home.  The provider was located at [place].   Thayer Headings, PMHNP   Subjective:   Patient ID:  [name] is a 45 y.o. (DOB May 25, 1976) female.  Chief Complaint:  Chief Complaint  Patient presents with   Anxiety   Depression   Insomnia     HPI [name] presents for follow-up of anxiety, depression, and insomnia. She was last seen 01/14/20. She reports some recent marital stress. She reports, "I just don't care anymore." Reports that she continues to do what she needs to do to make quota at work. She reports that she has lost 44 lbs in the last several months. She reports "my weight issue is a really big factor" to her depression. She has been doing intermittent fasting and eating healthier foods. Anxiety "comes and goes." Occ panic attacks, typically when she is out of the house. Sometimes feels that people are looking at her in public. Has some panic with highway driving. She reports that her husband says she has "been more agitated lately." She  notices she is irritated with husband and not others. She reports some worry. She reports poor sleep and has difficulty getting up in the morning. Difficulty falling asleep. Sleeping falling asleep. Denies nightmares. Estimates sleeping 5 hours a night.   Has been having excessive fatigue and had iron infusions. Levels recently re-checked. Low motivation. She reports difficulty with focus. Occ passive death wishes. Denies SI.   She has re-started therapy with [name], [facility]. Reports that husband is now in therapy and is resistant to starting medication.   She continues to work from home. 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   Occ ETOH use, about 1-2 glasses of wine a week. She reports that when she has a glass of wine she has severe abdominal pain.  Children are doing ok.   Taking Xanax prn daily, typically twice a day. Xanax last filled 03/01/21.  Past Medication Trials: Buspar- HA's Wellbutrin XL- helped with weight loss in the past Sertraline- Effective Melatonin Gabapentin- not helpful for anxiety, RLS, or insomnia Propranolol Ambien Topamax- not helpful for headaches.     Review of Systems:  Review of Systems  Gastrointestinal:  Positive for abdominal pain and rectal pain.       Has endoscopy and colonoscopy scheduled for this week  Musculoskeletal:  Negative for gait problem.  Psychiatric/Behavioral:         Please refer to HPI   Denies any recent RLS.   Medications: I have reviewed the patient's current medications.  Current Outpatient Medications  Medication Sig Dispense Refill  aspirin-acetaminophen-caffeine (EXCEDRIN MIGRAINE) 250-250-65 MG tablet Take by mouth every 6 (six) hours as needed for headache.     butalbital-aspirin-caffeine (FIORINAL) 50-325-40 MG capsule Take 1 capsule by mouth 4 (four) times daily as needed.     calcium citrate (CALCITRATE - DOSED IN MG ELEMENTAL CALCIUM) 950 MG tablet Take 200 mg of elemental calcium by mouth daily.      Cholecalciferol (VITAMIN D3 PO) Take by mouth.     levothyroxine (SYNTHROID) 200 MCG tablet Take 200 mcg by mouth daily.     levothyroxine (SYNTHROID) 75 MCG tablet Take by mouth.     Multiple Vitamin (MULTIVITAMIN) tablet Take 1 tablet by mouth daily.     omeprazole (PRILOSEC) 40 MG capsule Take 40 mg by mouth daily as needed (for acid reflux).   3   phentermine 15 MG capsule Take 15 mg by mouth daily.     acetaminophen (TYLENOL) 500 MG tablet Take 500 mg by mouth every 6 (six) hours as needed for mild pain, moderate pain, fever or headache.      ALPRAZolam (XANAX) 1 MG tablet TAKE 1 TABLET BY MOUTH 2 TIMES DAILY AS NEEDED FOR ANXIETY OR SLEEP. 60 tablet 5   nitroGLYCERIN (NITROSTAT) 0.4 MG SL tablet Place 1 tablet (0.4 mg total) under the tongue every 5 (five) minutes as needed for chest pain. 30 tablet 0   sertraline (ZOLOFT) 100 MG tablet Take 2 tablets (200 mg total) by mouth daily. 180 tablet 1   Vitamin D, Ergocalciferol, (DRISDOL) 1.25 MG (50000 UNIT) CAPS capsule Take by mouth. (Patient not taking: Reported on 04/05/2021)     No current facility-administered medications for this visit.    Medication Side Effects: None  Allergies: No Known Allergies  Past Medical History:  Diagnosis Date   Anemia    with iron infusions   Anxiety    panic attacks   Cancer ([place]) 2010   thyroidectomy   Dysrhythmia    no meds currently- palpitations assoc with panic disorder or thyroid disease   GERD (gastroesophageal reflux disease)    Hypothyroidism    PCOS (polycystic ovarian syndrome)    PONV (postoperative nausea and vomiting)    Thyroid disease     Family History  Problem Relation Age of Onset   Heart attack Father 1       Died age 69   Hypertension Father    Heart disease Father    Diabetes Paternal Grandmother    Heart disease Paternal Grandmother    Breast cancer Paternal Grandmother    Heart disease Paternal Grandfather    Stroke Paternal Grandfather    Bipolar disorder  Maternal Grandmother    Anxiety disorder Daughter    Anesthesia problems Neg Hx    Hypotension Neg Hx    Malignant hyperthermia Neg Hx    Pseudochol deficiency Neg Hx     Social History   Socioeconomic History   Marital status: Married    Spouse name: Not on file   Number of children: Not on file   Years of education: Not on file   Highest education level: Not on file  Occupational History   Not on file  Tobacco Use   Smoking status: Former    Packs/day: 1.00    Years: 5.00    Pack years: 5.00    Types: Cigarettes    Quit date: 01/26/2005    Years since quitting: 16.2   Smokeless tobacco: Never  Substance and Sexual Activity   Alcohol use: Not Currently  Drug use: No   Sexual activity: Yes    Birth control/protection: None  Other Topics Concern   Not on file  Social History Narrative   Lives at home with husband, 2, 4, 7, 62, 88, 46, granddaughter.     Social Determinants of Health   Financial Resource Strain: Not on file  Food Insecurity: Not on file  Transportation Needs: Not on file  Physical Activity: Not on file  Stress: Not on file  Social Connections: Not on file  Intimate Partner Violence: Not on file    Past Medical History, Surgical history, Social history, and Family history were reviewed and updated as appropriate.   Please see review of systems for further details on the patient's review from today.   Objective:   Physical Exam:  There were no vitals taken for this visit.  Physical Exam Neurological:     Mental Status: She is alert and oriented to person, place, and time.     Cranial Nerves: No dysarthria.  Psychiatric:        Attention and Perception: Attention and perception normal.        Mood and Affect: Mood is anxious.        Speech: Speech normal.        Behavior: Behavior is cooperative.        Thought Content: Thought content normal. Thought content is not paranoid or delusional. Thought content does not include homicidal or  suicidal ideation. Thought content does not include homicidal or suicidal plan.        Cognition and Memory: Cognition and memory normal.        Judgment: Judgment normal.     Comments: Insight intact Dysphoric mood    Lab Review:     Component Value Date/Time   NA 137 10/26/2020 1556   K 4.5 10/26/2020 1556   CL 106 10/26/2020 1556   CO2 21 (L) 10/26/2020 1556   GLUCOSE 88 10/26/2020 1556   BUN 17 10/26/2020 1556   CREATININE 0.64 10/26/2020 1556   CALCIUM 9.3 10/26/2020 1556   PROT 6.3 (L) 01/16/2017 1150   ALBUMIN 2.5 (L) 01/16/2017 1150   AST 21 01/16/2017 1150   ALT 13 (L) 01/16/2017 1150   ALKPHOS 107 01/16/2017 1150   BILITOT 0.2 (L) 01/16/2017 1150   GFRNONAA >60 10/26/2020 1556   GFRAA >60 01/27/2019 1001       Component Value Date/Time   WBC 6.7 04/05/2021 0901   WBC 10.5 10/26/2020 1556   RBC 4.91 04/05/2021 0901   HGB 14.1 04/05/2021 0901   HCT 42.2 04/05/2021 0901   PLT 356 04/05/2021 0901   MCV 85.9 04/05/2021 0901   MCH 28.7 04/05/2021 0901   MCHC 33.4 04/05/2021 0901   RDW 13.9 04/05/2021 0901   LYMPHSABS 2.1 04/05/2021 0901   MONOABS 0.4 04/05/2021 0901   EOSABS 0.1 04/05/2021 0901   BASOSABS 0.0 04/05/2021 0901    No results found for: POCLITH, LITHIUM   No results found for: PHENYTOIN, PHENOBARB, VALPROATE, CBMZ   .res Assessment: Plan:   Discussed Phentermine may be triggering increased irritation.  She reports that at this time she feels that potential benefits outweigh potential risks and plans to continue phentermine. Discussed that there are medication options that may help improve her sleep.  She reports that she prefers not to add any additional medications at this time.  Advised patient to contact office if insomnia worsens and/or she prefers to start medication for insomnia. She reports that she  may try taking evening dose of Xanax closer to bedtime to improve insomnia. Continue Xanax 1 mg twice daily as needed for insomnia and  anxiety. Continue sertraline 200 mg daily for anxiety and depression. Agree with plan to resume therapy with [name], [facility]. Patient to follow-up with this provider in 6 months or sooner if clinically indicated. Patient advised to contact office with any questions, adverse effects, or acute worsening in signs and symptoms.    Cathalene was seen today for anxiety, depression and insomnia.  Diagnoses and all orders for this visit:  Social phobia -     sertraline (ZOLOFT) 100 MG tablet; Take 2 tablets (200 mg total) by mouth daily. -     ALPRAZolam (XANAX) 1 MG tablet; TAKE 1 TABLET BY MOUTH 2 TIMES DAILY AS NEEDED FOR ANXIETY OR SLEEP.  Posttraumatic stress disorder -     sertraline (ZOLOFT) 100 MG tablet; Take 2 tablets (200 mg total) by mouth daily. -     ALPRAZolam (XANAX) 1 MG tablet; TAKE 1 TABLET BY MOUTH 2 TIMES DAILY AS NEEDED FOR ANXIETY OR SLEEP.  Mild episode of recurrent major depressive disorder (HCC) -     sertraline (ZOLOFT) 100 MG tablet; Take 2 tablets (200 mg total) by mouth daily.    Please see After Visit Summary for patient specific instructions.  Future Appointments  Date Time Provider [place]  04/12/2021 10:00 AM [name], [facility] CP-CP None  05/25/2021  1:00 PM [name], [facility] CP-CP None  06/09/2021  4:00 PM [name], [facility] CP-CP None    No orders of the defined types were placed in this encounter.     -------------------------------

## 2021-04-12 ENCOUNTER — Ambulatory Visit (INDEPENDENT_AMBULATORY_CARE_PROVIDER_SITE_OTHER): Payer: 59 | Admitting: Psychiatry

## 2021-04-12 ENCOUNTER — Other Ambulatory Visit: Payer: Self-pay

## 2021-04-12 ENCOUNTER — Other Ambulatory Visit: Payer: 59

## 2021-04-12 DIAGNOSIS — F330 Major depressive disorder, recurrent, mild: Secondary | ICD-10-CM | POA: Diagnosis not present
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
## 2021-05-25 ENCOUNTER — Other Ambulatory Visit: Payer: Self-pay

## 2021-05-25 ENCOUNTER — Ambulatory Visit (INDEPENDENT_AMBULATORY_CARE_PROVIDER_SITE_OTHER): Payer: 59 | Admitting: Psychiatry

## 2021-05-25 DIAGNOSIS — F330 Major depressive disorder, recurrent, mild: Secondary | ICD-10-CM | POA: Diagnosis not present
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
## 2021-06-09 ENCOUNTER — Ambulatory Visit (INDEPENDENT_AMBULATORY_CARE_PROVIDER_SITE_OTHER): Payer: 59 | Admitting: Psychiatry

## 2021-06-09 DIAGNOSIS — F330 Major depressive disorder, recurrent, mild: Secondary | ICD-10-CM

## 2021-06-09 NOTE — Progress Notes (Signed)
    [facility] Counselor/Therapist Progress Note  Patient ID: [name], MRN: [id number],    Date: 06/09/2021  Time Spent: 49 minutes start time 4:02 PM end time 4:51 PM Virtual Visit via Video Note Connected with patient by a telemedicine/telehealth application, with their informed consent, and verified patient privacy and that I am speaking with the correct person using two identifiers. I discussed the limitations, risks, security and privacy concerns of performing psychotherapy and the availability of in person appointments. I also discussed with the patient that there may be a patient responsible charge related to this service. The patient expressed understanding and agreed to proceed. I discussed the treatment planning with the patient. The patient was provided an opportunity to ask questions and all were answered. The patient agreed with the plan and demonstrated an understanding of the instructions. The patient was advised to call  our office if  symptoms worsen or feel they are in a crisis state and need immediate contact.   Therapist Location: office Patient Location: home    Treatment Type: Individual Therapy  Reported Symptoms: anxiety, depression, triggered responses, with drawn, irritable  Mental Status Exam:  Appearance:   Well Groomed     Behavior:  Appropriate  Motor:  Normal  Speech/Language:   Normal Rate  Affect:  Appropriate  Mood:  labile  Thought process:  normal  Thought content:    WNL  Sensory/Perceptual disturbances:    WNL  Orientation:  oriented to person, place, time/date, and situation  Attention:  Good  Concentration:  Good  Memory:  WNL  Fund of knowledge:   Good  Insight:    Good  Judgment:   Good  Impulse Control:  Good   Risk Assessment: Danger to Self:  No Self-injurious Behavior: No Danger to Others: No Duty to Warn:no Physical Aggression / Violence:No  Access to Firearms a concern: No  Gang Involvement:No    Subjective: Met with patient via virtual session. She shared that her husband was talking about leaving because she is still reserved.  Patient discussed what she was feeling and how she does not want her husband to leave but she is trying to figure out how to have some time on her own.  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  Encouraged her to try and take 1 night a week as a date night 1 night to do something with some other girls and another day can be the family day where they do everything together as a family.  Patient was able to acknowledge she felt that that would be very helpful but the hard parts is when she seems to do something away her husband struggles as since her lots of messages which makes the experience not as enjoyable.  Since patient was at home and her husband was there he got on the visit for minutes to discuss what patient had shared with clinician.  Explained to him the importance of her figuring out who she is and having relationships again that she has lost so much since being at home and through COVID.  Also discussed them having a date night together so he can remind himself that is not she that she is wanting to leave the marriage is that she is trying to   find a different piece of her.  Encouraged him to find some things for himself as well as allowing patient to find some things for herself.  Husband did share that he has gotten triggered by her recent need for time alone because of his previous relationships not ending well.  Patient reported at the end of session she felt that the plan would be helpful and agreed to work on trying to find some connections with people she has not been in contact with for a long time and reestablish some of those social networks that were for  feeling for her.  Interventions: Solution-Oriented/Positive Psychology and Insight-Oriented  Diagnosis:   ICD-10-CM   1. Mild episode of recurrent major depressive disorder ([place])  F33.0       Plan: Patient is to use CBT and coping skills to decrease depression symptoms.  Patient and husband are to have a regular date night.  Patient is to spend time interacting with friends 1 night a week.  Patient is also to look into joining a gym or some sort of exercise class with other women she can connect with.  Patient is to take medication as directed. Long term goal:Establish an inward sense of self -worth, confidence, and competence Short term goal: Use positive self - talk messages to build self-esteem improve self acceptance and decrease guilt and shame  [name], [facility]

## 2021-08-25 ENCOUNTER — Encounter: Payer: Self-pay | Admitting: Hematology and Oncology

## 2021-08-28 ENCOUNTER — Other Ambulatory Visit: Payer: Self-pay | Admitting: Psychiatry

## 2021-08-28 DIAGNOSIS — F330 Major depressive disorder, recurrent, mild: Secondary | ICD-10-CM

## 2021-08-28 DIAGNOSIS — F4310 Post-traumatic stress disorder, unspecified: Secondary | ICD-10-CM

## 2021-08-28 DIAGNOSIS — F4010 Social phobia, unspecified: Secondary | ICD-10-CM

## 2021-09-05 ENCOUNTER — Encounter: Payer: Self-pay | Admitting: Hematology and Oncology

## 2021-09-05 ENCOUNTER — Other Ambulatory Visit: Payer: Self-pay

## 2021-09-05 ENCOUNTER — Ambulatory Visit (INDEPENDENT_AMBULATORY_CARE_PROVIDER_SITE_OTHER): Payer: BC Managed Care – PPO | Admitting: Psychiatry

## 2021-09-05 DIAGNOSIS — F330 Major depressive disorder, recurrent, mild: Secondary | ICD-10-CM
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
## 2021-09-19 DIAGNOSIS — H9191 Unspecified hearing loss, right ear: Secondary | ICD-10-CM | POA: Diagnosis not present

## 2021-09-19 DIAGNOSIS — H6983 Other specified disorders of Eustachian tube, bilateral: Secondary | ICD-10-CM | POA: Diagnosis not present

## 2021-09-19 DIAGNOSIS — H93291 Other abnormal auditory perceptions, right ear: Secondary | ICD-10-CM | POA: Diagnosis not present

## 2021-10-13 ENCOUNTER — Other Ambulatory Visit: Payer: Self-pay | Admitting: Psychiatry

## 2021-10-13 DIAGNOSIS — F4310 Post-traumatic stress disorder, unspecified: Secondary | ICD-10-CM

## 2021-10-13 DIAGNOSIS — F4010 Social phobia, unspecified: Secondary | ICD-10-CM

## 2021-10-17 ENCOUNTER — Telehealth: Payer: BC Managed Care – PPO | Admitting: Psychiatry

## 2021-10-26 ENCOUNTER — Ambulatory Visit: Payer: Self-pay | Admitting: Psychiatry

## 2021-10-28 ENCOUNTER — Telehealth: Payer: BC Managed Care – PPO | Admitting: Psychiatry

## 2021-11-06 ENCOUNTER — Other Ambulatory Visit: Payer: Self-pay | Admitting: Psychiatry

## 2021-11-06 DIAGNOSIS — F4010 Social phobia, unspecified: Secondary | ICD-10-CM

## 2021-11-06 DIAGNOSIS — F4310 Post-traumatic stress disorder, unspecified: Secondary | ICD-10-CM

## 2021-11-07 ENCOUNTER — Telehealth: Payer: BC Managed Care – PPO | Admitting: Psychiatry

## 2021-11-09 NOTE — Telephone Encounter (Signed)
Last filled 2/16, due 3/16.  ?

## 2021-11-17 ENCOUNTER — Ambulatory Visit (INDEPENDENT_AMBULATORY_CARE_PROVIDER_SITE_OTHER): Payer: BC Managed Care – PPO | Admitting: Psychiatry

## 2021-11-17 ENCOUNTER — Other Ambulatory Visit: Payer: Self-pay | Admitting: Psychiatry

## 2021-11-17 ENCOUNTER — Encounter: Payer: Self-pay | Admitting: Psychiatry

## 2021-11-17 ENCOUNTER — Other Ambulatory Visit: Payer: Self-pay

## 2021-11-17 DIAGNOSIS — F331 Major depressive disorder, recurrent, moderate: Secondary | ICD-10-CM | POA: Diagnosis not present

## 2021-11-17 DIAGNOSIS — F4310 Post-traumatic stress disorder, unspecified: Secondary | ICD-10-CM

## 2021-11-17 DIAGNOSIS — F4010 Social phobia, unspecified: Secondary | ICD-10-CM

## 2021-11-17 MED ORDER — BREXPIPRAZOLE 1 MG PO TABS: 1.0000 mg | ORAL_TABLET | Freq: Every day | ORAL | 0 refills | Status: DC

## 2021-11-17 MED ORDER — REXULTI 0.5 MG PO TABS: 0.5000 mg | ORAL_TABLET | Freq: Every day | ORAL | 0 refills | Status: DC

## 2021-11-17 MED ORDER — SERTRALINE HCL 100 MG PO TABS: 200.0000 mg | ORAL_TABLET | Freq: Every day | ORAL | 0 refills | Status: DC

## 2021-11-17 MED ORDER — ALPRAZOLAM 1 MG PO TABS: 1.0000 mg | ORAL_TABLET | Freq: Three times a day (TID) | ORAL | 1 refills | Status: DC | PRN

## 2021-11-17 NOTE — Progress Notes (Signed)
LEENA TIEDE ?[id number] ?[birth year] ?46 y.o. ? ?Subjective:  ? ?Patient ID:  [name] is a 46 y.o. (DOB January 26, 1976) female. ? ?Chief Complaint:  ?Chief Complaint  ?Patient presents with  ? Anxiety  ? Depression  ? ? ?Anxiety ? ? ? ?Depression ?       Associated symptoms include headaches.  Past medical history includes anxiety.   ?[name] presents to the office today for follow-up of anxiety and depression. She reports that her anxiety is "out the roof. She reports panic attacks when she does not have Xanax. Some anxious thoughts and worry with rumination. Occ GI s/s with anxiety. She reports that she no longer has intrusive thoughts since starting Sertraline. Some obsessive thoughts and compulsions to check things. The depression is bad." She reports tearful episodes. She reports that she has been very indecisive and having difficulty with concentration. She reports feeling persistent sad mood. She reports irritability with husband. Denies elevated mood. Sleep has been inconsistent. Energy is fair. Motivation has been low. Diminished interest in things. Anhedonia. She has lost almost 100 lbs intentionally in the last year. Denies impulsivity. Has had recent thoughts of wanting to leave her job of 15 years and that her attitude about her job has changed the last few weeks. She reports that she has had occasional suicidal thoughts without intent.  ? ?She reports that she and her husband have been having severe marital stress. She reports that she is not sure what she wants in terms of the marriage. She reports that her 46 yo has some behavioral issues and angry outbursts.  ? ?She asks about possible increase in Xanax. Reports taking it BID every day. She reports that Phentermine has not caused increased anxiety.  ? ?Past Medication Trials: ?Buspar- HA's ?Wellbutrin XL- helped with weight loss in the past ?Sertraline- Effective ?Melatonin ?Gabapentin- not helpful for anxiety, RLS, or  insomnia ?Propranolol ?Ambien ?Topamax- not helpful for headaches.  ?  ? ?[place] [facility] from 10/26/2020 in [place]  ?C-SSRS RISK CATEGORY No Risk  ? ?  ?  ? ?Review of Systems:  ?Review of Systems  ?Gastrointestinal: Negative.   ?Musculoskeletal:  Negative for gait problem.  ?Neurological:  Positive for headaches. Negative for tremors.  ?Psychiatric/Behavioral:  Positive for depression.   ?     Please refer to HPI  ? ?Medications: I have reviewed the patient's current medications. ? ?Current Outpatient Medications  ?Medication Sig Dispense Refill  ? Brexpiprazole (REXULTI) 0.5 MG TABS Take 1 tablet (0.5 mg total) by mouth daily. 7 tablet 0  ? [START ON 11/24/2021] brexpiprazole (REXULTI) 1 MG TABS tablet Take 1 tablet (1 mg total) by mouth daily. 28 tablet 0  ? butalbital-aspirin-caffeine (FIORINAL) 50-325-40 MG capsule Take 1 capsule by mouth 4 (four) times daily as needed.    ? calcium citrate (CALCITRATE - DOSED IN MG ELEMENTAL CALCIUM) 950 MG tablet Take 200 mg of elemental calcium by mouth daily.    ? Cholecalciferol (VITAMIN D3 PO) Take by mouth.    ? Levothyroxine Sodium 112 MCG CAPS Take 112 mcg by mouth 2 (two) times daily.    ? metFORMIN (GLUCOPHAGE) 500 MG tablet 1 tablet with a meal    ? Multiple Vitamin (MULTIVITAMIN) tablet Take 1 tablet by mouth daily.    ? omeprazole (PRILOSEC) 40 MG capsule Take 40 mg by mouth daily as needed (for acid reflux).   3  ? phentermine 15 MG capsule Take 15  mg by mouth daily.    ? topiramate (TOPAMAX) 100 MG tablet Take 100 mg by mouth daily.    ? ALPRAZolam (XANAX) 1 MG tablet Take 1 tablet (1 mg total) by mouth 3 (three) times daily as needed for anxiety. 90 tablet 1  ? sertraline (ZOLOFT) 100 MG tablet Take 2 tablets (200 mg total) by mouth daily. 180 tablet 0  ? ?No current facility-administered medications for this visit.  ? ? ?Medication Side Effects: None ? ?Allergies: No Known Allergies ? ?Past Medical History:  ?Diagnosis  Date  ? Anemia   ? with iron infusions  ? Anxiety   ? panic attacks  ? Cancer [facility]) 2010  ? thyroidectomy  ? Dysrhythmia   ? no meds currently- palpitations assoc with panic disorder or thyroid disease  ? GERD (gastroesophageal reflux disease)   ? Hypothyroidism   ? PCOS (polycystic ovarian syndrome)   ? PONV (postoperative nausea and vomiting)   ? Thyroid disease   ? ? ?Past Medical History, Surgical history, Social history, and Family history were reviewed and updated as appropriate.  ? ?Please see review of systems for further details on the patient's review from today.  ? ?Objective:  ? ?Physical Exam:  ?There were no vitals taken for this visit. ? ?Physical Exam ?Constitutional:   ?   General: She is not in acute distress. ?Musculoskeletal:     ?   General: No deformity.  ?Neurological:  ?   Mental Status: She is alert and oriented to person, place, and time.  ?   Coordination: Coordination normal.  ?Psychiatric:     ?   Attention and Perception: Attention and perception normal. She does not perceive auditory or visual hallucinations.     ?   Mood and Affect: Mood is anxious and depressed. Affect is not labile, blunt, angry or inappropriate.     ?   Speech: Speech normal.     ?   Behavior: Behavior normal.     ?   Thought Content: Thought content normal. Thought content is not paranoid or delusional. Thought content does not include homicidal or suicidal ideation. Thought content does not include homicidal or suicidal plan.     ?   Cognition and Memory: Cognition and memory normal.     ?   Judgment: Judgment normal.  ?   Comments: Insight intact  ? ? ?Lab Review:  ?   ?Component Value Date/Time  ? NA 137 10/26/2020 1556  ? K 4.5 10/26/2020 1556  ? CL 106 10/26/2020 1556  ? CO2 21 (L) 10/26/2020 1556  ? GLUCOSE 88 10/26/2020 1556  ? BUN 17 10/26/2020 1556  ? CREATININE 0.64 10/26/2020 1556  ? CALCIUM 9.3 10/26/2020 1556  ? PROT 6.3 (L) 01/16/2017 1150  ? ALBUMIN 2.5 (L) 01/16/2017 1150  ? AST 21 01/16/2017 1150   ? ALT 13 (L) 01/16/2017 1150  ? ALKPHOS 107 01/16/2017 1150  ? BILITOT 0.2 (L) 01/16/2017 1150  ? GFRNONAA >60 10/26/2020 1556  ? GFRAA >60 01/27/2019 1001  ? ? ?   ?Component Value Date/Time  ? WBC 6.7 04/05/2021 0901  ? WBC 10.5 10/26/2020 1556  ? RBC 4.91 04/05/2021 0901  ? HGB 14.1 04/05/2021 0901  ? HCT 42.2 04/05/2021 0901  ? PLT 356 04/05/2021 0901  ? MCV 85.9 04/05/2021 0901  ? MCH 28.7 04/05/2021 0901  ? MCHC 33.4 04/05/2021 0901  ? RDW 13.9 04/05/2021 0901  ? LYMPHSABS 2.1 04/05/2021 0901  ? MONOABS 0.4 04/05/2021  0901  ? EOSABS 0.1 04/05/2021 0901  ? BASOSABS 0.0 04/05/2021 0901  ? ? ?No results found for: POCLITH, LITHIUM  ? ?No results found for: PHENYTOIN, PHENOBARB, VALPROATE, CBMZ  ? ?.res ?Assessment: Plan:   ? ?Pt seen for 30 minutes and time spent discussing augmentation strategies to Sertraline to improve depression and anxiety. Discussed potential benefits, risks, and side effects of Rexulti. Discussed potential metabolic side effects associated with atypical antipsychotics, as well as potential risk for movement side effects. Advised pt to contact office if movement side effects occur. Pt agrees to trial of Rexulti. Will start Rexulti 0.5 mg po qd for one week, then increase to 1 mg daily for augmentation of depression.  ?Will increase Xanax to 1 mg po TID prn anxiety.  ?Continue Sertraline 200 mg po qd for anxiety and depression. ?Recommend continuing therapy with [name], [facility].  ?Pt to follow-up with this provider in 4 weeks or sooner if clinically indicated.  ?Patient advised to contact office with any questions, adverse effects, or acute worsening in signs and symptoms. ? ? ? ?Fantasy was seen today for anxiety and depression. ? ?Diagnoses and all orders for this visit: ? ?Moderate episode of recurrent major depressive disorder ([place]) ?-     Brexpiprazole (REXULTI) 0.5 MG TABS; Take 1 tablet (0.5 mg total) by mouth daily. ?-     brexpiprazole (REXULTI) 1 MG TABS tablet; Take 1 tablet  (1 mg total) by mouth daily. ?-     sertraline (ZOLOFT) 100 MG tablet; Take 2 tablets (200 mg total) by mouth daily. ? ?Social phobia ?-     sertraline (ZOLOFT) 100 MG tablet; Take 2 tablets (200 mg total

## 2021-11-18 NOTE — Telephone Encounter (Signed)
PA needed

## 2021-11-20 NOTE — Telephone Encounter (Signed)
Pt is only taking 0.5 mg for 7 days, can we get her samples instead? ?

## 2021-11-21 DIAGNOSIS — E669 Obesity, unspecified: Secondary | ICD-10-CM | POA: Diagnosis not present

## 2021-11-21 DIAGNOSIS — R300 Dysuria: Secondary | ICD-10-CM | POA: Diagnosis not present

## 2021-11-21 DIAGNOSIS — I10 Essential (primary) hypertension: Secondary | ICD-10-CM | POA: Diagnosis not present

## 2021-11-21 DIAGNOSIS — E063 Autoimmune thyroiditis: Secondary | ICD-10-CM | POA: Diagnosis not present

## 2021-11-21 DIAGNOSIS — Z6833 Body mass index (BMI) 33.0-33.9, adult: Secondary | ICD-10-CM | POA: Diagnosis not present

## 2021-11-22 NOTE — Telephone Encounter (Signed)
I have called all the numbers provided and mailboxes are full.  ?

## 2021-11-22 NOTE — Telephone Encounter (Signed)
Was able to reach patient and she will come to pick up samples. Pulled them.  ?

## 2021-12-12 ENCOUNTER — Ambulatory Visit: Payer: BC Managed Care – PPO | Admitting: Psychiatry

## 2021-12-29 ENCOUNTER — Telehealth: Payer: BC Managed Care – PPO | Admitting: Psychiatry

## 2022-01-23 ENCOUNTER — Telehealth: Payer: Self-pay | Admitting: Psychiatry

## 2022-01-23 DIAGNOSIS — F4310 Post-traumatic stress disorder, unspecified: Secondary | ICD-10-CM

## 2022-01-23 DIAGNOSIS — F4010 Social phobia, unspecified: Secondary | ICD-10-CM

## 2022-01-24 NOTE — Telephone Encounter (Signed)
One number mailbox was full, other number I LVM to call for appt

## 2022-01-24 NOTE — Telephone Encounter (Signed)
Received refill request for patient. Patient is overdue for follow-up. Please call to schedule follow-up appointment.  

## 2022-03-03 ENCOUNTER — Other Ambulatory Visit: Payer: Self-pay | Admitting: Psychiatry

## 2022-03-03 DIAGNOSIS — F4310 Post-traumatic stress disorder, unspecified: Secondary | ICD-10-CM

## 2022-03-03 DIAGNOSIS — F4010 Social phobia, unspecified: Secondary | ICD-10-CM

## 2022-03-03 DIAGNOSIS — F419 Anxiety disorder, unspecified: Secondary | ICD-10-CM | POA: Diagnosis not present

## 2022-03-03 DIAGNOSIS — I10 Essential (primary) hypertension: Secondary | ICD-10-CM | POA: Diagnosis not present

## 2022-03-03 DIAGNOSIS — E039 Hypothyroidism, unspecified: Secondary | ICD-10-CM | POA: Diagnosis not present

## 2022-03-03 DIAGNOSIS — F329 Major depressive disorder, single episode, unspecified: Secondary | ICD-10-CM | POA: Diagnosis not present

## 2022-03-08 DIAGNOSIS — Z124 Encounter for screening for malignant neoplasm of cervix: Secondary | ICD-10-CM | POA: Diagnosis not present

## 2022-03-08 DIAGNOSIS — Z6832 Body mass index (BMI) 32.0-32.9, adult: Secondary | ICD-10-CM | POA: Diagnosis not present

## 2022-03-08 DIAGNOSIS — Z01419 Encounter for gynecological examination (general) (routine) without abnormal findings: Secondary | ICD-10-CM | POA: Diagnosis not present

## 2022-04-25 DIAGNOSIS — J0140 Acute pansinusitis, unspecified: Secondary | ICD-10-CM | POA: Diagnosis not present

## 2022-04-25 DIAGNOSIS — R051 Acute cough: Secondary | ICD-10-CM | POA: Diagnosis not present

## 2022-06-03 ENCOUNTER — Emergency Department (HOSPITAL_COMMUNITY): Payer: Medicaid Other

## 2022-06-03 ENCOUNTER — Encounter (HOSPITAL_COMMUNITY): Payer: Self-pay

## 2022-06-03 ENCOUNTER — Emergency Department (HOSPITAL_COMMUNITY)
Admission: EM | Admit: 2022-06-03 | Discharge: 2022-06-03 | Disposition: A | Discharge status: Home / Self Care | Payer: Medicaid Other | Attending: Emergency Medicine | Admitting: Emergency Medicine

## 2022-06-03 DIAGNOSIS — K219 Gastro-esophageal reflux disease without esophagitis: Secondary | ICD-10-CM | POA: Insufficient documentation

## 2022-06-03 DIAGNOSIS — Z20822 Contact with and (suspected) exposure to covid-19: Secondary | ICD-10-CM | POA: Insufficient documentation

## 2022-06-03 DIAGNOSIS — N9489 Other specified conditions associated with female genital organs and menstrual cycle: Secondary | ICD-10-CM | POA: Insufficient documentation

## 2022-06-03 DIAGNOSIS — R61 Generalized hyperhidrosis: Secondary | ICD-10-CM | POA: Insufficient documentation

## 2022-06-03 DIAGNOSIS — R110 Nausea: Secondary | ICD-10-CM | POA: Insufficient documentation

## 2022-06-03 DIAGNOSIS — R0602 Shortness of breath: Secondary | ICD-10-CM | POA: Insufficient documentation

## 2022-06-03 DIAGNOSIS — R1013 Epigastric pain: Secondary | ICD-10-CM | POA: Insufficient documentation

## 2022-06-03 DIAGNOSIS — R0789 Other chest pain: Secondary | ICD-10-CM

## 2022-06-03 LAB — RESP PANEL BY RT-PCR (FLU A&B, COVID) ARPGX2
Influenza A by PCR: NEGATIVE
Influenza B by PCR: NEGATIVE
SARS Coronavirus 2 by RT PCR: NEGATIVE

## 2022-06-03 LAB — COMPREHENSIVE METABOLIC PANEL
ALT: 31 U/L (ref 0–44)
AST: 59 U/L — ABNORMAL HIGH (ref 15–41)
Albumin: 3.6 g/dL (ref 3.5–5.0)
Alkaline Phosphatase: 56 U/L (ref 38–126)
Anion gap: 7 (ref 5–15)
BUN: 17 mg/dL (ref 6–20)
CO2: 22 mmol/L (ref 22–32)
Calcium: 8.3 mg/dL — ABNORMAL LOW (ref 8.9–10.3)
Chloride: 108 mmol/L (ref 98–111)
Creatinine, Ser: 0.68 mg/dL (ref 0.44–1.00)
GFR, Estimated: >60 mL/min (ref >60)
Glucose, Bld: 88 mg/dL (ref 70–99)
Potassium: 3.5 mmol/L (ref 3.5–5.1)
Sodium: 137 mmol/L (ref 135–145)
Total Bilirubin: 0.5 mg/dL (ref 0.3–1.2)
Total Protein: 6.4 g/dL — ABNORMAL LOW (ref 6.5–8.1)

## 2022-06-03 LAB — CBC
HCT: 36.7 % (ref 36.0–46.0)
Hemoglobin: 11.6 g/dL — ABNORMAL LOW (ref 12.0–15.0)
MCH: 26.5 pg (ref 26.0–34.0)
MCHC: 31.6 g/dL (ref 30.0–36.0)
MCV: 83.8 fL (ref 80.0–100.0)
Platelets: 378 10*3/uL (ref 150–400)
RBC: 4.38 MIL/uL (ref 3.87–5.11)
RDW: 15.1 % (ref 11.5–15.5)
WBC: 7.9 10*3/uL (ref 4.0–10.5)
nRBC: 0 % (ref 0.0–0.2)

## 2022-06-03 LAB — TROPONIN I (HIGH SENSITIVITY)
Troponin I (High Sensitivity): <2 ng/L (ref <18)
Troponin I (High Sensitivity): 2 ng/L (ref <18)

## 2022-06-03 LAB — LIPASE, BLOOD: Lipase: 56 U/L — ABNORMAL HIGH (ref 11–51)

## 2022-06-03 LAB — I-STAT BETA HCG BLOOD, ED (MC, WL, AP ONLY): I-stat hCG, quantitative: <5 m[IU]/mL (ref <5)

## 2022-06-03 MED ORDER — LIDOCAINE VISCOUS HCL 2 % MT SOLN
15.0000 mL | Freq: Once | OROMUCOSAL | Status: AC
  Administered 2022-06-03: 15 mL via OROMUCOSAL
  Filled 2022-06-03: qty 15

## 2022-06-03 MED ORDER — ONDANSETRON HCL 4 MG/2ML IJ SOLN
4.0000 mg | Freq: Once | INTRAMUSCULAR | Status: AC
  Administered 2022-06-03: 4 mg via INTRAVENOUS
  Filled 2022-06-03: qty 2

## 2022-06-03 MED ORDER — FENTANYL CITRATE PF 50 MCG/ML IJ SOSY
50.0000 ug | PREFILLED_SYRINGE | Freq: Once | INTRAMUSCULAR | Status: AC
  Administered 2022-06-03: 50 ug via INTRAVENOUS
  Filled 2022-06-03: qty 1

## 2022-06-03 MED ORDER — LIDOCAINE VISCOUS HCL 2 % MT SOLN: OROMUCOSAL | 0 refills | Status: DC

## 2022-06-03 NOTE — ED Triage Notes (Signed)
Pt coming from home with [facility]. Pt reports feeling sick for a few days with a cough. She took some cough medicine this morning at 7 am and started having epigastric pain at 8 am that radiated to her left arm. She states she has an ulcer so she tried taking omeprazole with no relief. Pain increased and she reports breaking out in a sweat and called 911.   BP 114/70 HR 70 100% room air

## 2022-06-03 NOTE — ED Provider Notes (Signed)
[name] EMERGENCY DEPARTMENT Provider Note   CSN: [id number] Arrival date & time: 06/03/22  [id number]     History {Add pertinent medical, surgical, social history, OB history to HPI:1} Chief Complaint  Patient presents with  . Abdominal Pain    [name] is a 46 y.o. female who presents for the ED for " I feel like I am having a heart attack."  She has a past medical history of PCOS, morbid obesity status post gastric bypass, GERD.  She has had a recent "chest cold."  Patient states this morning around 7 AM she took some cold medication.  Between 7 AM and 8 AM she began developing pain in her epigastrium which then radiated into her retrosternal area, left shoulder and arm.  She states that the pain comes and goes, lasts a minute or 2.  She had associated nausea, diaphoresis and shortness of breath.  She denies a history of hypertension, hyperlipidemia or diabetes.  She does take metformin for her PCOS.  She does not smoke cigarettes.  She has a strong family history of CAD with her father having his first MI at the age of 64 and death from [place] in his 33s.  Patient states that she has had something like this before in the past which did not turn out to be ACS.  She has a cardiologist but cannot remember that doctor's name.  Abdominal Pain      Home Medications Prior to Admission medications   Medication Sig Start Date End Date Taking? Authorizing Provider  ALPRAZolam Duanne Moron) 1 MG tablet TAKE 1 TABLET BY MOUTH THREE TIMES A DAY AS NEEDED FOR ANXIETY 03/06/22   Thayer Headings, PMHNP  Brexpiprazole (REXULTI) 0.5 MG TABS Take 1 tablet (0.5 mg total) by mouth daily. 11/17/21   Thayer Headings, PMHNP  brexpiprazole (REXULTI) 1 MG TABS tablet Take 1 tablet (1 mg total) by mouth daily. 11/24/21   Thayer Headings, PMHNP  butalbital-aspirin-caffeine [facility]) [phone] MG capsule Take 1 capsule by mouth 4 (four) times daily as needed. 05/20/20   [provider]   calcium citrate (CALCITRATE - DOSED IN MG ELEMENTAL CALCIUM) 950 MG tablet Take 200 mg of elemental calcium by mouth daily.    [provider]  Cholecalciferol (VITAMIN D3 PO) Take by mouth.    [provider]  Levothyroxine Sodium 112 MCG CAPS Take 112 mcg by mouth 2 (two) times daily. 01/08/21   [provider]  metFORMIN (GLUCOPHAGE) 500 MG tablet 1 tablet with a meal    [provider]  Multiple Vitamin (MULTIVITAMIN) tablet Take 1 tablet by mouth daily.    [provider]  omeprazole (PRILOSEC) 40 MG capsule Take 40 mg by mouth daily as needed (for acid reflux).     [provider]  phentermine 15 MG capsule Take 15 mg by mouth daily. 03/22/21   [provider]  sertraline (ZOLOFT) 100 MG tablet Take 2 tablets (200 mg total) by mouth daily. 11/17/21   Thayer Headings, PMHNP  topiramate (TOPAMAX) 100 MG tablet Take 100 mg by mouth daily. 09/04/21   [provider]      Allergies    Patient has no known allergies.    Review of Systems   Review of Systems  Gastrointestinal:  Positive for abdominal pain.    Physical Exam Updated Vital Signs BP 111/75 (BP Location: Right Arm)   Pulse 68   Temp 97.6 F (36.4 C) (Oral)   Resp 18  SpO2 99%  Physical Exam Vitals and nursing note reviewed.  Constitutional:      General: She is not in acute distress.    Appearance: She is well-developed. She is not diaphoretic.  HENT:     Head: Normocephalic and atraumatic.     Right Ear: External ear normal.     Left Ear: External ear normal.     Nose: Nose normal.     Mouth/Throat:     Mouth: Mucous membranes are moist.  Eyes:     General: No scleral icterus.    Conjunctiva/sclera: Conjunctivae normal.  Cardiovascular:     Rate and Rhythm: Normal rate and regular rhythm.     Heart sounds: Normal heart sounds. No murmur heard.    No friction rub. No gallop.  Pulmonary:     Effort: Pulmonary effort is normal. No respiratory  distress.     Breath sounds: Normal breath sounds.  Abdominal:     General: Bowel sounds are normal. There is no distension.     Palpations: Abdomen is soft. There is no mass.     Tenderness: There is no abdominal tenderness. There is no guarding.  Musculoskeletal:     Cervical back: Normal range of motion.  Skin:    General: Skin is warm and dry.  Neurological:     Mental Status: She is alert and oriented to person, place, and time.  Psychiatric:        Behavior: Behavior normal.     ED Results / Procedures / Treatments   Labs (all labs ordered are listed, but only abnormal results are displayed) Labs Reviewed  CBC  COMPREHENSIVE METABOLIC PANEL  I-STAT BETA HCG BLOOD, ED ([facility], [facility], [facility] ONLY)  TROPONIN I (HIGH SENSITIVITY)    EKG None  Radiology No results found.  Procedures Procedures  {Document cardiac monitor, telemetry assessment procedure when appropriate:1}  Medications Ordered in ED Medications  lidocaine (XYLOCAINE) 2 % viscous mouth solution 15 mL (has no administration in time range)  fentaNYL (SUBLIMAZE) injection 50 mcg (has no administration in time range)  ondansetron (ZOFRAN) injection 4 mg (has no administration in time range)    ED Course/ Medical Decision Making/ A&P Clinical Course as of 06/03/22 0920  Sat Jun 03, 2022  0918 This patient presents to the ED for concern of epigastric /chest pain, this involves an extensive number of treatment options, and is a complaint that carries with it a high risk of complications and morbidity.  Differential diagnosis of epigastric pain includes: Functional or nonulcer dyspepsia, PUD, GERD, Gastritis, (NSAIDs, alcohol, stress, H. pylori, pernicious anemia), pancreatitis or pancreatic cancer, overeating indigestion (high-fat foods, coffee), drugs (aspirin, antibiotics (eg, macrolides, metronidazole), corticosteroids, digoxin, narcotics, theophylline), gastroparesis, lactose intolerance, malabsorption gastric cancer,  parasitic infection, (Giardia, Strongyloides, Ascaris) cholelithiasis, choledocholithiasis, or cholangitis, ACS, pericarditis, pneumonia, abdominal hernia, pregnancy, intestinal ischemia, esophageal rupture, gastric volvulus, hepatitis.   Co morbidities that complicate the patient evaluation      hx of Gastric bypass, GERD    [AH]    Clinical Course User Index [AH] Margarita Mail, PA-C                           Medical Decision Making Amount and/or Complexity of Data Reviewed Labs: ordered. Radiology: ordered.  Risk Prescription drug management.   ***  {Document critical care time when appropriate:1} {Document review of labs and clinical decision tools ie heart score, Chads2Vasc2 etc:1}  {Document your independent review of  radiology images, and any outside records:1} {Document your discussion with family members, caretakers, and with consultants:1} {Document social determinants of health affecting pt's care:1} {Document your decision making why or why not admission, treatments were needed:1} Final Clinical Impression(s) / ED Diagnoses Final diagnoses:  None    Rx / DC Orders ED Discharge Orders     None

## 2022-06-03 NOTE — Discharge Instructions (Signed)

## 2022-07-13 ENCOUNTER — Telehealth: Payer: Self-pay | Admitting: *Deleted

## 2022-07-13 DIAGNOSIS — D509 Iron deficiency anemia, unspecified: Secondary | ICD-10-CM

## 2022-07-13 NOTE — Telephone Encounter (Signed)
Received call from pt with complaint of increased fatigue and lower leg cramping.  Pt states symptoms develop when she needs IV iron transfusions. Pt requesting lab and MD appt for further evaluation.  RN sent high priority message to scheduling team.

## 2022-07-18 NOTE — Assessment & Plan Note (Signed)
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   Based on excellent blood work results, we can see her in 6 months with labs done ahead of time and follow-up with a MyChart virtual visit

## 2022-07-18 NOTE — Progress Notes (Signed)
Patient Care Team: Redmond School, MD as PCP - General (Internal Medicine)  DIAGNOSIS: No diagnosis found.  SUMMARY OF ONCOLOGIC HISTORY: Oncology History   No history exists.    CHIEF COMPLIANT:   INTERVAL HISTORY: [name] is a   ALLERGIES:  has No Known Allergies.  MEDICATIONS:  Current Outpatient Medications  Medication Sig Dispense Refill   ALPRAZolam (XANAX) 1 MG tablet TAKE 1 TABLET BY MOUTH THREE TIMES A DAY AS NEEDED FOR ANXIETY 90 tablet 0   Brexpiprazole (REXULTI) 0.5 MG TABS Take 1 tablet (0.5 mg total) by mouth daily. 7 tablet 0   brexpiprazole (REXULTI) 1 MG TABS tablet Take 1 tablet (1 mg total) by mouth daily. 28 tablet 0   butalbital-aspirin-caffeine (FIORINAL) 50-325-40 MG capsule Take 1 capsule by mouth 4 (four) times daily as needed.     calcium citrate (CALCITRATE - DOSED IN MG ELEMENTAL CALCIUM) 950 MG tablet Take 200 mg of elemental calcium by mouth daily.     Cholecalciferol (VITAMIN D3 PO) Take by mouth.     Levothyroxine Sodium 112 MCG CAPS Take 112 mcg by mouth 2 (two) times daily.     lidocaine (XYLOCAINE) 2 % solution Swallow 10 ml  every 6 hours as needed for abdominal pain 100 mL 0   metFORMIN (GLUCOPHAGE) 500 MG tablet 1 tablet with a meal     Multiple Vitamin (MULTIVITAMIN) tablet Take 1 tablet by mouth daily.     omeprazole (PRILOSEC) 40 MG capsule Take 40 mg by mouth daily as needed (for acid reflux).   3   phentermine 15 MG capsule Take 15 mg by mouth daily.     sertraline (ZOLOFT) 100 MG tablet Take 2 tablets (200 mg total) by mouth daily. 180 tablet 0   topiramate (TOPAMAX) 100 MG tablet Take 100 mg by mouth daily.     No current facility-administered medications for this visit.    PHYSICAL EXAMINATION: ECOG PERFORMANCE STATUS: {CHL ONC ECOG PS:[id number]}  There were no vitals filed for this visit. There were no vitals filed for this visit.  BREAST:*** No palpable masses or nodules in either right or left breasts. No  palpable axillary supraclavicular or infraclavicular adenopathy no breast tenderness or nipple discharge. (exam performed in the presence of a chaperone)  LABORATORY DATA:  I have reviewed the data as listed    Latest Ref Rng & Units 06/03/2022    9:30 AM 10/26/2020    3:56 PM 01/27/2019   10:01 AM  CMP  Glucose 70 - 99 mg/dL 88  88  101   BUN 6 - 20 mg/dL '17  17  16   '$ Creatinine 0.44 - 1.00 mg/dL 0.68  0.64  0.71   Sodium 135 - 145 mmol/L 137  137  136   Potassium 3.5 - 5.1 mmol/L 3.5  4.5  4.1   Chloride 98 - 111 mmol/L 108  106  102   CO2 22 - 32 mmol/L '22  21  21   '$ Calcium 8.9 - 10.3 mg/dL 8.3  9.3  8.9   Total Protein 6.5 - 8.1 g/dL 6.4     Total Bilirubin 0.3 - 1.2 mg/dL 0.5     Alkaline Phos 38 - 126 U/L 56     AST 15 - 41 U/L 59     ALT 0 - 44 U/L 31       Lab Results  Component Value Date   WBC 7.9 06/03/2022   HGB 11.6 (L) 06/03/2022  HCT 36.7 06/03/2022   MCV 83.8 06/03/2022   PLT 378 06/03/2022   NEUTROABS 4.2 04/05/2021    ASSESSMENT & PLAN:  No problem-specific Assessment & Plan notes found for this encounter.    No orders of the defined types were placed in this encounter.  The patient has a good understanding of the overall plan. she agrees with it. she will call with any problems that may develop before the next visit here. Total time spent: 30 mins including face to face time and time spent for planning, charting and co-ordination of care   Suzzette Righter, [place] 07/18/22    I Gardiner Coins am scribing for Dr. Lindi Adie  ***

## 2022-07-19 ENCOUNTER — Inpatient Hospital Stay: Payer: Self-pay | Attending: Hematology and Oncology

## 2022-07-19 ENCOUNTER — Inpatient Hospital Stay (HOSPITAL_BASED_OUTPATIENT_CLINIC_OR_DEPARTMENT_OTHER): Payer: Medicaid Other | Admitting: Hematology and Oncology

## 2022-07-19 ENCOUNTER — Other Ambulatory Visit: Payer: Medicaid Other

## 2022-07-19 ENCOUNTER — Other Ambulatory Visit: Payer: Self-pay

## 2022-07-19 ENCOUNTER — Ambulatory Visit: Payer: Medicaid Other | Admitting: Hematology and Oncology

## 2022-07-19 VITALS — BP 123/84 | HR 98 | Temp 97.7°F | Resp 18 | Ht 64.0 in | Wt 192.2 lb

## 2022-07-19 DIAGNOSIS — R5383 Other fatigue: Secondary | ICD-10-CM | POA: Insufficient documentation

## 2022-07-19 DIAGNOSIS — D509 Iron deficiency anemia, unspecified: Secondary | ICD-10-CM

## 2022-07-19 DIAGNOSIS — K909 Intestinal malabsorption, unspecified: Secondary | ICD-10-CM | POA: Insufficient documentation

## 2022-07-19 DIAGNOSIS — Z9884 Bariatric surgery status: Secondary | ICD-10-CM | POA: Insufficient documentation

## 2022-07-19 LAB — CMP (CANCER CENTER ONLY)
ALT: 16 U/L (ref 0–44)
AST: 16 U/L (ref 15–41)
Albumin: 4 g/dL (ref 3.5–5.0)
Alkaline Phosphatase: 50 U/L (ref 38–126)
Anion gap: 6 (ref 5–15)
BUN: 16 mg/dL (ref 6–20)
CO2: 21 mmol/L — ABNORMAL LOW (ref 22–32)
Calcium: 8.9 mg/dL (ref 8.9–10.3)
Chloride: 110 mmol/L (ref 98–111)
Creatinine: 0.54 mg/dL (ref 0.44–1.00)
GFR, Estimated: >60 mL/min (ref >60)
Glucose, Bld: 76 mg/dL (ref 70–99)
Potassium: 4.2 mmol/L (ref 3.5–5.1)
Sodium: 137 mmol/L (ref 135–145)
Total Bilirubin: 0.5 mg/dL (ref 0.3–1.2)
Total Protein: 6.9 g/dL (ref 6.5–8.1)

## 2022-07-19 LAB — CBC WITH DIFFERENTIAL (CANCER CENTER ONLY)
Abs Immature Granulocytes: 0.01 10*3/uL (ref 0.00–0.07)
Basophils Absolute: 0 10*3/uL (ref 0.0–0.1)
Basophils Relative: 1 %
Eosinophils Absolute: 0.1 10*3/uL (ref 0.0–0.5)
Eosinophils Relative: 1 %
HCT: 35 % — ABNORMAL LOW (ref 36.0–46.0)
Hemoglobin: 11.1 g/dL — ABNORMAL LOW (ref 12.0–15.0)
Immature Granulocytes: 0 %
Lymphocytes Relative: 39 %
Lymphs Abs: 2.4 10*3/uL (ref 0.7–4.0)
MCH: 25.5 pg — ABNORMAL LOW (ref 26.0–34.0)
MCHC: 31.7 g/dL (ref 30.0–36.0)
MCV: 80.3 fL (ref 80.0–100.0)
Monocytes Absolute: 0.3 10*3/uL (ref 0.1–1.0)
Monocytes Relative: 5 %
Neutro Abs: 3.4 10*3/uL (ref 1.7–7.7)
Neutrophils Relative %: 54 %
Platelet Count: 370 10*3/uL (ref 150–400)
RBC: 4.36 MIL/uL (ref 3.87–5.11)
RDW: 15.9 % — ABNORMAL HIGH (ref 11.5–15.5)
WBC Count: 6.2 10*3/uL (ref 4.0–10.5)
nRBC: 0 % (ref 0.0–0.2)

## 2022-07-19 LAB — IRON AND IRON BINDING CAPACITY (CC-WL,HP ONLY)
Iron: 55 ug/dL (ref 28–170)
Saturation Ratios: 11 % (ref 10.4–31.8)
TIBC: 504 ug/dL — ABNORMAL HIGH (ref 250–450)
UIBC: 449 ug/dL — ABNORMAL HIGH (ref 148–442)

## 2022-07-19 LAB — FERRITIN: Ferritin: 5 ng/mL — ABNORMAL LOW (ref 11–307)

## 2022-07-22 ENCOUNTER — Other Ambulatory Visit: Payer: Self-pay

## 2022-07-22 ENCOUNTER — Inpatient Hospital Stay: Payer: Medicaid Other

## 2022-07-22 VITALS — BP 113/73 | HR 82 | Temp 98.1°F | Resp 18

## 2022-07-22 DIAGNOSIS — D509 Iron deficiency anemia, unspecified: Secondary | ICD-10-CM

## 2022-07-22 MED ORDER — SODIUM CHLORIDE 0.9 % IV SOLN
300.0000 mg | Freq: Once | INTRAVENOUS | Status: AC
  Administered 2022-07-22: 300 mg via INTRAVENOUS
  Filled 2022-07-22: qty 15

## 2022-07-22 MED ORDER — SODIUM CHLORIDE 0.9 % IV SOLN: Freq: Once | INTRAVENOUS | Status: AC

## 2022-07-22 NOTE — Patient Instructions (Signed)

## 2022-07-22 NOTE — Progress Notes (Signed)
Patient stayed 15 minutes for observation post iron infusion. VSS, and no signs of distress noted at discharge.

## 2022-07-24 ENCOUNTER — Telehealth: Payer: Self-pay | Admitting: Hematology and Oncology

## 2022-07-24 NOTE — Telephone Encounter (Signed)
Scheduled appointments per 11/22 los. Patient is aware.

## 2022-07-28 ENCOUNTER — Encounter: Payer: Self-pay | Admitting: Hematology and Oncology

## 2022-07-29 ENCOUNTER — Inpatient Hospital Stay: Payer: Medicaid Other | Attending: Hematology and Oncology

## 2022-07-29 VITALS — BP 106/73 | HR 77 | Temp 97.7°F | Resp 16

## 2022-07-29 DIAGNOSIS — Z9884 Bariatric surgery status: Secondary | ICD-10-CM | POA: Diagnosis not present

## 2022-07-29 DIAGNOSIS — K909 Intestinal malabsorption, unspecified: Secondary | ICD-10-CM | POA: Insufficient documentation

## 2022-07-29 DIAGNOSIS — D508 Other iron deficiency anemias: Secondary | ICD-10-CM | POA: Insufficient documentation

## 2022-07-29 DIAGNOSIS — D509 Iron deficiency anemia, unspecified: Secondary | ICD-10-CM

## 2022-07-29 MED ORDER — SODIUM CHLORIDE 0.9 % IV SOLN
500.0000 mL | Freq: Once | INTRAVENOUS | Status: AC
  Administered 2022-07-29: 500 mL via INTRAVENOUS

## 2022-07-29 MED ORDER — SODIUM CHLORIDE 0.9 % IV SOLN
300.0000 mg | Freq: Once | INTRAVENOUS | Status: AC
  Administered 2022-07-29: 300 mg via INTRAVENOUS
  Filled 2022-07-29: qty 300

## 2022-08-04 ENCOUNTER — Other Ambulatory Visit: Payer: Self-pay | Admitting: Psychiatry

## 2022-08-04 DIAGNOSIS — F331 Major depressive disorder, recurrent, moderate: Secondary | ICD-10-CM

## 2022-08-04 DIAGNOSIS — F4310 Post-traumatic stress disorder, unspecified: Secondary | ICD-10-CM

## 2022-08-04 DIAGNOSIS — F4010 Social phobia, unspecified: Secondary | ICD-10-CM

## 2022-08-04 NOTE — Telephone Encounter (Signed)
Please schedule appt

## 2022-08-05 ENCOUNTER — Inpatient Hospital Stay: Payer: Medicaid Other

## 2022-08-05 VITALS — BP 109/71 | HR 71 | Temp 98.2°F | Resp 17

## 2022-08-05 DIAGNOSIS — D508 Other iron deficiency anemias: Secondary | ICD-10-CM | POA: Diagnosis not present

## 2022-08-05 DIAGNOSIS — Z9884 Bariatric surgery status: Secondary | ICD-10-CM | POA: Diagnosis not present

## 2022-08-05 DIAGNOSIS — K909 Intestinal malabsorption, unspecified: Secondary | ICD-10-CM | POA: Diagnosis not present

## 2022-08-05 DIAGNOSIS — D509 Iron deficiency anemia, unspecified: Secondary | ICD-10-CM

## 2022-08-05 MED ORDER — SODIUM CHLORIDE 0.9 % IV SOLN
300.0000 mg | Freq: Once | INTRAVENOUS | Status: AC
  Administered 2022-08-05: 300 mg via INTRAVENOUS
  Filled 2022-08-05: qty 300

## 2022-08-05 MED ORDER — SODIUM CHLORIDE 0.9 % IV BOLUS FOR AMBISOME: 500.0000 mL | Freq: Once | INTRAVENOUS | Status: DC

## 2022-08-05 MED ORDER — SODIUM CHLORIDE 0.9 % IV SOLN: Freq: Once | INTRAVENOUS | Status: AC

## 2022-08-05 MED ORDER — SODIUM CHLORIDE 0.9 % IV SOLN: Freq: Once | INTRAVENOUS | Status: DC

## 2022-08-05 NOTE — Progress Notes (Signed)
Pt monitored for 30 minute post-observation period. VSS, ambulatory to lobby.

## 2022-08-05 NOTE — Patient Instructions (Signed)

## 2022-08-16 NOTE — Telephone Encounter (Signed)
Pt said she would call back to schedule

## 2022-08-17 NOTE — Telephone Encounter (Signed)
Last seen 3/23 due back at 4 weeks.ok to send with no appt scheduled ?

## 2022-08-31 ENCOUNTER — Other Ambulatory Visit: Payer: Self-pay | Admitting: Psychiatry

## 2022-08-31 DIAGNOSIS — F331 Major depressive disorder, recurrent, moderate: Secondary | ICD-10-CM

## 2022-08-31 DIAGNOSIS — F4310 Post-traumatic stress disorder, unspecified: Secondary | ICD-10-CM

## 2022-08-31 DIAGNOSIS — F4010 Social phobia, unspecified: Secondary | ICD-10-CM

## 2022-09-07 DIAGNOSIS — K594 Anal spasm: Secondary | ICD-10-CM | POA: Diagnosis not present

## 2022-09-07 DIAGNOSIS — R69 Illness, unspecified: Secondary | ICD-10-CM | POA: Diagnosis not present

## 2022-09-07 DIAGNOSIS — I10 Essential (primary) hypertension: Secondary | ICD-10-CM | POA: Diagnosis not present

## 2022-09-19 ENCOUNTER — Encounter: Payer: Self-pay | Admitting: Hematology and Oncology

## 2022-09-22 ENCOUNTER — Encounter: Payer: Self-pay | Admitting: Hematology and Oncology

## 2022-09-27 ENCOUNTER — Encounter: Payer: Self-pay | Admitting: Hematology and Oncology

## 2022-09-28 ENCOUNTER — Ambulatory Visit (INDEPENDENT_AMBULATORY_CARE_PROVIDER_SITE_OTHER): Payer: Self-pay | Admitting: Psychiatry

## 2022-09-28 DIAGNOSIS — F330 Major depressive disorder, recurrent, mild: Secondary | ICD-10-CM

## 2022-09-28 NOTE — Progress Notes (Signed)
      [facility] Counselor/Therapist Progress Note  Patient ID: [name], MRN: [id number],    Date: 09/28/2022  Time Spent: 44 minutes start time 11:16 AM end time 12:00 PM  Treatment Type: Individual Therapy  Reported Symptoms: anxiety, depression, obsessive thinking, rumination, triggered responses  Mental Status Exam:  Appearance:   Well Groomed     Behavior:  Appropriate  Motor:  Normal  Speech/Language:   Normal Rate  Affect:  Appropriate  Mood:  sad  Thought process:  normal  Thought content:    WNL  Sensory/Perceptual disturbances:    WNL  Orientation:  oriented to person, place, time/date, and situation  Attention:  Good  Concentration:  Good  Memory:  WNL  Fund of knowledge:   Good  Insight:    Good  Judgment:   Good  Impulse Control:  Good   Risk Assessment: Danger to Self:  No Self-injurious Behavior: No Danger to Others: No Duty to Warn:no Physical Aggression / Violence:No  Access to Firearms a concern: No  Gang Involvement:No   Subjective: Patient was present for session. She shared she did divorce her husband and started seeing someone else. She shared that she allowed him to move in and after 6 months had him move out due to his difficulty changing. The relationship is still going on even though it is still hard.  Her son had surgery yesterday.  Patient went on to share that she is still trying to figure out why she is having difficulty letting go of someone that she knows is not healthy for her and is not going to make changes that are going to leave them in a positive direction.  Patient was able to recognize that he definitely has a personality disorder as well as an addictive personality and that has not the type of partner she wants.  Patient was encouraged to take things 1 step at a time and to recognize that ending contact may be the healthiest thing that she can do for herself.  She also needs to focus on healing and starting to do some work  on codependency.  Patient was also encouraged to read some books on dealing with somebody with a personality disorder and codependency.  Will address issues further at next session.  Interventions: Solution-Oriented/Positive Psychology and Insight-Oriented  Diagnosis:   ICD-10-CM   1. Mild episode of recurrent major depressive disorder ([place])  F33.0       Plan: Patient is to start working on codependency issues by reading some books on codependency recommended in session. Patient is to use CBT and coping skills to improve her self confidence.  Patient is to start spending more time with her daughter and see what develops from their relationship.  Patient is to try and focus on what she finds positive about her husband and not feel pushed to make a decision about whether to move forward with a divorce.  Patient is to find ways to have time alone to decompress.  Patient is to take medication as directed Long term goal:Establish an inward sense of self -worth, confidence, and competence Short term goal: Use positive self - talk messages to build self-esteem improve self acceptance and decrease guilt and shame    [name], [facility]

## 2022-10-04 ENCOUNTER — Other Ambulatory Visit: Payer: Self-pay | Admitting: *Deleted

## 2022-10-04 DIAGNOSIS — D509 Iron deficiency anemia, unspecified: Secondary | ICD-10-CM

## 2022-10-06 ENCOUNTER — Other Ambulatory Visit: Payer: Self-pay

## 2022-10-06 ENCOUNTER — Inpatient Hospital Stay: Payer: Medicaid Other | Attending: Hematology and Oncology

## 2022-10-06 ENCOUNTER — Other Ambulatory Visit: Payer: Self-pay | Admitting: *Deleted

## 2022-10-06 DIAGNOSIS — D508 Other iron deficiency anemias: Secondary | ICD-10-CM | POA: Diagnosis not present

## 2022-10-06 DIAGNOSIS — D509 Iron deficiency anemia, unspecified: Secondary | ICD-10-CM

## 2022-10-06 DIAGNOSIS — K909 Intestinal malabsorption, unspecified: Secondary | ICD-10-CM | POA: Diagnosis not present

## 2022-10-06 DIAGNOSIS — R5383 Other fatigue: Secondary | ICD-10-CM | POA: Insufficient documentation

## 2022-10-06 LAB — CMP (CANCER CENTER ONLY)
ALT: 11 U/L (ref 0–44)
AST: 12 U/L — ABNORMAL LOW (ref 15–41)
Albumin: 4.1 g/dL (ref 3.5–5.0)
Alkaline Phosphatase: 43 U/L (ref 38–126)
Anion gap: 8 (ref 5–15)
BUN: 20 mg/dL (ref 6–20)
CO2: 21 mmol/L — ABNORMAL LOW (ref 22–32)
Calcium: 9.2 mg/dL (ref 8.9–10.3)
Chloride: 108 mmol/L (ref 98–111)
Creatinine: 0.59 mg/dL (ref 0.44–1.00)
GFR, Estimated: >60 mL/min (ref >60)
Glucose, Bld: 81 mg/dL (ref 70–99)
Potassium: 4 mmol/L (ref 3.5–5.1)
Sodium: 137 mmol/L (ref 135–145)
Total Bilirubin: 0.3 mg/dL (ref 0.3–1.2)
Total Protein: 7 g/dL (ref 6.5–8.1)

## 2022-10-06 LAB — CBC WITH DIFFERENTIAL (CANCER CENTER ONLY)
Abs Immature Granulocytes: 0.01 10*3/uL (ref 0.00–0.07)
Basophils Absolute: 0.1 10*3/uL (ref 0.0–0.1)
Basophils Relative: 1 %
Eosinophils Absolute: 0.1 10*3/uL (ref 0.0–0.5)
Eosinophils Relative: 1 %
HCT: 41.3 % (ref 36.0–46.0)
Hemoglobin: 13.7 g/dL (ref 12.0–15.0)
Immature Granulocytes: 0 %
Lymphocytes Relative: 43 %
Lymphs Abs: 3 10*3/uL (ref 0.7–4.0)
MCH: 29.1 pg (ref 26.0–34.0)
MCHC: 33.2 g/dL (ref 30.0–36.0)
MCV: 87.9 fL (ref 80.0–100.0)
Monocytes Absolute: 0.3 10*3/uL (ref 0.1–1.0)
Monocytes Relative: 5 %
Neutro Abs: 3.4 10*3/uL (ref 1.7–7.7)
Neutrophils Relative %: 50 %
Platelet Count: 321 10*3/uL (ref 150–400)
RBC: 4.7 MIL/uL (ref 3.87–5.11)
RDW: 17.2 % — ABNORMAL HIGH (ref 11.5–15.5)
WBC Count: 6.9 10*3/uL (ref 4.0–10.5)
nRBC: 0 % (ref 0.0–0.2)

## 2022-10-06 LAB — IRON AND IRON BINDING CAPACITY (CC-WL,HP ONLY)
Iron: 36 ug/dL (ref 28–170)
Saturation Ratios: 9 % — ABNORMAL LOW (ref 10.4–31.8)
TIBC: 406 ug/dL (ref 250–450)
UIBC: 370 ug/dL (ref 148–442)

## 2022-10-06 LAB — FERRITIN: Ferritin: 14 ng/mL (ref 11–307)

## 2022-10-06 LAB — TSH: TSH: 5.708 u[IU]/mL — ABNORMAL HIGH (ref 0.350–4.500)

## 2022-10-06 NOTE — Progress Notes (Signed)
HEMATOLOGY-ONCOLOGY TELEPHONE VISIT PROGRESS NOTE  I connected with our patient on 10/09/22 at 11:00 AM EST by telephone and verified that I am speaking with the correct person using two identifiers.  I discussed the limitations, risks, security and privacy concerns of performing an evaluation and management service by telephone and the availability of in person appointments.  I also discussed with the patient that there may be a patient responsible charge related to this service. The patient expressed understanding and agreed to proceed.   History of Present Illness: [name] is a 47 y.o. female with above-mentioned history of anemia. She presents to the clinic for a telephone follow-up to discuss lab results.  REVIEW OF SYSTEMS:   Constitutional: Denies fevers, chills or abnormal weight loss All other systems were reviewed with the patient and are negative. Observations/Objective:     Assessment Plan:  Microcytic anemia Most likely related to iron deficiency as result of gastric bypass surgery causing malabsorption   Lab review: 01/27/2019: Hemoglobin 11.4, MCV 78.3, RDW 15.9 05/26/2020: Hemoglobin 8.5, MCV 66.5, platelets 519, creatinine 0.6, TSH 0.79, B12 1739 09/28/20: Hb 13.1, MCV 82.2, Ferritin: 9, Iron Sat 6%, TIBC 394 01/21/2021: Hemoglobin 14.3, MCV 85, ferritin 60, iron saturation 14% 07/19/2022: Hemoglobin 9.1, ferritin 5 10/06/22: Hb 13.7, MCV 88, Ferritin 14, Sat: 9%   IV Iron: Oct-Nov 2021, February 2022, Nov 2023   Profound fatigue: Because of continuing symptoms of iron deficiency and iron saturation below 90%, recommended 3 more doses of IV iron Venofer.   We will recheck lab in 4 months and telephone visit after that to discuss results.      I discussed the assessment and treatment plan with the patient. The patient was provided an opportunity to ask questions and all were answered. The patient agreed with the plan and demonstrated an understanding of the  instructions. The patient was advised to call back or seek an in-person evaluation if the symptoms worsen or if the condition fails to improve as anticipated.   I provided 12 minutes of non-face-to-face time during this encounter.  This includes time for charting and coordination of care   Harriette Ohara, MD  I Gardiner Coins am acting as a scribe for Dr.Vinay Gudena  I have reviewed the above documentation for accuracy and completeness, and I agree with the above.

## 2022-10-07 NOTE — Assessment & Plan Note (Signed)
Most likely related to iron deficiency as result of gastric bypass surgery causing malabsorption   Lab review: 01/27/2019: Hemoglobin 11.4, MCV 78.3, RDW 15.9 05/26/2020: Hemoglobin 8.5, MCV 66.5, platelets 519, creatinine 0.6, TSH 0.79, B12 1739 09/28/20: Hb 13.1, MCV 82.2, Ferritin: 9, Iron Sat 6%, TIBC 394 01/21/2021: Hemoglobin 14.3, MCV 85, ferritin 60, iron saturation 14% 07/19/2022: Hemoglobin 9.1, ferritin 5 10/06/22: Hb 13.7, MCV 88, Ferritin 14, Sat: 9%   IV Iron: Oct-Nov 2021, February 2022, Nov 2023   Profound fatigue: I suspect that she is once again iron deficient.  We will also check for CMP to see if she is dehydrated. She has been set up the Saturday for IV iron.  We will plan for total of 3 doses of IV iron Venofer.   We will recheck lab in 3 months and telephone visit after that to discuss results.

## 2022-10-09 ENCOUNTER — Inpatient Hospital Stay (HOSPITAL_BASED_OUTPATIENT_CLINIC_OR_DEPARTMENT_OTHER): Payer: Medicaid Other | Admitting: Hematology and Oncology

## 2022-10-09 DIAGNOSIS — D509 Iron deficiency anemia, unspecified: Secondary | ICD-10-CM | POA: Diagnosis not present

## 2022-10-19 ENCOUNTER — Telehealth: Payer: Self-pay | Admitting: Hematology and Oncology

## 2022-10-19 ENCOUNTER — Ambulatory Visit: Payer: BC Managed Care – PPO | Admitting: Psychiatry

## 2022-10-19 NOTE — Telephone Encounter (Signed)
Per 2/21 IB, message left about appts

## 2022-10-21 ENCOUNTER — Encounter: Payer: Self-pay | Admitting: Hematology and Oncology

## 2022-10-23 ENCOUNTER — Inpatient Hospital Stay: Payer: Medicaid Other

## 2022-10-27 ENCOUNTER — Encounter: Payer: Self-pay | Admitting: Hematology and Oncology

## 2022-10-28 ENCOUNTER — Inpatient Hospital Stay: Payer: 59 | Attending: Hematology and Oncology

## 2022-10-28 VITALS — BP 116/81 | HR 71 | Temp 97.7°F | Resp 16

## 2022-10-28 DIAGNOSIS — D509 Iron deficiency anemia, unspecified: Secondary | ICD-10-CM

## 2022-10-28 DIAGNOSIS — D508 Other iron deficiency anemias: Secondary | ICD-10-CM | POA: Diagnosis not present

## 2022-10-28 DIAGNOSIS — K909 Intestinal malabsorption, unspecified: Secondary | ICD-10-CM | POA: Insufficient documentation

## 2022-10-28 MED ORDER — SODIUM CHLORIDE 0.9 % IV SOLN
300.0000 mg | Freq: Once | INTRAVENOUS | Status: AC
  Administered 2022-10-28: 300 mg via INTRAVENOUS
  Filled 2022-10-28: qty 300

## 2022-10-28 MED ORDER — SODIUM CHLORIDE 0.9 % IV SOLN: Freq: Once | INTRAVENOUS | Status: AC

## 2022-10-28 NOTE — Patient Instructions (Signed)

## 2022-10-30 ENCOUNTER — Ambulatory Visit: Payer: Medicaid Other

## 2022-11-03 MED FILL — Iron Sucrose Inj 20 MG/ML (Fe Equiv): INTRAVENOUS | Qty: 15 | Status: AC

## 2022-11-04 ENCOUNTER — Inpatient Hospital Stay: Payer: 59

## 2022-11-06 ENCOUNTER — Ambulatory Visit: Payer: Medicaid Other

## 2022-11-11 ENCOUNTER — Inpatient Hospital Stay: Payer: 59

## 2022-11-11 VITALS — BP 104/66 | HR 69 | Temp 97.5°F | Resp 16

## 2022-11-11 DIAGNOSIS — D509 Iron deficiency anemia, unspecified: Secondary | ICD-10-CM

## 2022-11-11 DIAGNOSIS — K909 Intestinal malabsorption, unspecified: Secondary | ICD-10-CM | POA: Diagnosis not present

## 2022-11-11 DIAGNOSIS — D508 Other iron deficiency anemias: Secondary | ICD-10-CM | POA: Diagnosis not present

## 2022-11-11 MED ORDER — SODIUM CHLORIDE 0.9 % IV SOLN
300.0000 mg | Freq: Once | INTRAVENOUS | Status: AC
  Administered 2022-11-11: 300 mg via INTRAVENOUS
  Filled 2022-11-11: qty 300

## 2022-11-11 MED ORDER — SODIUM CHLORIDE 0.9 % IV SOLN: Freq: Once | INTRAVENOUS | Status: AC

## 2022-11-11 NOTE — Patient Instructions (Signed)

## 2022-11-11 NOTE — Progress Notes (Signed)
Patient declined to stay for post venofer monitoring. VSS and ambulatory at discharge.  

## 2022-11-23 ENCOUNTER — Encounter: Payer: Self-pay | Admitting: Hematology and Oncology

## 2022-11-23 ENCOUNTER — Telehealth: Payer: Self-pay | Admitting: Hematology and Oncology

## 2022-11-23 NOTE — Telephone Encounter (Signed)
Per 3/27 IB reached out to patient to schedule; patient aware of date and time of appointment,

## 2022-12-09 ENCOUNTER — Inpatient Hospital Stay: Payer: 59 | Attending: Hematology and Oncology

## 2022-12-09 VITALS — BP 112/76 | HR 75 | Temp 97.9°F | Resp 18

## 2022-12-09 DIAGNOSIS — D508 Other iron deficiency anemias: Secondary | ICD-10-CM | POA: Insufficient documentation

## 2022-12-09 DIAGNOSIS — K909 Intestinal malabsorption, unspecified: Secondary | ICD-10-CM | POA: Diagnosis not present

## 2022-12-09 DIAGNOSIS — D509 Iron deficiency anemia, unspecified: Secondary | ICD-10-CM

## 2022-12-09 MED ORDER — SODIUM CHLORIDE 0.9 % IV SOLN
300.0000 mg | Freq: Once | INTRAVENOUS | Status: AC
  Administered 2022-12-09: 300 mg via INTRAVENOUS
  Filled 2022-12-09: qty 300

## 2022-12-09 MED ORDER — SODIUM CHLORIDE 0.9 % IV SOLN: Freq: Once | INTRAVENOUS | Status: AC

## 2022-12-09 NOTE — Patient Instructions (Incomplete)

## 2022-12-13 ENCOUNTER — Telehealth: Payer: Self-pay | Admitting: Hematology and Oncology

## 2022-12-13 NOTE — Telephone Encounter (Signed)
Rescheduled appointment per provider PAL. Patient is aware of the changes made to her upcoming appointment. 

## 2022-12-15 ENCOUNTER — Other Ambulatory Visit: Payer: Self-pay | Admitting: *Deleted

## 2022-12-15 ENCOUNTER — Telehealth: Payer: Self-pay | Admitting: Hematology and Oncology

## 2022-12-15 DIAGNOSIS — D509 Iron deficiency anemia, unspecified: Secondary | ICD-10-CM

## 2022-12-15 NOTE — Telephone Encounter (Signed)
Rescheduled appointment per room/Resource. FU appointments need to be at least 4/6 weeks last last iron infusion (4/13).  Left voicemail.

## 2022-12-18 ENCOUNTER — Inpatient Hospital Stay: Payer: 59

## 2022-12-19 ENCOUNTER — Other Ambulatory Visit: Payer: Self-pay | Admitting: *Deleted

## 2022-12-19 ENCOUNTER — Telehealth: Payer: Self-pay | Admitting: *Deleted

## 2022-12-19 ENCOUNTER — Inpatient Hospital Stay: Payer: 59

## 2022-12-19 DIAGNOSIS — D508 Other iron deficiency anemias: Secondary | ICD-10-CM | POA: Diagnosis not present

## 2022-12-19 DIAGNOSIS — R5383 Other fatigue: Secondary | ICD-10-CM

## 2022-12-19 DIAGNOSIS — D509 Iron deficiency anemia, unspecified: Secondary | ICD-10-CM

## 2022-12-19 DIAGNOSIS — K909 Intestinal malabsorption, unspecified: Secondary | ICD-10-CM | POA: Diagnosis not present

## 2022-12-19 LAB — CBC WITH DIFFERENTIAL (CANCER CENTER ONLY)
Abs Immature Granulocytes: 0 10*3/uL (ref 0.00–0.07)
Basophils Absolute: 0 10*3/uL (ref 0.0–0.1)
Basophils Relative: 1 %
Eosinophils Absolute: 0.1 10*3/uL (ref 0.0–0.5)
Eosinophils Relative: 2 %
HCT: 42 % (ref 36.0–46.0)
Hemoglobin: 13.9 g/dL (ref 12.0–15.0)
Immature Granulocytes: 0 %
Lymphocytes Relative: 36 %
Lymphs Abs: 2 10*3/uL (ref 0.7–4.0)
MCH: 29.6 pg (ref 26.0–34.0)
MCHC: 33.1 g/dL (ref 30.0–36.0)
MCV: 89.6 fL (ref 80.0–100.0)
Monocytes Absolute: 0.3 10*3/uL (ref 0.1–1.0)
Monocytes Relative: 6 %
Neutro Abs: 3.1 10*3/uL (ref 1.7–7.7)
Neutrophils Relative %: 55 %
Platelet Count: 283 10*3/uL (ref 150–400)
RBC: 4.69 MIL/uL (ref 3.87–5.11)
RDW: 13.5 % (ref 11.5–15.5)
WBC Count: 5.5 10*3/uL (ref 4.0–10.5)
nRBC: 0 % (ref 0.0–0.2)

## 2022-12-19 LAB — FERRITIN: Ferritin: 162 ng/mL (ref 11–307)

## 2022-12-19 LAB — IRON AND IRON BINDING CAPACITY (CC-WL,HP ONLY)
Iron: 57 ug/dL (ref 28–170)
Saturation Ratios: 20 % (ref 10.4–31.8)
TIBC: 280 ug/dL (ref 250–450)
UIBC: 223 ug/dL (ref 148–442)

## 2022-12-19 LAB — TSH: TSH: 1.213 u[IU]/mL (ref 0.350–4.500)

## 2022-12-19 NOTE — Telephone Encounter (Signed)
Notified of message below

## 2022-12-19 NOTE — Telephone Encounter (Signed)
-----   Message from Loa Socks, NP sent at 12/19/2022  4:12 PM EDT ----- Iron studies are improved, no iron needed at this time, please let patient know ----- Message ----- From: Interface, Lab [facility] [place] Sent: 12/19/2022   7:58 AM EDT To: Loa Socks, NP

## 2022-12-21 ENCOUNTER — Ambulatory Visit: Payer: Medicaid Other | Admitting: Hematology and Oncology

## 2022-12-22 ENCOUNTER — Telehealth: Payer: Medicaid Other | Admitting: Adult Health

## 2023-01-08 ENCOUNTER — Other Ambulatory Visit: Payer: Self-pay | Admitting: *Deleted

## 2023-01-08 DIAGNOSIS — F329 Major depressive disorder, single episode, unspecified: Secondary | ICD-10-CM | POA: Diagnosis not present

## 2023-01-08 DIAGNOSIS — I10 Essential (primary) hypertension: Secondary | ICD-10-CM | POA: Diagnosis not present

## 2023-01-08 DIAGNOSIS — J018 Other acute sinusitis: Secondary | ICD-10-CM | POA: Diagnosis not present

## 2023-01-08 DIAGNOSIS — D509 Iron deficiency anemia, unspecified: Secondary | ICD-10-CM

## 2023-01-08 DIAGNOSIS — C73 Malignant neoplasm of thyroid gland: Secondary | ICD-10-CM | POA: Diagnosis not present

## 2023-01-08 DIAGNOSIS — Z6832 Body mass index (BMI) 32.0-32.9, adult: Secondary | ICD-10-CM | POA: Diagnosis not present

## 2023-01-08 DIAGNOSIS — E039 Hypothyroidism, unspecified: Secondary | ICD-10-CM | POA: Diagnosis not present

## 2023-01-08 DIAGNOSIS — H6981 Other specified disorders of Eustachian tube, right ear: Secondary | ICD-10-CM | POA: Diagnosis not present

## 2023-01-08 DIAGNOSIS — E6609 Other obesity due to excess calories: Secondary | ICD-10-CM | POA: Diagnosis not present

## 2023-01-09 DIAGNOSIS — H938X3 Other specified disorders of ear, bilateral: Secondary | ICD-10-CM | POA: Diagnosis not present

## 2023-01-09 DIAGNOSIS — R42 Dizziness and giddiness: Secondary | ICD-10-CM | POA: Diagnosis not present

## 2023-01-10 ENCOUNTER — Inpatient Hospital Stay: Payer: 59 | Attending: Hematology and Oncology

## 2023-01-12 ENCOUNTER — Telehealth: Payer: Self-pay | Admitting: Adult Health

## 2023-01-12 ENCOUNTER — Inpatient Hospital Stay (HOSPITAL_BASED_OUTPATIENT_CLINIC_OR_DEPARTMENT_OTHER): Payer: 59 | Admitting: Adult Health

## 2023-01-12 ENCOUNTER — Encounter: Payer: Self-pay | Admitting: Adult Health

## 2023-01-12 DIAGNOSIS — D509 Iron deficiency anemia, unspecified: Secondary | ICD-10-CM | POA: Diagnosis not present

## 2023-01-12 DIAGNOSIS — K589 Irritable bowel syndrome without diarrhea: Secondary | ICD-10-CM | POA: Insufficient documentation

## 2023-01-12 DIAGNOSIS — E039 Hypothyroidism, unspecified: Secondary | ICD-10-CM | POA: Diagnosis not present

## 2023-01-12 MED ORDER — LEVOTHYROXINE SODIUM 200 MCG PO TABS
200.0000 ug | ORAL_TABLET | Freq: Every day | ORAL | 2 refills | Status: AC
Start: 2023-01-12 — End: ?

## 2023-01-12 NOTE — Assessment & Plan Note (Signed)
Most likely related to iron deficiency as result of gastric bypass surgery causing malabsorption   S/p IV iron 3/2 every 2 weeks x 3  Ferritin is much improved along with her iron saturation.  She does not need IV iron at this point but I recommended follow-up in 12 weeks with labs prior.

## 2023-01-12 NOTE — Assessment & Plan Note (Signed)
On thyroid replacement.  Most recent TSH is 1.2.  I recommended she stop Levothryoxine BID and take daily.  She needed this sent into her pharmacy.    Repeat TSH in 12 weeks.

## 2023-01-12 NOTE — Telephone Encounter (Signed)
Scheduled appointment per 5/17 los. Patient is aware of the made appointments. 

## 2023-01-12 NOTE — Progress Notes (Signed)
[facility] Cancer Follow up:    Dana Nevins, MD [address] [place] [address]  I connected with [name] on 01/12/23 at 12:15 PM EDT by telephone and verified that I am speaking with the correct person using two identifiers.  I discussed the limitations, risks, security and privacy concerns of performing an evaluation and management service by telephone and the availability of in person appointments.  I also discussed with the patient that there may be a patient responsible charge related to this service. The patient expressed understanding and agreed to proceed.   Patient location: at work.   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 female returns for follow-up after undergoing lab testing after receiving IV Venofer 300 mg every other week x 3 beginning March 2.  Lab testing on April 23 indicated an improvement in her ferritin to 162, and improvement in her iron saturation to 20%, TIBC improved to 280.  She notes her fatigue has improved. She is taking Synthroid  BID and her most recent TSH was 1.2.  Three months prior when her TSH was 5.4    Patient Active Problem List   Diagnosis Date Noted   Irritable bowel syndrome 01/12/2023   Hypothyroidism 01/12/2023   Microcytic anemia 06/15/2020   Pregnancy 03/06/2017   NSVD (normal spontaneous vaginal delivery) 03/06/2017   Active labor at term 04/21/2015   Gastric bypass status for obesity 03/18/2014   Chest pain 09/18/2013   Obesity, morbid, BMI 50 or higher  (HCC) 09/18/2013   CONSTIPATION 12/10/2009    has No Known Allergies.  MEDICAL HISTORY: Past Medical History:  Diagnosis Date   Anemia    with iron infusions   Anxiety    panic attacks   Cancer (HCC) 2010   thyroidectomy   Dysrhythmia    no meds currently- palpitations assoc with panic disorder or thyroid disease   GERD (gastroesophageal reflux disease)    Hypothyroidism    PCOS (polycystic ovarian syndrome)    PONV (postoperative nausea and vomiting)    Thyroid disease     SURGICAL HISTORY: Past Surgical History:  Procedure Laterality Date   bilateral arm lift surgery  2005   CHOLECYSTECTOMY  11/01/2011   Procedure: LAPAROSCOPIC CHOLECYSTECTOMY;  Surgeon: Fabio Bering, MD;  Location: [facility];  Service: General;  Laterality: N/A;   colonscopy     DIAGNOSTIC LAPAROSCOPY  1994   x 2   DILATION AND CURETTAGE OF UTERUS  10/12/2011   Procedure: DILATATION AND CURETTAGE;  Surgeon: Jeani Hawking, MD;  Location: [facility];  Service: Gynecology;  Laterality: N/A;   DILATION AND CURETTAGE OF UTERUS N/A 08/19/2013   Procedure: DILATATION AND CURETTAGE;  Surgeon: Jeani Hawking, MD;  Location: [facility];  Service: Gynecology;  Laterality: N/A;   EYE SURGERY     left eye x 2 surgeries, 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)   GASTRIC BYPASS     OVARIAN CYST REMOVAL  '95   removal of IUD     right tube removed & ovarian cyst '11     THYROIDECTOMY  TONSILLECTOMY AND ADENOIDECTOMY     TUBAL LIGATION N/A 03/06/2017   Procedure: POST PARTUM TUBAL LIGATION;  Surgeon: Marcelle Overlie, MD;  Location: [facility] SU[facility];  Service: Gynecology;  Laterality: N/A;    SOCIAL HISTORY: Social History   Socioeconomic History   Marital status: Married    Spouse name: Not on file   Number of children: Not on file   Years of education: Not on file   Highest education level: Not on file  Occupational History   Not on file  Tobacco Use   Smoking status: Former    Packs/day: 1.00    Years:  5.00    Additional pack years: 0.00    Total pack years: 5.00    Types: Cigarettes    Quit date: 01/26/2005    Years since quitting: 17.9   Smokeless tobacco: Never  Substance and Sexual Activity   Alcohol use: Not Currently   Drug use: No   Sexual activity: Yes    Birth control/protection: None  Other Topics Concern   Not on file  Social History Narrative   Lives at home with husband, 2, 4, 13, 21, 85, 46, granddaughter.     Social Determinants of Health   Financial Resource Strain: Not on file  Food Insecurity: Not on file  Transportation Needs: Not on file  Physical Activity: Not on file  Stress: Not on file  Social Connections: Not on file  Intimate Partner Violence: Not on file    FAMILY HISTORY: Family History  Problem Relation Age of Onset   Heart attack Father 96       Died age 57   Hypertension Father    Heart disease Father    Diabetes Paternal Grandmother    Heart disease Paternal Grandmother    Breast cancer Paternal Grandmother    Heart disease Paternal Grandfather    Stroke Paternal Grandfather    Bipolar disorder Maternal Grandmother    Anxiety disorder Daughter    Anesthesia problems Neg Hx    Hypotension Neg Hx    Malignant hyperthermia Neg Hx    Pseudochol deficiency Neg Hx     Review of Systems  Constitutional:  Positive for fatigue. Negative for appetite change, chills, fever and unexpected weight change.  HENT:   Negative for hearing loss, lump/mass and trouble swallowing.   Eyes:  Negative for eye problems and icterus.  Respiratory:  Negative for chest tightness, cough and shortness of breath.   Cardiovascular:  Negative for chest pain, leg swelling and palpitations.  Gastrointestinal:  Negative for abdominal distention, abdominal pain, constipation, diarrhea, nausea and vomiting.  Endocrine: Negative for hot flashes.  Genitourinary:  Negative for difficulty urinating.   Musculoskeletal:  Negative for arthralgias.  Skin:  Negative for  itching and rash.  Neurological:  Negative for dizziness, extremity weakness, headaches and numbness.  Hematological:  Negative for adenopathy. Does not bruise/bleed easily.  Psychiatric/Behavioral:  Negative for depression. The patient is not nervous/anxious.       PHYSICAL EXAMINATION Patient sounds well, she is in no apparent distress, mood and behavior normal.  LABORATORY DATA: Reviewed      ASSESSMENT and THERAPY PLAN:   Hypothyroidism On thyroid replacement.  Most recent TSH is 1.2.  I recommended she stop Levothryoxine BID and take daily.  She needed this sent into her pharmacy.    Repeat TSH in 12 weeks.    Microcytic anemia Most likely related to iron deficiency as result of gastric bypass surgery causing malabsorption  S/p IV iron 3/2 every 2 weeks x 3  Ferritin is much improved along with her iron saturation.  She does not need IV iron at this point but I recommended follow-up in 12 weeks with labs prior. Follow up instructions:    -Return to cancer center in 12 weeks for labs and f/u with Dr. Pamelia Hoit   The patient was provided an opportunity to ask questions and all were answered. The patient agreed with the plan and demonstrated an understanding of the instructions.   The patient was advised to call back or seek an in-person evaluation if the symptoms worsen or if the condition fails to improve as anticipated.   I provided 12 minutes of non face-to-face telephone visit time during this encounter, and > 50% was spent counseling as documented under my assessment & plan.  Lillard Anes, NP 01/12/23 12:43 PM [facility] [address] [place], [place] [address] Tel. [phone]    Fax. [phone]

## 2023-03-30 ENCOUNTER — Encounter: Payer: Self-pay | Admitting: Hematology and Oncology

## 2023-04-03 NOTE — Progress Notes (Signed)
HEMATOLOGY-ONCOLOGY TELEPHONE VISIT PROGRESS NOTE  I connected with our patient on 04/09/23 at  8:45 AM EDT by telephone and verified that I am speaking with the correct person using two identifiers.  I discussed the limitations, risks, security and privacy concerns of performing an evaluation and management service by telephone and the availability of in person appointments.  I also discussed with the patient that there may be a patient responsible charge related to this service. The patient expressed understanding and agreed to proceed.   History of Present Illness: [name] is a 47 y.o. female with above-mentioned history of anemia. She presents to the clinic for a telephone follow-up to discuss lab results.  She complains of profound fatigue.  After she receives IV iron her symptoms improved for about a month or so and then she starts getting worse.  The cause of iron deficiency is felt to be due to her prior gastric bypass surgery.  REVIEW OF SYSTEMS:   Constitutional: Denies fevers, chills or abnormal weight loss All other systems were reviewed with the patient and are negative. Observations/Objective:     Assessment Plan:  Microcytic anemia Most likely related to iron deficiency as result of gastric bypass surgery causing malabsorption   Lab review: 01/27/2019: Hemoglobin 11.4, MCV 78.3, RDW 15.9 05/26/2020: Hemoglobin 8.5, MCV 66.5, platelets 519, creatinine 0.6, TSH 0.79, B12 1739 09/28/20: Hb 13.1, MCV 82.2, Ferritin: 9, Iron Sat 6%, TIBC 394 01/21/2021: Hemoglobin 14.3, MCV 85, ferritin 60, iron saturation 14% 07/19/2022: Hemoglobin 9.1, ferritin 5 10/06/22: Hb 13.7, MCV 88, Ferritin 14, Sat: 9% 04/06/2023: Hemoglobin 14.7, MCV 90.5, ferritin 21, iron saturation 17%   IV Iron: Oct-Nov 2021, February 2022, Nov 2023, March 2024   Profound fatigue: Continuing issues with fatigue which get better after she receives IV iron.  With the ferritin decreasing down to 21, I recommend  IV iron again.  She might need iron every 6 months. With elevated TSH, she will discuss with Dr. Talmage Nap to adjust her thyroid medication.  We will recheck lab in 3 months and telephone visit after that to discuss results.    I discussed the assessment and treatment plan with the patient. The patient was provided an opportunity to ask questions and all were answered. The patient agreed with the plan and demonstrated an understanding of the instructions. The patient was advised to call back or seek an in-person evaluation if the symptoms worsen or if the condition fails to improve as anticipated.   I provided 12 minutes of non-face-to-face time during this encounter.  This includes time for charting and coordination of care   Tamsen Meek, MD  I Janan Ridge am acting as a scribe for Dr.Ricquel Foulk  I have reviewed the above documentation for accuracy and completeness, and I agree with the above.

## 2023-04-06 ENCOUNTER — Inpatient Hospital Stay: Payer: 59 | Attending: Hematology and Oncology

## 2023-04-06 ENCOUNTER — Other Ambulatory Visit: Payer: Self-pay

## 2023-04-06 DIAGNOSIS — D509 Iron deficiency anemia, unspecified: Secondary | ICD-10-CM | POA: Insufficient documentation

## 2023-04-06 LAB — IRON AND IRON BINDING CAPACITY (CC-WL,HP ONLY)
Iron: 64 ug/dL (ref 28–170)
Saturation Ratios: 17 % (ref 10.4–31.8)
TIBC: 384 ug/dL (ref 250–450)
UIBC: 320 ug/dL (ref 148–442)

## 2023-04-06 LAB — CBC WITH DIFFERENTIAL (CANCER CENTER ONLY)
Abs Immature Granulocytes: 0.01 10*3/uL (ref 0.00–0.07)
Basophils Absolute: 0.1 10*3/uL (ref 0.0–0.1)
Basophils Relative: 1 %
Eosinophils Absolute: 0.1 10*3/uL (ref 0.0–0.5)
Eosinophils Relative: 2 %
HCT: 42.8 % (ref 36.0–46.0)
Hemoglobin: 14.7 g/dL (ref 12.0–15.0)
Immature Granulocytes: 0 %
Lymphocytes Relative: 39 %
Lymphs Abs: 2.3 10*3/uL (ref 0.7–4.0)
MCH: 31.1 pg (ref 26.0–34.0)
MCHC: 34.3 g/dL (ref 30.0–36.0)
MCV: 90.5 fL (ref 80.0–100.0)
Monocytes Absolute: 0.3 10*3/uL (ref 0.1–1.0)
Monocytes Relative: 5 %
Neutro Abs: 3.2 10*3/uL (ref 1.7–7.7)
Neutrophils Relative %: 53 %
Platelet Count: 301 10*3/uL (ref 150–400)
RBC: 4.73 MIL/uL (ref 3.87–5.11)
RDW: 12.9 % (ref 11.5–15.5)
WBC Count: 6 10*3/uL (ref 4.0–10.5)
nRBC: 0 % (ref 0.0–0.2)

## 2023-04-06 LAB — CMP (CANCER CENTER ONLY)
ALT: 16 U/L (ref 0–44)
AST: 17 U/L (ref 15–41)
Albumin: 4.2 g/dL (ref 3.5–5.0)
Alkaline Phosphatase: 42 U/L (ref 38–126)
Anion gap: 8 (ref 5–15)
BUN: 20 mg/dL (ref 6–20)
CO2: 23 mmol/L (ref 22–32)
Calcium: 9.2 mg/dL (ref 8.9–10.3)
Chloride: 106 mmol/L (ref 98–111)
Creatinine: 0.64 mg/dL (ref 0.44–1.00)
GFR, Estimated: >60 mL/min (ref >60)
Glucose, Bld: 96 mg/dL (ref 70–99)
Potassium: 4 mmol/L (ref 3.5–5.1)
Sodium: 137 mmol/L (ref 135–145)
Total Bilirubin: 0.4 mg/dL (ref 0.3–1.2)
Total Protein: 7 g/dL (ref 6.5–8.1)

## 2023-04-06 LAB — TSH: TSH: 11.225 u[IU]/mL — ABNORMAL HIGH (ref 0.350–4.500)

## 2023-04-06 LAB — FERRITIN: Ferritin: 21 ng/mL (ref 11–307)

## 2023-04-09 ENCOUNTER — Inpatient Hospital Stay (HOSPITAL_BASED_OUTPATIENT_CLINIC_OR_DEPARTMENT_OTHER): Payer: 59 | Admitting: Hematology and Oncology

## 2023-04-09 DIAGNOSIS — R5383 Other fatigue: Secondary | ICD-10-CM | POA: Diagnosis not present

## 2023-04-09 DIAGNOSIS — D509 Iron deficiency anemia, unspecified: Secondary | ICD-10-CM | POA: Diagnosis not present

## 2023-04-09 NOTE — Assessment & Plan Note (Signed)
Most likely related to iron deficiency as result of gastric bypass surgery causing malabsorption   Lab review: 01/27/2019: Hemoglobin 11.4, MCV 78.3, RDW 15.9 05/26/2020: Hemoglobin 8.5, MCV 66.5, platelets 519, creatinine 0.6, TSH 0.79, B12 1739 09/28/20: Hb 13.1, MCV 82.2, Ferritin: 9, Iron Sat 6%, TIBC 394 01/21/2021: Hemoglobin 14.3, MCV 85, ferritin 60, iron saturation 14% 07/19/2022: Hemoglobin 9.1, ferritin 5 10/06/22: Hb 13.7, MCV 88, Ferritin 14, Sat: 9% 04/06/2023: Hemoglobin 14.7, MCV 90.5, ferritin 21, iron saturation 17%   IV Iron: Oct-Nov 2021, February 2022, Nov 2023, March 2024   Profound fatigue:   We will recheck lab in 3 months and telephone visit after that to discuss results.

## 2023-04-11 ENCOUNTER — Telehealth: Payer: Self-pay | Admitting: Hematology and Oncology

## 2023-04-11 NOTE — Telephone Encounter (Signed)
Scheduled appointments per 8/12 los. Patient is aware of all made appointments.

## 2023-04-12 DIAGNOSIS — Z1231 Encounter for screening mammogram for malignant neoplasm of breast: Secondary | ICD-10-CM | POA: Diagnosis not present

## 2023-04-12 DIAGNOSIS — E559 Vitamin D deficiency, unspecified: Secondary | ICD-10-CM | POA: Diagnosis not present

## 2023-04-12 DIAGNOSIS — E669 Obesity, unspecified: Secondary | ICD-10-CM | POA: Diagnosis not present

## 2023-04-12 DIAGNOSIS — Z01419 Encounter for gynecological examination (general) (routine) without abnormal findings: Secondary | ICD-10-CM | POA: Diagnosis not present

## 2023-04-12 DIAGNOSIS — C73 Malignant neoplasm of thyroid gland: Secondary | ICD-10-CM | POA: Diagnosis not present

## 2023-04-12 DIAGNOSIS — R7301 Impaired fasting glucose: Secondary | ICD-10-CM | POA: Diagnosis not present

## 2023-04-12 DIAGNOSIS — E890 Postprocedural hypothyroidism: Secondary | ICD-10-CM | POA: Diagnosis not present

## 2023-04-12 DIAGNOSIS — D649 Anemia, unspecified: Secondary | ICD-10-CM | POA: Diagnosis not present

## 2023-04-12 DIAGNOSIS — Z6834 Body mass index (BMI) 34.0-34.9, adult: Secondary | ICD-10-CM | POA: Diagnosis not present

## 2023-04-12 DIAGNOSIS — Z113 Encounter for screening for infections with a predominantly sexual mode of transmission: Secondary | ICD-10-CM | POA: Diagnosis not present

## 2023-04-17 ENCOUNTER — Encounter: Payer: Self-pay | Admitting: Hematology and Oncology

## 2023-04-17 ENCOUNTER — Other Ambulatory Visit: Payer: Self-pay | Admitting: Obstetrics and Gynecology

## 2023-04-17 DIAGNOSIS — R928 Other abnormal and inconclusive findings on diagnostic imaging of breast: Secondary | ICD-10-CM

## 2023-04-25 ENCOUNTER — Ambulatory Visit
Admission: RE | Admit: 2023-04-25 | Discharge: 2023-04-25 | Disposition: A | Discharge status: Home / Self Care | Payer: Medicaid Other | Source: Ambulatory Visit | Attending: Obstetrics and Gynecology | Admitting: Obstetrics and Gynecology

## 2023-04-25 ENCOUNTER — Ambulatory Visit
Admission: RE | Admit: 2023-04-25 | Discharge: 2023-04-25 | Disposition: A | Discharge status: Home / Self Care | Payer: 59 | Source: Ambulatory Visit | Attending: Obstetrics and Gynecology | Admitting: Obstetrics and Gynecology

## 2023-04-25 DIAGNOSIS — N6321 Unspecified lump in the left breast, upper outer quadrant: Secondary | ICD-10-CM | POA: Diagnosis not present

## 2023-04-25 DIAGNOSIS — R928 Other abnormal and inconclusive findings on diagnostic imaging of breast: Secondary | ICD-10-CM

## 2023-04-27 MED FILL — Iron Sucrose Inj 20 MG/ML (Fe Equiv): INTRAVENOUS | Qty: 15 | Status: AC

## 2023-04-28 ENCOUNTER — Inpatient Hospital Stay: Payer: 59

## 2023-05-04 ENCOUNTER — Other Ambulatory Visit: Payer: Self-pay | Admitting: Obstetrics and Gynecology

## 2023-05-04 DIAGNOSIS — N6320 Unspecified lump in the left breast, unspecified quadrant: Secondary | ICD-10-CM

## 2023-05-04 MED FILL — Iron Sucrose Inj 20 MG/ML (Fe Equiv): INTRAVENOUS | Qty: 15 | Status: AC

## 2023-05-05 ENCOUNTER — Inpatient Hospital Stay: Payer: 59 | Attending: Hematology and Oncology

## 2023-05-05 DIAGNOSIS — D509 Iron deficiency anemia, unspecified: Secondary | ICD-10-CM | POA: Insufficient documentation

## 2023-05-12 ENCOUNTER — Inpatient Hospital Stay: Payer: 59

## 2023-05-12 VITALS — BP 114/77 | HR 62 | Temp 98.4°F | Resp 16

## 2023-05-12 DIAGNOSIS — D509 Iron deficiency anemia, unspecified: Secondary | ICD-10-CM | POA: Diagnosis not present

## 2023-05-12 MED ORDER — SODIUM CHLORIDE 0.9 % IV SOLN
300.0000 mg | Freq: Once | INTRAVENOUS | Status: AC
  Administered 2023-05-12: 300 mg via INTRAVENOUS
  Filled 2023-05-12: qty 300

## 2023-05-12 MED ORDER — SODIUM CHLORIDE 0.9 % IV SOLN: Freq: Once | INTRAVENOUS | Status: AC

## 2023-05-12 NOTE — Patient Instructions (Signed)
Iron Sucrose Injection What is this medication? IRON SUCROSE (EYE ern SOO krose) treats low levels of iron (iron deficiency anemia) in people with kidney disease. Iron is a mineral that plays an important role in making red blood cells, which carry oxygen from your lungs to the rest of your body. This medicine may be used for other purposes; ask your health care provider or pharmacist if you have questions. COMMON BRAND NAME(S): Venofer What should I tell my care team before I take this medication? They need to know if you have any of these conditions: Anemia not caused by low iron levels Heart disease High levels of iron in the blood Kidney disease Liver disease An unusual or allergic reaction to iron, other medications, foods, dyes, or preservatives Pregnant or trying to get pregnant Breastfeeding How should I use this medication? This medication is for infusion into a vein. It is given in a hospital or clinic setting. Talk to your care team about the use of this medication in children. While this medication may be prescribed for children as young as 2 years for selected conditions, precautions do apply. Overdosage: If you think you have taken too much of this medicine contact a poison control center or emergency room at once. NOTE: This medicine is only for you. Do not share this medicine with others. What if I miss a dose? Keep appointments for follow-up doses. It is important not to miss your dose. Call your care team if you are unable to keep an appointment. What may interact with this medication? Do not take this medication with any of the following: Deferoxamine Dimercaprol Other iron products This medication may also interact with the following: Chloramphenicol Deferasirox This list may not describe all possible interactions. Give your health care provider a list of all the medicines, herbs, non-prescription drugs, or dietary supplements you use. Also tell them if you smoke,  drink alcohol, or use illegal drugs. Some items may interact with your medicine. What should I watch for while using this medication? Visit your care team regularly. Tell your care team if your symptoms do not start to get better or if they get worse. You may need blood work done while you are taking this medication. You may need to follow a special diet. Talk to your care team. Foods that contain iron include: whole grains/cereals, dried fruits, beans, or peas, leafy green vegetables, and organ meats (liver, kidney). What side effects may I notice from receiving this medication? Side effects that you should report to your care team as soon as possible: Allergic reactions--skin rash, itching, hives, swelling of the face, lips, tongue, or throat Low blood pressure--dizziness, feeling faint or lightheaded, blurry vision Shortness of breath Side effects that usually do not require medical attention (report to your care team if they continue or are bothersome): Flushing Headache Joint pain Muscle pain Nausea Pain, redness, or irritation at injection site This list may not describe all possible side effects. Call your doctor for medical advice about side effects. You may report side effects to FDA at 1-800-FDA-1088. Where should I keep my medication? This medication is given in a hospital or clinic. It will not be stored at home. NOTE: This sheet is a summary. It may not cover all possible information. If you have questions about this medicine, talk to your doctor, pharmacist, or health care provider.  2024 Elsevier/Gold Standard (2023-01-19 00:00:00)

## 2023-05-21 ENCOUNTER — Telehealth: Payer: Self-pay | Admitting: Hematology and Oncology

## 2023-05-21 NOTE — Telephone Encounter (Signed)
Patient is aware of scheduled appointment times/dates

## 2023-05-24 DIAGNOSIS — E890 Postprocedural hypothyroidism: Secondary | ICD-10-CM | POA: Diagnosis not present

## 2023-05-24 DIAGNOSIS — D649 Anemia, unspecified: Secondary | ICD-10-CM | POA: Diagnosis not present

## 2023-05-24 DIAGNOSIS — Z9884 Bariatric surgery status: Secondary | ICD-10-CM | POA: Diagnosis not present

## 2023-06-02 ENCOUNTER — Inpatient Hospital Stay: Payer: 59 | Attending: Hematology and Oncology

## 2023-06-02 VITALS — BP 120/79 | HR 75 | Temp 97.3°F | Resp 15

## 2023-06-02 DIAGNOSIS — D509 Iron deficiency anemia, unspecified: Secondary | ICD-10-CM | POA: Insufficient documentation

## 2023-06-02 MED ORDER — SODIUM CHLORIDE 0.9 % IV SOLN: Freq: Once | INTRAVENOUS | Status: AC

## 2023-06-02 MED ORDER — SODIUM CHLORIDE 0.9 % IV SOLN
300.0000 mg | Freq: Once | INTRAVENOUS | Status: AC
  Administered 2023-06-02: 300 mg via INTRAVENOUS
  Filled 2023-06-02: qty 300

## 2023-06-02 NOTE — Patient Instructions (Signed)
Iron Sucrose Injection What is this medication? IRON SUCROSE (EYE ern SOO krose) treats low levels of iron (iron deficiency anemia) in people with kidney disease. Iron is a mineral that plays an important role in making red blood cells, which carry oxygen from your lungs to the rest of your body. This medicine may be used for other purposes; ask your health care provider or pharmacist if you have questions. COMMON BRAND NAME(S): Venofer What should I tell my care team before I take this medication? They need to know if you have any of these conditions: Anemia not caused by low iron levels Heart disease High levels of iron in the blood Kidney disease Liver disease An unusual or allergic reaction to iron, other medications, foods, dyes, or preservatives Pregnant or trying to get pregnant Breastfeeding How should I use this medication? This medication is for infusion into a vein. It is given in a hospital or clinic setting. Talk to your care team about the use of this medication in children. While this medication may be prescribed for children as young as 2 years for selected conditions, precautions do apply. Overdosage: If you think you have taken too much of this medicine contact a poison control center or emergency room at once. NOTE: This medicine is only for you. Do not share this medicine with others. What if I miss a dose? Keep appointments for follow-up doses. It is important not to miss your dose. Call your care team if you are unable to keep an appointment. What may interact with this medication? Do not take this medication with any of the following: Deferoxamine Dimercaprol Other iron products This medication may also interact with the following: Chloramphenicol Deferasirox This list may not describe all possible interactions. Give your health care provider a list of all the medicines, herbs, non-prescription drugs, or dietary supplements you use. Also tell them if you smoke,  drink alcohol, or use illegal drugs. Some items may interact with your medicine. What should I watch for while using this medication? Visit your care team regularly. Tell your care team if your symptoms do not start to get better or if they get worse. You may need blood work done while you are taking this medication. You may need to follow a special diet. Talk to your care team. Foods that contain iron include: whole grains/cereals, dried fruits, beans, or peas, leafy green vegetables, and organ meats (liver, kidney). What side effects may I notice from receiving this medication? Side effects that you should report to your care team as soon as possible: Allergic reactions--skin rash, itching, hives, swelling of the face, lips, tongue, or throat Low blood pressure--dizziness, feeling faint or lightheaded, blurry vision Shortness of breath Side effects that usually do not require medical attention (report to your care team if they continue or are bothersome): Flushing Headache Joint pain Muscle pain Nausea Pain, redness, or irritation at injection site This list may not describe all possible side effects. Call your doctor for medical advice about side effects. You may report side effects to FDA at 1-800-FDA-1088. Where should I keep my medication? This medication is given in a hospital or clinic. It will not be stored at home. NOTE: This sheet is a summary. It may not cover all possible information. If you have questions about this medicine, talk to your doctor, pharmacist, or health care provider.  2024 Elsevier/Gold Standard (2023-01-19 00:00:00)

## 2023-06-02 NOTE — Progress Notes (Signed)
Pt declined 30 minute post observation following Venofer infusion. Pt tolerated Tx well w/out incident. Ambulatory to lobby.

## 2023-06-08 MED FILL — Iron Sucrose Inj 20 MG/ML (Fe Equiv): INTRAVENOUS | Qty: 15 | Status: AC

## 2023-06-09 ENCOUNTER — Inpatient Hospital Stay: Payer: 59

## 2023-06-09 VITALS — BP 107/71 | HR 77 | Temp 98.4°F | Resp 17

## 2023-06-09 DIAGNOSIS — D509 Iron deficiency anemia, unspecified: Secondary | ICD-10-CM | POA: Diagnosis not present

## 2023-06-09 MED ORDER — SODIUM CHLORIDE 0.9 % IV SOLN: Freq: Once | INTRAVENOUS | Status: AC

## 2023-06-09 MED ORDER — SODIUM CHLORIDE 0.9 % IV SOLN
300.0000 mg | Freq: Once | INTRAVENOUS | Status: AC
  Administered 2023-06-09: 300 mg via INTRAVENOUS
  Filled 2023-06-09: qty 300

## 2023-06-09 NOTE — Patient Instructions (Signed)
Iron Sucrose Injection What is this medication? IRON SUCROSE (EYE ern SOO krose) treats low levels of iron (iron deficiency anemia) in people with kidney disease. Iron is a mineral that plays an important role in making red blood cells, which carry oxygen from your lungs to the rest of your body. This medicine may be used for other purposes; ask your health care provider or pharmacist if you have questions. COMMON BRAND NAME(S): Venofer What should I tell my care team before I take this medication? They need to know if you have any of these conditions: Anemia not caused by low iron levels Heart disease High levels of iron in the blood Kidney disease Liver disease An unusual or allergic reaction to iron, other medications, foods, dyes, or preservatives Pregnant or trying to get pregnant Breastfeeding How should I use this medication? This medication is for infusion into a vein. It is given in a hospital or clinic setting. Talk to your care team about the use of this medication in children. While this medication may be prescribed for children as young as 2 years for selected conditions, precautions do apply. Overdosage: If you think you have taken too much of this medicine contact a poison control center or emergency room at once. NOTE: This medicine is only for you. Do not share this medicine with others. What if I miss a dose? Keep appointments for follow-up doses. It is important not to miss your dose. Call your care team if you are unable to keep an appointment. What may interact with this medication? Do not take this medication with any of the following: Deferoxamine Dimercaprol Other iron products This medication may also interact with the following: Chloramphenicol Deferasirox This list may not describe all possible interactions. Give your health care provider a list of all the medicines, herbs, non-prescription drugs, or dietary supplements you use. Also tell them if you smoke,  drink alcohol, or use illegal drugs. Some items may interact with your medicine. What should I watch for while using this medication? Visit your care team regularly. Tell your care team if your symptoms do not start to get better or if they get worse. You may need blood work done while you are taking this medication. You may need to follow a special diet. Talk to your care team. Foods that contain iron include: whole grains/cereals, dried fruits, beans, or peas, leafy green vegetables, and organ meats (liver, kidney). What side effects may I notice from receiving this medication? Side effects that you should report to your care team as soon as possible: Allergic reactions--skin rash, itching, hives, swelling of the face, lips, tongue, or throat Low blood pressure--dizziness, feeling faint or lightheaded, blurry vision Shortness of breath Side effects that usually do not require medical attention (report to your care team if they continue or are bothersome): Flushing Headache Joint pain Muscle pain Nausea Pain, redness, or irritation at injection site This list may not describe all possible side effects. Call your doctor for medical advice about side effects. You may report side effects to FDA at 1-800-FDA-1088. Where should I keep my medication? This medication is given in a hospital or clinic. It will not be stored at home. NOTE: This sheet is a summary. It may not cover all possible information. If you have questions about this medicine, talk to your doctor, pharmacist, or health care provider.  2024 Elsevier/Gold Standard (2023-01-19 00:00:00)

## 2023-07-11 ENCOUNTER — Encounter: Payer: Self-pay | Admitting: Psychiatry

## 2023-07-13 DIAGNOSIS — C73 Malignant neoplasm of thyroid gland: Secondary | ICD-10-CM | POA: Diagnosis not present

## 2023-07-13 DIAGNOSIS — E669 Obesity, unspecified: Secondary | ICD-10-CM | POA: Diagnosis not present

## 2023-07-13 DIAGNOSIS — E890 Postprocedural hypothyroidism: Secondary | ICD-10-CM | POA: Diagnosis not present

## 2023-07-13 DIAGNOSIS — Z9884 Bariatric surgery status: Secondary | ICD-10-CM | POA: Diagnosis not present

## 2023-07-13 DIAGNOSIS — D649 Anemia, unspecified: Secondary | ICD-10-CM | POA: Diagnosis not present

## 2023-07-13 DIAGNOSIS — Z23 Encounter for immunization: Secondary | ICD-10-CM | POA: Diagnosis not present

## 2023-07-13 DIAGNOSIS — E559 Vitamin D deficiency, unspecified: Secondary | ICD-10-CM | POA: Diagnosis not present

## 2023-07-18 DIAGNOSIS — F419 Anxiety disorder, unspecified: Secondary | ICD-10-CM | POA: Diagnosis not present

## 2023-07-18 DIAGNOSIS — K219 Gastro-esophageal reflux disease without esophagitis: Secondary | ICD-10-CM | POA: Diagnosis not present

## 2023-07-18 DIAGNOSIS — E039 Hypothyroidism, unspecified: Secondary | ICD-10-CM | POA: Diagnosis not present

## 2023-07-18 DIAGNOSIS — I10 Essential (primary) hypertension: Secondary | ICD-10-CM | POA: Diagnosis not present

## 2023-08-10 ENCOUNTER — Inpatient Hospital Stay: Payer: Medicaid Other | Attending: Hematology and Oncology

## 2023-08-10 DIAGNOSIS — D509 Iron deficiency anemia, unspecified: Secondary | ICD-10-CM | POA: Insufficient documentation

## 2023-08-10 DIAGNOSIS — E538 Deficiency of other specified B group vitamins: Secondary | ICD-10-CM | POA: Insufficient documentation

## 2023-08-10 DIAGNOSIS — Z9884 Bariatric surgery status: Secondary | ICD-10-CM | POA: Diagnosis not present

## 2023-08-10 DIAGNOSIS — R5383 Other fatigue: Secondary | ICD-10-CM

## 2023-08-10 LAB — CBC WITH DIFFERENTIAL (CANCER CENTER ONLY)
Abs Immature Granulocytes: 0 10*3/uL (ref 0.00–0.07)
Basophils Absolute: 0 10*3/uL (ref 0.0–0.1)
Basophils Relative: 1 %
Eosinophils Absolute: 0.1 10*3/uL (ref 0.0–0.5)
Eosinophils Relative: 1 %
HCT: 43.4 % (ref 36.0–46.0)
Hemoglobin: 14.5 g/dL (ref 12.0–15.0)
Immature Granulocytes: 0 %
Lymphocytes Relative: 36 %
Lymphs Abs: 2 10*3/uL (ref 0.7–4.0)
MCH: 30.1 pg (ref 26.0–34.0)
MCHC: 33.4 g/dL (ref 30.0–36.0)
MCV: 90 fL (ref 80.0–100.0)
Monocytes Absolute: 0.3 10*3/uL (ref 0.1–1.0)
Monocytes Relative: 6 %
Neutro Abs: 3.1 10*3/uL (ref 1.7–7.7)
Neutrophils Relative %: 56 %
Platelet Count: 300 10*3/uL (ref 150–400)
RBC: 4.82 MIL/uL (ref 3.87–5.11)
RDW: 13.6 % (ref 11.5–15.5)
WBC Count: 5.5 10*3/uL (ref 4.0–10.5)
nRBC: 0 % (ref 0.0–0.2)

## 2023-08-10 LAB — IRON AND IRON BINDING CAPACITY (CC-WL,HP ONLY)
Iron: 106 ug/dL (ref 28–170)
Saturation Ratios: 36 % — ABNORMAL HIGH (ref 10.4–31.8)
TIBC: 293 ug/dL (ref 250–450)
UIBC: 187 ug/dL (ref 148–442)

## 2023-08-10 LAB — CMP (CANCER CENTER ONLY)
ALT: 12 U/L (ref 0–44)
AST: 13 U/L — ABNORMAL LOW (ref 15–41)
Albumin: 4.3 g/dL (ref 3.5–5.0)
Alkaline Phosphatase: 44 U/L (ref 38–126)
Anion gap: 5 (ref 5–15)
BUN: 12 mg/dL (ref 6–20)
CO2: 27 mmol/L (ref 22–32)
Calcium: 9 mg/dL (ref 8.9–10.3)
Chloride: 107 mmol/L (ref 98–111)
Creatinine: 0.58 mg/dL (ref 0.44–1.00)
GFR, Estimated: >60 mL/min (ref >60)
Glucose, Bld: 81 mg/dL (ref 70–99)
Potassium: 3.5 mmol/L (ref 3.5–5.1)
Sodium: 139 mmol/L (ref 135–145)
Total Bilirubin: 0.5 mg/dL (ref <1.2)
Total Protein: 7.1 g/dL (ref 6.5–8.1)

## 2023-08-10 LAB — VITAMIN B12: Vitamin B-12: 168 pg/mL — ABNORMAL LOW (ref 180–914)

## 2023-08-10 LAB — FERRITIN: Ferritin: 98 ng/mL (ref 11–307)

## 2023-08-11 LAB — THYROID PANEL WITH TSH
Free Thyroxine Index: 1.7 (ref 1.2–4.9)
T3 Uptake Ratio: 25 % (ref 24–39)
T4, Total: 6.6 ug/dL (ref 4.5–12.0)
TSH: 2.79 u[IU]/mL (ref 0.450–4.500)

## 2023-08-13 ENCOUNTER — Inpatient Hospital Stay (HOSPITAL_BASED_OUTPATIENT_CLINIC_OR_DEPARTMENT_OTHER): Payer: Medicaid Other | Admitting: Hematology and Oncology

## 2023-08-13 DIAGNOSIS — D519 Vitamin B12 deficiency anemia, unspecified: Secondary | ICD-10-CM | POA: Diagnosis not present

## 2023-08-13 DIAGNOSIS — D509 Iron deficiency anemia, unspecified: Secondary | ICD-10-CM

## 2023-08-13 NOTE — Progress Notes (Signed)
HEMATOLOGY-ONCOLOGY TELEPHONE VISIT PROGRESS NOTE  I connected with our patient on 08/13/23 at  8:15 AM EST by telephone and verified that I am speaking with the correct person using two identifiers.  I discussed the limitations, risks, security and privacy concerns of performing an evaluation and management service by telephone and the availability of in person appointments.  I also discussed with the patient that there may be a patient responsible charge related to this service. The patient expressed understanding and agreed to proceed.   History of Present Illness: Follow-up to discuss results of recent blood work, complains of fatigue  History of Present Illness   The patient, with a history of gastric bypass, presents with ongoing fatigue and new onset itching. She reports feeling cold, particularly in her hands and feet. Despite receiving an iron infusion in September for fatigue, she continues to feel tired. She has noticed an improvement in her energy levels since the infusion, but the fatigue persists. She has also developed a new symptom of itching, which she cannot attribute to any particular cause. She denies any changes in her skin or rash. She also reports feeling cold all the time, particularly in her hands and feet.          REVIEW OF SYSTEMS:   Constitutional: Denies fevers, chills or abnormal weight loss All other systems were reviewed with the patient and are negative. Observations/Objective:     Assessment Plan:  Microcytic anemia Most likely related to iron deficiency as result of gastric bypass surgery causing malabsorption   Lab review: 01/27/2019: Hemoglobin 11.4, MCV 78.3, RDW 15.9 05/26/2020: Hemoglobin 8.5, MCV 66.5, platelets 519, creatinine 0.6, TSH 0.79, B12 1739 09/28/20: Hb 13.1, MCV 82.2, Ferritin: 9, Iron Sat 6%, TIBC 394 01/21/2021: Hemoglobin 14.3, MCV 85, ferritin 60, iron saturation 14% 07/19/2022: Hemoglobin 9.1, ferritin 5 10/06/22: Hb 13.7, MCV 88,  Ferritin 14, Sat: 9% 04/06/2023: Hemoglobin 14.7, MCV 90.5, ferritin 21, iron saturation 17% 08/10/2023: Hemoglobin 14.5, MCV 90, iron saturation 36%, ferritin 98, B12 168   IV Iron: Oct-Nov 2021, February 2022, Nov 2023, March 2024, September 2024   Profound fatigue: Continuing issues with fatigue which get better after she receives IV iron.   With profound B12 deficiency recommended that she start on B12 replacement therapy.  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  I recommended injections for B12.  She will receive weekly x 4 followed by monthly  Skin itching: Unclear if it is cold weather related or B12 deficiency related.  Hypothyroidism: Follows with Dr. Talmage Nap.  Her TSH is now in normal range.   We will recheck lab in 3 months and telephone visit after that to discuss results.    I discussed the assessment and treatment plan with the patient. The patient was provided an opportunity to ask questions and all were answered. The patient agreed with the plan and demonstrated an understanding of the instructions. The patient was advised to call back or seek an in-person evaluation if the symptoms worsen or if the condition fails to improve as anticipated.   I provided 12 minutes of non-face-to-face time during this encounter.  This includes time for charting and coordination of care   Tamsen Meek, MD

## 2023-08-13 NOTE — Assessment & Plan Note (Signed)
Most likely related to iron deficiency as result of gastric bypass surgery causing malabsorption   Lab review: 01/27/2019: Hemoglobin 11.4, MCV 78.3, RDW 15.9 05/26/2020: Hemoglobin 8.5, MCV 66.5, platelets 519, creatinine 0.6, TSH 0.79, B12 1739 09/28/20: Hb 13.1, MCV 82.2, Ferritin: 9, Iron Sat 6%, TIBC 394 01/21/2021: Hemoglobin 14.3, MCV 85, ferritin 60, iron saturation 14% 07/19/2022: Hemoglobin 9.1, ferritin 5 10/06/22: Hb 13.7, MCV 88, Ferritin 14, Sat: 9% 04/06/2023: Hemoglobin 14.7, MCV 90.5, ferritin 21, iron saturation 17% 08/10/2023: Hemoglobin 14.5, MCV 90, iron saturation 36%, ferritin 98, B12 168   IV Iron: Oct-Nov 2021, February 2022, Nov 2023, March 2024, September 2024   Profound fatigue: Continuing issues with fatigue which get better after she receives IV iron.   With profound B12 deficiency recommended that she start on B12 replacement therapy.  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  I recommended injections for B12.  She will receive weekly x 4 followed by monthly  Hypothyroidism: Follows with Dr. Talmage Nap.   We will recheck lab in 3 months and telephone visit after that to discuss results.

## 2023-08-20 ENCOUNTER — Inpatient Hospital Stay: Payer: Medicaid Other

## 2023-08-27 ENCOUNTER — Inpatient Hospital Stay: Payer: Medicaid Other

## 2023-08-27 VITALS — BP 107/75 | HR 69 | Temp 98.1°F

## 2023-08-27 DIAGNOSIS — Z9884 Bariatric surgery status: Secondary | ICD-10-CM | POA: Diagnosis not present

## 2023-08-27 DIAGNOSIS — E538 Deficiency of other specified B group vitamins: Secondary | ICD-10-CM | POA: Diagnosis not present

## 2023-08-27 DIAGNOSIS — D509 Iron deficiency anemia, unspecified: Secondary | ICD-10-CM | POA: Diagnosis not present

## 2023-08-27 MED ORDER — CYANOCOBALAMIN 1000 MCG/ML IJ SOLN
1000.0000 ug | Freq: Once | INTRAMUSCULAR | Status: AC
  Administered 2023-08-27: 1000 ug via INTRAMUSCULAR
  Filled 2023-08-27: qty 1

## 2023-09-03 ENCOUNTER — Ambulatory Visit: Payer: 59

## 2023-09-07 ENCOUNTER — Telehealth: Payer: Self-pay | Admitting: Hematology and Oncology

## 2023-09-07 NOTE — Telephone Encounter (Signed)
 Left patient a voicemail in regards to scheduling per scheduling message sent on 09/03/2023

## 2023-09-10 ENCOUNTER — Encounter: Payer: Self-pay | Admitting: Hematology and Oncology

## 2023-09-10 ENCOUNTER — Ambulatory Visit: Payer: 59

## 2023-09-10 ENCOUNTER — Inpatient Hospital Stay: Payer: Medicaid Other | Attending: Hematology and Oncology

## 2023-09-10 VITALS — BP 124/83 | HR 88 | Temp 98.4°F | Resp 18

## 2023-09-10 DIAGNOSIS — E538 Deficiency of other specified B group vitamins: Secondary | ICD-10-CM | POA: Insufficient documentation

## 2023-09-10 DIAGNOSIS — D509 Iron deficiency anemia, unspecified: Secondary | ICD-10-CM

## 2023-09-10 MED ORDER — CYANOCOBALAMIN 1000 MCG/ML IJ SOLN
1000.0000 ug | Freq: Once | INTRAMUSCULAR | Status: AC
  Administered 2023-09-10: 1000 ug via INTRAMUSCULAR
  Filled 2023-09-10: qty 1

## 2023-09-17 ENCOUNTER — Inpatient Hospital Stay: Payer: Medicaid Other

## 2023-09-17 VITALS — BP 123/77 | HR 93 | Temp 98.4°F | Resp 18

## 2023-09-17 DIAGNOSIS — D509 Iron deficiency anemia, unspecified: Secondary | ICD-10-CM

## 2023-09-17 DIAGNOSIS — E538 Deficiency of other specified B group vitamins: Secondary | ICD-10-CM | POA: Diagnosis not present

## 2023-09-17 MED ORDER — CYANOCOBALAMIN 1000 MCG/ML IJ SOLN
1000.0000 ug | Freq: Once | INTRAMUSCULAR | Status: AC
  Administered 2023-09-17: 1000 ug via INTRAMUSCULAR
  Filled 2023-09-17: qty 1

## 2023-09-24 ENCOUNTER — Ambulatory Visit: Payer: 59

## 2023-10-08 ENCOUNTER — Ambulatory Visit: Payer: 59

## 2023-10-22 ENCOUNTER — Inpatient Hospital Stay: Payer: Medicaid Other | Attending: Hematology and Oncology

## 2023-10-22 VITALS — BP 120/82 | HR 90 | Temp 98.7°F | Resp 18

## 2023-10-22 DIAGNOSIS — E538 Deficiency of other specified B group vitamins: Secondary | ICD-10-CM | POA: Diagnosis not present

## 2023-10-22 DIAGNOSIS — D509 Iron deficiency anemia, unspecified: Secondary | ICD-10-CM

## 2023-10-22 MED ORDER — CYANOCOBALAMIN 1000 MCG/ML IJ SOLN
1000.0000 ug | Freq: Once | INTRAMUSCULAR | Status: AC
  Administered 2023-10-22: 1000 ug via INTRAMUSCULAR
  Filled 2023-10-22: qty 1

## 2023-10-25 ENCOUNTER — Ambulatory Visit
Admission: RE | Admit: 2023-10-25 | Discharge: 2023-10-25 | Disposition: A | Discharge status: Home / Self Care | Payer: Medicaid Other | Source: Ambulatory Visit | Attending: Obstetrics and Gynecology | Admitting: Obstetrics and Gynecology

## 2023-10-25 DIAGNOSIS — N6321 Unspecified lump in the left breast, upper outer quadrant: Secondary | ICD-10-CM | POA: Diagnosis not present

## 2023-10-25 DIAGNOSIS — N6320 Unspecified lump in the left breast, unspecified quadrant: Secondary | ICD-10-CM

## 2023-10-26 ENCOUNTER — Other Ambulatory Visit: Payer: Self-pay | Admitting: Obstetrics and Gynecology

## 2023-10-26 DIAGNOSIS — N6320 Unspecified lump in the left breast, unspecified quadrant: Secondary | ICD-10-CM

## 2023-11-05 ENCOUNTER — Other Ambulatory Visit: Payer: 59

## 2023-11-05 ENCOUNTER — Ambulatory Visit: Payer: 59

## 2023-11-08 ENCOUNTER — Telehealth: Payer: 59 | Admitting: Hematology and Oncology

## 2023-11-12 DIAGNOSIS — E669 Obesity, unspecified: Secondary | ICD-10-CM | POA: Diagnosis not present

## 2023-11-12 DIAGNOSIS — Z9884 Bariatric surgery status: Secondary | ICD-10-CM | POA: Diagnosis not present

## 2023-11-12 DIAGNOSIS — E890 Postprocedural hypothyroidism: Secondary | ICD-10-CM | POA: Diagnosis not present

## 2023-11-12 DIAGNOSIS — D649 Anemia, unspecified: Secondary | ICD-10-CM | POA: Diagnosis not present

## 2023-11-12 DIAGNOSIS — E559 Vitamin D deficiency, unspecified: Secondary | ICD-10-CM | POA: Diagnosis not present

## 2023-11-12 DIAGNOSIS — C73 Malignant neoplasm of thyroid gland: Secondary | ICD-10-CM | POA: Diagnosis not present

## 2023-11-19 ENCOUNTER — Inpatient Hospital Stay: Payer: 59

## 2023-11-19 ENCOUNTER — Inpatient Hospital Stay: Payer: 59 | Attending: Hematology and Oncology

## 2023-11-21 ENCOUNTER — Telehealth: Payer: Self-pay | Admitting: *Deleted

## 2023-11-21 ENCOUNTER — Inpatient Hospital Stay: Payer: 59 | Admitting: Hematology and Oncology

## 2023-11-21 NOTE — Progress Notes (Unsigned)
 HEMATOLOGY-ONCOLOGY TELEPHONE VISIT PROGRESS NOTE  I connected with our patient on 11/21/23 at  8:45 AM EDT by telephone and verified that I am speaking with the correct person using two identifiers.  I discussed the limitations, risks, security and privacy concerns of performing an evaluation and management service by telephone and the availability of in person appointments.  I also discussed with the patient that there may be a patient responsible charge related to this service. The patient expressed understanding and agreed to proceed.   History of Present Illness:  Discussed the use of AI scribe software for clinical note transcription with the patient, who gave verbal consent to proceed.  History of Present Illness     Oncology History   No history exists.    REVIEW OF SYSTEMS:   Constitutional: Denies fevers, chills or abnormal weight loss All other systems were reviewed with the patient and are negative. Observations/Objective:     Assessment Plan:  Microcytic anemia Most likely related to iron deficiency as result of gastric bypass surgery causing malabsorption   Lab review: 01/27/2019: Hemoglobin 11.4, MCV 78.3, RDW 15.9 05/26/2020: Hemoglobin 8.5, MCV 66.5, platelets 519, creatinine 0.6, TSH 0.79, B12 1739 09/28/20: Hb 13.1, MCV 82.2, Ferritin: 9, Iron Sat 6%, TIBC 394 01/21/2021: Hemoglobin 14.3, MCV 85, ferritin 60, iron saturation 14% 07/19/2022: Hemoglobin 9.1, ferritin 5 10/06/22: Hb 13.7, MCV 88, Ferritin 14, Sat: 9% 04/06/2023: Hemoglobin 14.7, MCV 90.5, ferritin 21, iron saturation 17% 08/10/2023: Hemoglobin 14.5, MCV 90, iron saturation 36%, ferritin 98, B12 168   IV Iron: Oct-Nov 2021, February 2022, Nov 2023, March 2024, September 2024   Profound fatigue: Continue with monthly B12 injections   Skin itching: Unclear if it is cold weather related or B12 deficiency related.   Hypothyroidism: Follows with Dr. Talmage Nap.  Her TSH is now in normal range.   We will  recheck lab in 3 months and telephone visit after that to discuss results. --------------------------------- Assessment and Plan Assessment & Plan       I discussed the assessment and treatment plan with the patient. The patient was provided an opportunity to ask questions and all were answered. The patient agreed with the plan and demonstrated an understanding of the instructions. The patient was advised to call back or seek an in-person evaluation if the symptoms worsen or if the condition fails to improve as anticipated.   I provided *** minutes of non-face-to-face time during this encounter.  This includes time for charting and coordination of care   Tamsen Meek, MD

## 2023-11-21 NOTE — Assessment & Plan Note (Deleted)
 Most likely related to iron deficiency as result of gastric bypass surgery causing malabsorption   Lab review: 01/27/2019: Hemoglobin 11.4, MCV 78.3, RDW 15.9 05/26/2020: Hemoglobin 8.5, MCV 66.5, platelets 519, creatinine 0.6, TSH 0.79, B12 1739 09/28/20: Hb 13.1, MCV 82.2, Ferritin: 9, Iron Sat 6%, TIBC 394 01/21/2021: Hemoglobin 14.3, MCV 85, ferritin 60, iron saturation 14% 07/19/2022: Hemoglobin 9.1, ferritin 5 10/06/22: Hb 13.7, MCV 88, Ferritin 14, Sat: 9% 04/06/2023: Hemoglobin 14.7, MCV 90.5, ferritin 21, iron saturation 17% 08/10/2023: Hemoglobin 14.5, MCV 90, iron saturation 36%, ferritin 98, B12 168   IV Iron: Oct-Nov 2021, February 2022, Nov 2023, March 2024, September 2024   Profound fatigue: Continue with monthly B12 injections   Skin itching: Unclear if it is cold weather related or B12 deficiency related.   Hypothyroidism: Follows with Dr. Talmage Nap.  Her TSH is now in normal range.   We will recheck lab in 3 months and telephone visit after that to discuss results.

## 2023-11-21 NOTE — Telephone Encounter (Signed)
 Per MD request, appt for today canceled because pt did not show for lab work on Monday.  RN attempt x1 to contact pt.  No answer.  LVM for pt to return call to the office for when she is ready to have lab and MD appt 2 days later.

## 2023-12-03 ENCOUNTER — Ambulatory Visit: Payer: 59

## 2023-12-17 ENCOUNTER — Inpatient Hospital Stay: Payer: 59 | Attending: Hematology and Oncology

## 2023-12-25 DIAGNOSIS — K219 Gastro-esophageal reflux disease without esophagitis: Secondary | ICD-10-CM | POA: Diagnosis not present

## 2023-12-25 DIAGNOSIS — F329 Major depressive disorder, single episode, unspecified: Secondary | ICD-10-CM | POA: Diagnosis not present

## 2023-12-25 DIAGNOSIS — I10 Essential (primary) hypertension: Secondary | ICD-10-CM | POA: Diagnosis not present

## 2023-12-25 DIAGNOSIS — E6609 Other obesity due to excess calories: Secondary | ICD-10-CM | POA: Diagnosis not present

## 2023-12-31 ENCOUNTER — Ambulatory Visit: Payer: 59

## 2024-01-14 ENCOUNTER — Ambulatory Visit: Payer: 59

## 2024-01-14 ENCOUNTER — Telehealth: Payer: Self-pay | Admitting: *Deleted

## 2024-01-14 NOTE — Telephone Encounter (Signed)
 Called and left message for pt to call and reschedule missed b12 appt.

## 2024-01-28 ENCOUNTER — Ambulatory Visit: Payer: 59

## 2024-02-11 ENCOUNTER — Ambulatory Visit: Payer: 59

## 2024-02-25 ENCOUNTER — Ambulatory Visit: Payer: 59

## 2024-03-04 DIAGNOSIS — E6609 Other obesity due to excess calories: Secondary | ICD-10-CM | POA: Diagnosis not present

## 2024-03-04 DIAGNOSIS — H7090 Unspecified mastoiditis, unspecified ear: Secondary | ICD-10-CM | POA: Diagnosis not present

## 2024-03-04 DIAGNOSIS — E039 Hypothyroidism, unspecified: Secondary | ICD-10-CM | POA: Diagnosis not present

## 2024-03-04 DIAGNOSIS — T50905A Adverse effect of unspecified drugs, medicaments and biological substances, initial encounter: Secondary | ICD-10-CM | POA: Diagnosis not present

## 2024-03-04 DIAGNOSIS — Z0001 Encounter for general adult medical examination with abnormal findings: Secondary | ICD-10-CM | POA: Diagnosis not present

## 2024-03-04 DIAGNOSIS — H6981 Other specified disorders of Eustachian tube, right ear: Secondary | ICD-10-CM | POA: Diagnosis not present

## 2024-03-04 DIAGNOSIS — Z9229 Personal history of other drug therapy: Secondary | ICD-10-CM | POA: Diagnosis not present

## 2024-03-04 DIAGNOSIS — Z6833 Body mass index (BMI) 33.0-33.9, adult: Secondary | ICD-10-CM | POA: Diagnosis not present

## 2024-03-04 DIAGNOSIS — I10 Essential (primary) hypertension: Secondary | ICD-10-CM | POA: Diagnosis not present

## 2024-03-04 DIAGNOSIS — K219 Gastro-esophageal reflux disease without esophagitis: Secondary | ICD-10-CM | POA: Diagnosis not present

## 2024-03-04 DIAGNOSIS — K117 Disturbances of salivary secretion: Secondary | ICD-10-CM | POA: Diagnosis not present

## 2024-03-06 DIAGNOSIS — R7989 Other specified abnormal findings of blood chemistry: Secondary | ICD-10-CM | POA: Diagnosis not present

## 2024-03-10 ENCOUNTER — Ambulatory Visit: Payer: 59 | Attending: Hematology and Oncology

## 2024-03-12 DIAGNOSIS — H7090 Unspecified mastoiditis, unspecified ear: Secondary | ICD-10-CM | POA: Diagnosis not present

## 2024-03-12 DIAGNOSIS — R519 Headache, unspecified: Secondary | ICD-10-CM | POA: Diagnosis not present

## 2024-03-14 ENCOUNTER — Encounter: Payer: Self-pay | Admitting: Advanced Practice Midwife

## 2024-03-18 ENCOUNTER — Telehealth

## 2024-03-18 DIAGNOSIS — R682 Dry mouth, unspecified: Secondary | ICD-10-CM

## 2024-03-18 DIAGNOSIS — R519 Headache, unspecified: Secondary | ICD-10-CM

## 2024-03-18 DIAGNOSIS — R5383 Other fatigue: Secondary | ICD-10-CM

## 2024-03-18 NOTE — Patient Instructions (Signed)
  [name], thank you for joining Dana Velma Lunger, PA-C for today's virtual visit.  While this provider is not your primary care provider (PCP), if your PCP is located in our provider database this encounter information will be shared with them immediately following your visit.   A [facility] MyChart account gives you access to today's visit and all your visits, tests, and labs performed at [facility]  click here if you don't have a [facility] MyChart account or go to [email or web address]  Consent: (Patient) [name] provided verbal consent for this virtual visit at the beginning of the encounter.  Current Medications:  Current Outpatient Medications:    Brexpiprazole  (REXULTI ) 0.5 MG TABS, Take 1 tablet (0.5 mg total) by mouth daily., Disp: 7 tablet, Rfl: 0   brexpiprazole  (REXULTI ) 1 MG TABS tablet, Take 1 tablet (1 mg total) by mouth daily., Disp: 28 tablet, Rfl: 0   butalbital-aspirin -caffeine (FIORINAL) 50-325-40 MG capsule, Take 1 capsule by mouth 4 (four) times daily as needed., Disp: , Rfl:    calcium citrate (CALCITRATE - DOSED IN MG ELEMENTAL CALCIUM) 950 MG tablet, Take 200 mg of elemental calcium by mouth daily., Disp: , Rfl:    Cholecalciferol (VITAMIN D3 PO), Take by mouth., Disp: , Rfl:    levothyroxine  (SYNTHROID ) 200 MCG tablet, Take 1 tablet (200 mcg total) by mouth daily before breakfast., Disp: 30 tablet, Rfl: 2   lidocaine  (XYLOCAINE ) 2 % solution, Swallow 10 ml  every 6 hours as needed for abdominal pain, Disp: 100 mL, Rfl: 0   metFORMIN (GLUCOPHAGE) 500 MG tablet, 1 tablet with a meal, Disp: , Rfl:    Multiple Vitamin (MULTIVITAMIN) tablet, Take 1 tablet by mouth daily., Disp: , Rfl:    omeprazole (PRILOSEC) 40 MG capsule, Take 40 mg by mouth daily as needed (for acid reflux). , Disp: , Rfl: 3   phentermine 15 MG capsule, Take 15 mg by mouth daily., Disp: , Rfl:    sertraline  (ZOLOFT ) 100 MG tablet, TAKE 2 TABLETS BY MOUTH  EVERY DAY, Disp: 60 tablet, Rfl: 0   topiramate  (TOPAMAX ) 100 MG tablet, Take 100 mg by mouth daily., Disp: , Rfl:    Medications ordered in this encounter:  No orders of the defined types were placed in this encounter.    *If you need refills on other medications prior to your next appointment, please contact your pharmacy*  Follow-Up: Call back or seek an in-person evaluation if the symptoms worsen or if the condition fails to improve as anticipated.  [facility] [phone]  Other Instructions   If you have been instructed to have an in-person evaluation today at a local Urgent Care facility, please use the link below. It will take you to a list of all of our available [facility], including address, phone number and hours of operation. Please do not delay care.  [facility]  If you or a family member do not have a primary care provider, use the link below to schedule a visit and establish care. When you choose a [facility] primary care physician or advanced practice provider, you gain a long-term partner in health. Find a Primary Care Provider  Learn more about [facility] in-office and virtual care options: [facility] - Get Care Now

## 2024-03-18 NOTE — Progress Notes (Signed)
 Virtual Visit Consent   [name], you are scheduled for a virtual visit with a [facility] provider today. Just as with appointments in the office, your consent must be obtained to participate. Your consent will be active for this visit and any virtual visit you may have with one of our providers in the next 365 days. If you have a MyChart account, a copy of this consent can be sent to you electronically.  As this is a virtual visit, video technology does not allow for your provider to perform a traditional examination. This may limit your provider's ability to fully assess your condition. If your provider identifies any concerns that need to be evaluated in person or the need to arrange testing (such as labs, EKG, etc.), we will make arrangements to do so. Although advances in technology are sophisticated, we cannot ensure that it will always work on either your end or our end. If the connection with a video visit is poor, the visit may have to be switched to a telephone visit. With either a video or telephone visit, we are not always able to ensure that we have a secure connection.  By engaging in this virtual visit, you consent to the provision of healthcare and authorize for your insurance to be billed (if applicable) for the services provided during this visit. Depending on your insurance coverage, you may receive a charge related to this service.  I need to obtain your verbal consent now. Are you willing to proceed with your visit today? [name] has provided verbal consent on 03/18/2024 for a virtual visit (video or telephone). [name], [place]  Date: 03/18/2024 7:59 AM   Virtual Visit via Video Note   I, [name], connected with  [name]  ([id number], 12/02/1975) on 03/18/24 at  7:45 AM EDT by a video-enabled telemedicine application and verified that I am speaking with the correct person using two identifiers.  Location: Patient:  Virtual Visit Location Patient: Home Provider: Virtual Visit Location Provider: Home Office   I discussed the limitations of evaluation and management by telemedicine and the availability of in person appointments. The patient expressed understanding and agreed to proceed.    History of Present Illness: [name] is a 48 y.o. who identifies as a female who was assigned female at birth, and is being seen today for request to have IV fluids. She thought she was scheduling with her hematologist, Dr. Gudena, for this request. Notes ongoing frequent headaches with dry mouth and substantial fatigue. Notes history of microcytic anemia, hypothyroidism. Endorses taking prescribed medications as directed.  Lab Results  Component Value Date   TSH 2.790 08/10/2023     HPI: HPI  Problems:  Patient Active Problem List   Diagnosis Date Noted   Irritable bowel syndrome 01/12/2023   Hypothyroidism 01/12/2023   Microcytic anemia 06/15/2020   Pregnancy 03/06/2017   NSVD (normal spontaneous vaginal delivery) 03/06/2017   Active labor at term 04/21/2015   Gastric bypass status for obesity 03/18/2014   Chest pain 09/18/2013   Obesity, morbid, BMI 50 or higher (HCC) 09/18/2013   CONSTIPATION 12/10/2009    Allergies: No Known Allergies Medications:  Current Outpatient Medications:    Brexpiprazole  (REXULTI ) 0.5 MG TABS, Take 1 tablet (0.5 mg total) by mouth daily., Disp: 7 tablet, Rfl: 0   brexpiprazole  (REXULTI ) 1 MG TABS tablet, Take 1 tablet (1 mg total) by mouth daily., Disp: 28 tablet, Rfl: 0   butalbital-aspirin -caffeine (FIORINAL)  50-325-40 MG capsule, Take 1 capsule by mouth 4 (four) times daily as needed., Disp: , Rfl:    calcium citrate (CALCITRATE - DOSED IN MG ELEMENTAL CALCIUM) 950 MG tablet, Take 200 mg of elemental calcium by mouth daily., Disp: , Rfl:    Cholecalciferol (VITAMIN D3 PO), Take by mouth., Disp: , Rfl:    levothyroxine  (SYNTHROID ) 200 MCG tablet, Take 1 tablet  (200 mcg total) by mouth daily before breakfast., Disp: 30 tablet, Rfl: 2   lidocaine  (XYLOCAINE ) 2 % solution, Swallow 10 ml  every 6 hours as needed for abdominal pain, Disp: 100 mL, Rfl: 0   metFORMIN (GLUCOPHAGE) 500 MG tablet, 1 tablet with a meal, Disp: , Rfl:    Multiple Vitamin (MULTIVITAMIN) tablet, Take 1 tablet by mouth daily., Disp: , Rfl:    omeprazole (PRILOSEC) 40 MG capsule, Take 40 mg by mouth daily as needed (for acid reflux). , Disp: , Rfl: 3   phentermine 15 MG capsule, Take 15 mg by mouth daily., Disp: , Rfl:    sertraline  (ZOLOFT ) 100 MG tablet, TAKE 2 TABLETS BY MOUTH EVERY DAY, Disp: 60 tablet, Rfl: 0   topiramate  (TOPAMAX ) 100 MG tablet, Take 100 mg by mouth daily., Disp: , Rfl:   Observations/Objective: Patient is well-developed, well-nourished in no acute distress.  Resting comfortably at home.  Head is normocephalic, atraumatic.  No labored breathing. Speech is clear and coherent with logical content.  Patient is alert and oriented at baseline.   Assessment and Plan: 1. Other fatigue (Primary)  2. Dry mouth  3. Daily headache  Needs in person evaluation for ongoing symptoms. She did not intend to schedule with us  and would like to follow-up with Dr. Odean regarding further assessment and IV fluids. She is reaching out to their office today. Will send copy of this note to Gudena and her PCP. She is to seek in person evaluation with PCP or UC if specialist unable to assess presently.  Follow Up Instructions: I discussed the assessment and treatment plan with the patient. The patient was provided an opportunity to ask questions and all were answered. The patient agreed with the plan and demonstrated an understanding of the instructions.  A copy of instructions were sent to the patient via MyChart unless otherwise noted below.   The patient was advised to call back or seek an in-person evaluation if the symptoms worsen or if the condition fails to improve as  anticipated.    Dana Velma Lunger, PA-C

## 2024-03-24 ENCOUNTER — Ambulatory Visit: Payer: 59

## 2024-04-07 ENCOUNTER — Inpatient Hospital Stay: Payer: 59 | Attending: Hematology and Oncology

## 2024-04-07 DIAGNOSIS — E538 Deficiency of other specified B group vitamins: Secondary | ICD-10-CM | POA: Insufficient documentation

## 2024-04-07 DIAGNOSIS — D509 Iron deficiency anemia, unspecified: Secondary | ICD-10-CM

## 2024-04-07 MED ORDER — CYANOCOBALAMIN 1000 MCG/ML IJ SOLN
1000.0000 ug | Freq: Once | INTRAMUSCULAR | Status: AC
  Administered 2024-04-07 (×2): 1000 ug via INTRAMUSCULAR
  Filled 2024-04-07: qty 1

## 2024-04-21 ENCOUNTER — Ambulatory Visit: Payer: 59

## 2024-04-24 DIAGNOSIS — R7989 Other specified abnormal findings of blood chemistry: Secondary | ICD-10-CM | POA: Diagnosis not present

## 2024-04-24 DIAGNOSIS — Z9229 Personal history of other drug therapy: Secondary | ICD-10-CM | POA: Diagnosis not present

## 2024-04-24 DIAGNOSIS — R519 Headache, unspecified: Secondary | ICD-10-CM | POA: Diagnosis not present

## 2024-04-24 DIAGNOSIS — K117 Disturbances of salivary secretion: Secondary | ICD-10-CM | POA: Diagnosis not present

## 2024-05-05 ENCOUNTER — Telehealth: Payer: Self-pay

## 2024-05-05 ENCOUNTER — Inpatient Hospital Stay: Payer: 59

## 2024-05-05 NOTE — Telephone Encounter (Signed)
 S/W pt regarding her [place]/NS to her B12 inj appt. She states she was unaware of this appt.   Pt was r/s per her request 9/11 at 1430.

## 2024-05-08 ENCOUNTER — Ambulatory Visit: Attending: Hematology and Oncology

## 2024-05-15 ENCOUNTER — Inpatient Hospital Stay: Attending: Hematology and Oncology

## 2024-05-15 DIAGNOSIS — E538 Deficiency of other specified B group vitamins: Secondary | ICD-10-CM | POA: Diagnosis present

## 2024-05-15 DIAGNOSIS — D509 Iron deficiency anemia, unspecified: Secondary | ICD-10-CM

## 2024-05-15 MED ORDER — CYANOCOBALAMIN 1000 MCG/ML IJ SOLN
1000.0000 ug | Freq: Once | INTRAMUSCULAR | Status: AC
  Administered 2024-05-15: 1000 ug via INTRAMUSCULAR
  Filled 2024-05-15: qty 1

## 2024-05-19 ENCOUNTER — Ambulatory Visit: Payer: 59

## 2024-06-02 ENCOUNTER — Ambulatory Visit: Payer: 59

## 2024-06-10 ENCOUNTER — Other Ambulatory Visit: Payer: Self-pay | Admitting: *Deleted

## 2024-06-10 ENCOUNTER — Encounter: Payer: Self-pay | Admitting: Hematology and Oncology

## 2024-06-10 DIAGNOSIS — R5383 Other fatigue: Secondary | ICD-10-CM

## 2024-06-11 ENCOUNTER — Inpatient Hospital Stay: Admitting: Hematology and Oncology

## 2024-06-16 ENCOUNTER — Inpatient Hospital Stay

## 2024-06-16 ENCOUNTER — Inpatient Hospital Stay: Payer: Self-pay | Attending: Hematology and Oncology

## 2024-06-16 ENCOUNTER — Ambulatory Visit: Payer: 59

## 2024-06-16 DIAGNOSIS — D509 Iron deficiency anemia, unspecified: Secondary | ICD-10-CM | POA: Insufficient documentation

## 2024-06-16 DIAGNOSIS — E538 Deficiency of other specified B group vitamins: Secondary | ICD-10-CM | POA: Insufficient documentation

## 2024-06-17 ENCOUNTER — Inpatient Hospital Stay

## 2024-06-17 DIAGNOSIS — D509 Iron deficiency anemia, unspecified: Secondary | ICD-10-CM | POA: Diagnosis not present

## 2024-06-17 DIAGNOSIS — D519 Vitamin B12 deficiency anemia, unspecified: Secondary | ICD-10-CM

## 2024-06-17 DIAGNOSIS — R5383 Other fatigue: Secondary | ICD-10-CM

## 2024-06-17 DIAGNOSIS — E538 Deficiency of other specified B group vitamins: Secondary | ICD-10-CM | POA: Diagnosis not present

## 2024-06-17 LAB — CMP (CANCER CENTER ONLY)
ALT: 10 U/L (ref 0–44)
AST: 14 U/L — ABNORMAL LOW (ref 15–41)
Albumin: 4.1 g/dL (ref 3.5–5.0)
Alkaline Phosphatase: 49 U/L (ref 38–126)
Anion gap: 6 (ref 5–15)
BUN: 12 mg/dL (ref 6–20)
CO2: 27 mmol/L (ref 22–32)
Calcium: 9 mg/dL (ref 8.9–10.3)
Chloride: 107 mmol/L (ref 98–111)
Creatinine: 0.64 mg/dL (ref 0.44–1.00)
GFR, Estimated: >60 mL/min (ref >60)
Glucose, Bld: 76 mg/dL (ref 70–99)
Potassium: 3.8 mmol/L (ref 3.5–5.1)
Sodium: 140 mmol/L (ref 135–145)
Total Bilirubin: 0.3 mg/dL (ref 0.0–1.2)
Total Protein: 6.9 g/dL (ref 6.5–8.1)

## 2024-06-17 LAB — CBC WITH DIFFERENTIAL (CANCER CENTER ONLY)
Abs Immature Granulocytes: 0.01 K/uL (ref 0.00–0.07)
Basophils Absolute: 0 K/uL (ref 0.0–0.1)
Basophils Relative: 1 %
Eosinophils Absolute: 0.1 K/uL (ref 0.0–0.5)
Eosinophils Relative: 3 %
HCT: 39.3 % (ref 36.0–46.0)
Hemoglobin: 12.9 g/dL (ref 12.0–15.0)
Immature Granulocytes: 0 %
Lymphocytes Relative: 38 %
Lymphs Abs: 2.1 K/uL (ref 0.7–4.0)
MCH: 27.9 pg (ref 26.0–34.0)
MCHC: 32.8 g/dL (ref 30.0–36.0)
MCV: 84.9 fL (ref 80.0–100.0)
Monocytes Absolute: 0.3 K/uL (ref 0.1–1.0)
Monocytes Relative: 5 %
Neutro Abs: 2.9 K/uL (ref 1.7–7.7)
Neutrophils Relative %: 53 %
Platelet Count: 350 K/uL (ref 150–400)
RBC: 4.63 MIL/uL (ref 3.87–5.11)
RDW: 13.7 % (ref 11.5–15.5)
WBC Count: 5.4 K/uL (ref 4.0–10.5)
nRBC: 0 % (ref 0.0–0.2)

## 2024-06-17 LAB — IRON AND IRON BINDING CAPACITY (CC-WL,HP ONLY)
Iron: 31 ug/dL (ref 28–170)
Saturation Ratios: 8 % — ABNORMAL LOW (ref 10.4–31.8)
TIBC: 371 ug/dL (ref 250–450)
UIBC: 340 ug/dL (ref 148–442)

## 2024-06-17 LAB — FERRITIN: Ferritin: 13 ng/mL (ref 11–307)

## 2024-06-17 LAB — VITAMIN B12: Vitamin B-12: 468 pg/mL (ref 180–914)

## 2024-06-17 LAB — TSH: TSH: 26.7 u[IU]/mL — ABNORMAL HIGH (ref 0.350–4.500)

## 2024-06-17 MED ORDER — CYANOCOBALAMIN 1000 MCG/ML IJ SOLN
1000.0000 ug | Freq: Once | INTRAMUSCULAR | Status: AC
  Administered 2024-06-17: 1000 ug via INTRAMUSCULAR
  Filled 2024-06-17: qty 1

## 2024-06-18 ENCOUNTER — Inpatient Hospital Stay: Admitting: Hematology and Oncology

## 2024-06-18 ENCOUNTER — Other Ambulatory Visit (HOSPITAL_COMMUNITY): Payer: Self-pay | Admitting: Hematology and Oncology

## 2024-06-18 ENCOUNTER — Telehealth: Payer: Self-pay | Admitting: Pharmacy Technician

## 2024-06-18 DIAGNOSIS — E538 Deficiency of other specified B group vitamins: Secondary | ICD-10-CM

## 2024-06-18 DIAGNOSIS — D509 Iron deficiency anemia, unspecified: Secondary | ICD-10-CM

## 2024-06-18 NOTE — Assessment & Plan Note (Signed)
 Most likely related to iron  deficiency as result of gastric bypass surgery causing malabsorption   Lab review: 01/27/2019: Hemoglobin 11.4, MCV 78.3, RDW 15.9 05/26/2020: Hemoglobin 8.5, MCV 66.5, platelets 519, creatinine 0.6, TSH 0.79, B12 1739 09/28/20: Hb 13.1, MCV 82.2, Ferritin: 9, Iron  Sat 6%, TIBC 394 01/21/2021: Hemoglobin 14.3, MCV 85, ferritin 60, iron  saturation 14% 07/19/2022: Hemoglobin 9.1, ferritin 5 10/06/22: Hb 13.7, MCV 88, Ferritin 14, Sat: 9% 04/06/2023: Hemoglobin 14.7, MCV 90.5, ferritin 21, iron  saturation 17% 08/10/2023: Hemoglobin 14.5, MCV 90, iron  saturation 36%, ferritin 98, B12 168 06/17/2024: Hemoglobin 12.9, MCV 84.9, iron  saturation 8%, B12 468, ferritin 13, TSH 26.7   IV Iron : Oct-Nov 2021, February 2022, Nov 2023, March 2024, September 2024   Profound fatigue: Continuing issues with fatigue which get better after she receives IV iron .   B12 deficiency: Monthly B12 injections Recommendation: IV iron  Hypothyroidism: Follows with Dr. Tommas.  Her TSH 26.7.  Instructed her to discuss with Dr. Balan.   We will recheck lab in 6 months and telephone visit after that to discuss results.

## 2024-06-18 NOTE — Telephone Encounter (Signed)
 Auth Submission: NO AUTH NEEDED Site of care: Site of care: [facility] Payer: [employer] Medication & CPT/J Code(s) submitted: Venofer  (Iron  Sucrose) J1756 Diagnosis Code:  Route of submission (phone, fax, portal):  Phone # Fax # Auth type: Buy/Bill PB Units/visits requested: 3 DOSES Reference number:  Approval from: 06/18/24 to 08/27/24

## 2024-06-18 NOTE — Progress Notes (Signed)
 HEMATOLOGY-ONCOLOGY TELEPHONE VISIT PROGRESS NOTE  I connected with our patient on 06/18/24 at  8:15 AM EDT by telephone and verified that I am speaking with the correct person using two identifiers.  I discussed the limitations, risks, security and privacy concerns of performing an evaluation and management service by telephone and the availability of in person appointments.  I also discussed with the patient that there may be a patient responsible charge related to this service. The patient expressed understanding and agreed to proceed.   History of Present Illness: Follow-up of iron  deficiency anemia  History of Present Illness [name] is a 48 year old female with iron  deficiency and hypothyroidism who presents with fatigue and hair loss.  She experiences fatigue and hair loss, with a recurrence of restless leg syndrome at night. Lab results indicate low iron  levels, with an iron  saturation of 8% and ferritin at 13, while hemoglobin is normal at 12.9. She previously received Venofer  infusions and is scheduled for further treatment at a new clinic.  She has hypothyroidism and takes levothyroxine  112 mcg twice daily. Her TSH level is elevated at 26, following a dose adjustment from 125 mcg to 112 mcg twice daily.  REVIEW OF SYSTEMS:   Constitutional: Denies fevers, chills or abnormal weight loss All other systems were reviewed with the patient and are negative. Observations/Objective:     Assessment Plan:  Microcytic anemia Most likely related to iron  deficiency as result of gastric bypass surgery causing malabsorption   Lab review: 01/27/2019: Hemoglobin 11.4, MCV 78.3, RDW 15.9 05/26/2020: Hemoglobin 8.5, MCV 66.5, platelets 519, creatinine 0.6, TSH 0.79, B12 1739 09/28/20: Hb 13.1, MCV 82.2, Ferritin: 9, Iron  Sat 6%, TIBC 394 01/21/2021: Hemoglobin 14.3, MCV 85, ferritin 60, iron  saturation 14% 07/19/2022: Hemoglobin 9.1, ferritin 5 10/06/22: Hb 13.7, MCV 88, Ferritin 14, Sat:  9% 04/06/2023: Hemoglobin 14.7, MCV 90.5, ferritin 21, iron  saturation 17% 08/10/2023: Hemoglobin 14.5, MCV 90, iron  saturation 36%, ferritin 98, B12 168 06/17/2024: Hemoglobin 12.9, MCV 84.9, iron  saturation 8%, B12 468, ferritin 13, TSH 26.7   IV Iron : Oct-Nov 2021, February 2022, Nov 2023, March 2024, September 2024   Profound fatigue: Continuing issues with fatigue which get better after she receives IV iron .   B12 deficiency: Monthly B12 injections Recommendation: IV iron  Hypothyroidism: Follows with Dr. Tommas.  Her TSH 26.7.  Patient will reach out to Dr. Balan.  I sent a message to Dr. Balan to adjust her medication.   We will recheck lab in 4 months and telephone visit after that to discuss results. --------------------------------- Assessment and Plan Assessment & Plan Iron  deficiency with fatigue Iron  deficiency with low iron  saturation and ferritin, normal hemoglobin. Symptoms include fatigue, hair loss, restless leg syndrome. - Arrange iron  supplementation at [facility]. - Recheck iron  levels in four months.  Vitamin B12 deficiency on intramuscular therapy Vitamin B12 levels improved with intramuscular therapy, indicating effective treatment. - Continue B12 injections at the cancer center.  Hypothyroidism with elevated TSH TSH significantly elevated, indicating insufficient thyroid  hormone replacement. Current dose inadequate, symptoms include fatigue. - Forward lab results to Dr. Tommas and request adjustment of thyroid  medication. - Advise her to contact Dr. Becki office for medication adjustment before next appointment.      I discussed the assessment and treatment plan with the patient. The patient was provided an opportunity to ask questions and all were answered. The patient agreed with the plan and demonstrated an understanding of the instructions. The patient was advised to call  back or seek an in-person evaluation if the symptoms worsen or if the  condition fails to improve as anticipated.   I provided 20 minutes of non-face-to-face time during this encounter.  This includes time for charting and coordination of care   Naomi MARLA Chad, MD

## 2024-06-24 ENCOUNTER — Other Ambulatory Visit: Payer: Self-pay | Admitting: Pharmacist

## 2024-06-24 ENCOUNTER — Telehealth: Payer: Self-pay | Admitting: *Deleted

## 2024-06-24 NOTE — Telephone Encounter (Signed)
 Received mychart message from pt stating her job will not let her off during the week to come in for IV Iron  and pt needs to be scheduled Saturday mornings at [facility].  Orders for IV iron  adjusted, message sent to scheduling team.

## 2024-06-27 MED FILL — Iron Sucrose Inj 20 MG/ML (Fe Equiv): Qty: 265 | Status: AC

## 2024-06-28 ENCOUNTER — Inpatient Hospital Stay: Attending: Hematology and Oncology

## 2024-06-28 VITALS — BP 103/76 | HR 77 | Temp 97.8°F | Resp 18

## 2024-06-28 DIAGNOSIS — D509 Iron deficiency anemia, unspecified: Secondary | ICD-10-CM | POA: Diagnosis not present

## 2024-06-28 DIAGNOSIS — E538 Deficiency of other specified B group vitamins: Secondary | ICD-10-CM | POA: Insufficient documentation

## 2024-06-28 MED ORDER — IRON SUCROSE 300 MG IVPB - SIMPLE MED
300.0000 mg | Freq: Once | Status: AC
  Administered 2024-06-28: 300 mg via INTRAVENOUS
  Filled 2024-06-28: qty 300

## 2024-06-28 MED ORDER — SODIUM CHLORIDE 0.9 % IV SOLN: INTRAVENOUS | Status: DC

## 2024-06-28 NOTE — Patient Instructions (Signed)
 Iron Sucrose Injection What is this medication? IRON SUCROSE (EYE ern SOO krose) treats low levels of iron (iron deficiency anemia) in people with kidney disease. Iron is a mineral that plays an important role in making red blood cells, which carry oxygen from your lungs to the rest of your body. This medicine may be used for other purposes; ask your health care provider or pharmacist if you have questions. COMMON BRAND NAME(S): Venofer What should I tell my care team before I take this medication? They need to know if you have any of these conditions: Anemia not caused by low iron levels Heart disease High levels of iron in the blood Kidney disease Liver disease An unusual or allergic reaction to iron, other medications, foods, dyes, or preservatives Pregnant or trying to get pregnant Breastfeeding How should I use this medication? This medication is for infusion into a vein. It is given in a hospital or clinic setting. Talk to your care team about the use of this medication in children. While this medication may be prescribed for children as young as 2 years for selected conditions, precautions do apply. Overdosage: If you think you have taken too much of this medicine contact a poison control center or emergency room at once. NOTE: This medicine is only for you. Do not share this medicine with others. What if I miss a dose? Keep appointments for follow-up doses. It is important not to miss your dose. Call your care team if you are unable to keep an appointment. What may interact with this medication? Do not take this medication with any of the following: Deferoxamine Dimercaprol Other iron products This medication may also interact with the following: Chloramphenicol Deferasirox This list may not describe all possible interactions. Give your health care provider a list of all the medicines, herbs, non-prescription drugs, or dietary supplements you use. Also tell them if you smoke,  drink alcohol, or use illegal drugs. Some items may interact with your medicine. What should I watch for while using this medication? Visit your care team regularly. Tell your care team if your symptoms do not start to get better or if they get worse. You may need blood work done while you are taking this medication. You may need to follow a special diet. Talk to your care team. Foods that contain iron include: whole grains/cereals, dried fruits, beans, or peas, leafy green vegetables, and organ meats (liver, kidney). What side effects may I notice from receiving this medication? Side effects that you should report to your care team as soon as possible: Allergic reactions--skin rash, itching, hives, swelling of the face, lips, tongue, or throat Low blood pressure--dizziness, feeling faint or lightheaded, blurry vision Shortness of breath Side effects that usually do not require medical attention (report to your care team if they continue or are bothersome): Flushing Headache Joint pain Muscle pain Nausea Pain, redness, or irritation at injection site This list may not describe all possible side effects. Call your doctor for medical advice about side effects. You may report side effects to FDA at 1-800-FDA-1088. Where should I keep my medication? This medication is given in a hospital or clinic. It will not be stored at home. NOTE: This sheet is a summary. It may not cover all possible information. If you have questions about this medicine, talk to your doctor, pharmacist, or health care provider.  2024 Elsevier/Gold Standard (2023-01-19 00:00:00)

## 2024-06-30 ENCOUNTER — Ambulatory Visit: Payer: 59

## 2024-07-04 ENCOUNTER — Other Ambulatory Visit: Payer: Self-pay | Admitting: Pharmacist

## 2024-07-04 MED FILL — Iron Sucrose Inj 20 MG/ML (Fe Equiv): Qty: 265 | Status: AC

## 2024-07-05 ENCOUNTER — Inpatient Hospital Stay

## 2024-07-05 VITALS — BP 109/81 | HR 77 | Temp 98.3°F | Resp 14

## 2024-07-05 DIAGNOSIS — D509 Iron deficiency anemia, unspecified: Secondary | ICD-10-CM

## 2024-07-05 MED ORDER — IRON SUCROSE 300 MG IVPB - SIMPLE MED
300.0000 mg | Freq: Once | Status: AC
  Administered 2024-07-05: 300 mg via INTRAVENOUS
  Filled 2024-07-05: qty 300

## 2024-07-05 MED ORDER — SODIUM CHLORIDE 0.9 % IV SOLN: INTRAVENOUS | Status: DC

## 2024-07-05 NOTE — Progress Notes (Signed)
Patient declined post iron infusion observation.  Tolerated treatment well without incident.  VSS at discharge.  Ambulated to lobby.

## 2024-07-10 DIAGNOSIS — L659 Nonscarring hair loss, unspecified: Secondary | ICD-10-CM | POA: Diagnosis not present

## 2024-07-10 DIAGNOSIS — E669 Obesity, unspecified: Secondary | ICD-10-CM | POA: Diagnosis not present

## 2024-07-10 DIAGNOSIS — Z9884 Bariatric surgery status: Secondary | ICD-10-CM | POA: Diagnosis not present

## 2024-07-10 DIAGNOSIS — D649 Anemia, unspecified: Secondary | ICD-10-CM | POA: Diagnosis not present

## 2024-07-10 DIAGNOSIS — E890 Postprocedural hypothyroidism: Secondary | ICD-10-CM | POA: Diagnosis not present

## 2024-07-10 DIAGNOSIS — E559 Vitamin D deficiency, unspecified: Secondary | ICD-10-CM | POA: Diagnosis not present

## 2024-07-10 DIAGNOSIS — R5383 Other fatigue: Secondary | ICD-10-CM | POA: Diagnosis not present

## 2024-07-11 MED FILL — Iron Sucrose Inj 20 MG/ML (Fe Equiv): Qty: 265 | Status: AC

## 2024-07-12 ENCOUNTER — Inpatient Hospital Stay

## 2024-07-12 VITALS — BP 108/71 | HR 84 | Temp 97.0°F | Resp 20

## 2024-07-12 DIAGNOSIS — D509 Iron deficiency anemia, unspecified: Secondary | ICD-10-CM | POA: Diagnosis not present

## 2024-07-12 MED ORDER — IRON SUCROSE 300 MG IVPB - SIMPLE MED
300.0000 mg | Freq: Once | Status: AC
  Administered 2024-07-12: 300 mg via INTRAVENOUS
  Filled 2024-07-12: qty 300

## 2024-07-12 MED ORDER — SODIUM CHLORIDE 0.9 % IV SOLN: INTRAVENOUS | Status: DC

## 2024-07-12 NOTE — Patient Instructions (Signed)

## 2024-07-14 ENCOUNTER — Ambulatory Visit: Payer: 59

## 2024-07-17 DIAGNOSIS — D649 Anemia, unspecified: Secondary | ICD-10-CM | POA: Diagnosis not present

## 2024-07-17 DIAGNOSIS — E890 Postprocedural hypothyroidism: Secondary | ICD-10-CM | POA: Diagnosis not present

## 2024-07-17 DIAGNOSIS — Z9884 Bariatric surgery status: Secondary | ICD-10-CM | POA: Diagnosis not present

## 2024-07-17 DIAGNOSIS — E669 Obesity, unspecified: Secondary | ICD-10-CM | POA: Diagnosis not present

## 2024-07-17 DIAGNOSIS — E559 Vitamin D deficiency, unspecified: Secondary | ICD-10-CM | POA: Diagnosis not present

## 2024-07-17 DIAGNOSIS — R7301 Impaired fasting glucose: Secondary | ICD-10-CM | POA: Diagnosis not present

## 2024-07-17 DIAGNOSIS — C73 Malignant neoplasm of thyroid gland: Secondary | ICD-10-CM | POA: Diagnosis not present

## 2024-07-21 ENCOUNTER — Inpatient Hospital Stay: Payer: Self-pay

## 2024-07-21 DIAGNOSIS — D509 Iron deficiency anemia, unspecified: Secondary | ICD-10-CM

## 2024-07-21 MED ORDER — CYANOCOBALAMIN 1000 MCG/ML IJ SOLN
1000.0000 ug | Freq: Once | INTRAMUSCULAR | Status: AC
  Administered 2024-07-21: 1000 ug via INTRAMUSCULAR
  Filled 2024-07-21: qty 1

## 2024-07-28 ENCOUNTER — Ambulatory Visit: Payer: 59

## 2024-08-04 ENCOUNTER — Emergency Department (HOSPITAL_BASED_OUTPATIENT_CLINIC_OR_DEPARTMENT_OTHER)
Admission: EM | Admit: 2024-08-04 | Discharge: 2024-08-04 | Disposition: A | Discharge status: Home / Self Care | Attending: Emergency Medicine | Admitting: Emergency Medicine

## 2024-08-04 ENCOUNTER — Encounter: Payer: Self-pay | Admitting: Hematology and Oncology

## 2024-08-04 ENCOUNTER — Emergency Department (HOSPITAL_BASED_OUTPATIENT_CLINIC_OR_DEPARTMENT_OTHER)

## 2024-08-04 ENCOUNTER — Encounter (HOSPITAL_BASED_OUTPATIENT_CLINIC_OR_DEPARTMENT_OTHER): Payer: Self-pay

## 2024-08-04 ENCOUNTER — Emergency Department (HOSPITAL_BASED_OUTPATIENT_CLINIC_OR_DEPARTMENT_OTHER): Admitting: Radiology

## 2024-08-04 ENCOUNTER — Other Ambulatory Visit: Payer: Self-pay

## 2024-08-04 DIAGNOSIS — R55 Syncope and collapse: Secondary | ICD-10-CM

## 2024-08-04 DIAGNOSIS — S298XXA Other specified injuries of thorax, initial encounter: Secondary | ICD-10-CM

## 2024-08-04 DIAGNOSIS — S129XXA Fracture of neck, unspecified, initial encounter: Secondary | ICD-10-CM

## 2024-08-04 DIAGNOSIS — S12390A Other displaced fracture of fourth cervical vertebra, initial encounter for closed fracture: Secondary | ICD-10-CM | POA: Diagnosis not present

## 2024-08-04 DIAGNOSIS — S20212A Contusion of left front wall of thorax, initial encounter: Secondary | ICD-10-CM | POA: Diagnosis not present

## 2024-08-04 LAB — CBC WITH DIFFERENTIAL/PLATELET
Abs Immature Granulocytes: 0.02 K/uL (ref 0.00–0.07)
Basophils Absolute: 0.1 K/uL (ref 0.0–0.1)
Basophils Relative: 1 %
Eosinophils Absolute: 0.1 K/uL (ref 0.0–0.5)
Eosinophils Relative: 1 %
HCT: 41.9 % (ref 36.0–46.0)
Hemoglobin: 14 g/dL (ref 12.0–15.0)
Immature Granulocytes: 0 %
Lymphocytes Relative: 29 %
Lymphs Abs: 2.2 K/uL (ref 0.7–4.0)
MCH: 28.7 pg (ref 26.0–34.0)
MCHC: 33.4 g/dL (ref 30.0–36.0)
MCV: 86 fL (ref 80.0–100.0)
Monocytes Absolute: 0.5 K/uL (ref 0.1–1.0)
Monocytes Relative: 6 %
Neutro Abs: 4.9 K/uL (ref 1.7–7.7)
Neutrophils Relative %: 63 %
Platelets: 308 K/uL (ref 150–400)
RBC: 4.87 MIL/uL (ref 3.87–5.11)
RDW: 15 % (ref 11.5–15.5)
WBC: 7.7 K/uL (ref 4.0–10.5)
nRBC: 0 % (ref 0.0–0.2)

## 2024-08-04 LAB — URINALYSIS, ROUTINE W REFLEX MICROSCOPIC
Bilirubin Urine: NEGATIVE
Glucose, UA: NEGATIVE mg/dL
Hgb urine dipstick: NEGATIVE
Ketones, ur: NEGATIVE mg/dL
Leukocytes,Ua: NEGATIVE
Nitrite: NEGATIVE
Protein, ur: NEGATIVE mg/dL
Specific Gravity, Urine: 1.01 (ref 1.005–1.030)
pH: 5.5 (ref 5.0–8.0)

## 2024-08-04 LAB — BASIC METABOLIC PANEL WITH GFR
Anion gap: 10 (ref 5–15)
BUN: 11 mg/dL (ref 6–20)
CO2: 24 mmol/L (ref 22–32)
Calcium: 9.4 mg/dL (ref 8.9–10.3)
Chloride: 104 mmol/L (ref 98–111)
Creatinine, Ser: 0.67 mg/dL (ref 0.44–1.00)
GFR, Estimated: >60 mL/min (ref >60)
Glucose, Bld: 87 mg/dL (ref 70–99)
Potassium: 4.1 mmol/L (ref 3.5–5.1)
Sodium: 137 mmol/L (ref 135–145)

## 2024-08-04 LAB — PREGNANCY, URINE: Preg Test, Ur: NEGATIVE

## 2024-08-04 MED ORDER — OXYCODONE HCL 5 MG PO TABS
5.0000 mg | ORAL_TABLET | Freq: Once | ORAL | Status: AC
  Administered 2024-08-04: 5 mg via ORAL
  Filled 2024-08-04: qty 1

## 2024-08-04 MED ORDER — OXYCODONE HCL 5 MG PO TABS: 5.0000 mg | ORAL_TABLET | Freq: Four times a day (QID) | ORAL | 0 refills | Status: AC | PRN

## 2024-08-04 MED ORDER — SODIUM CHLORIDE 0.9 % IV BOLUS
1000.0000 mL | Freq: Once | INTRAVENOUS | Status: AC
  Administered 2024-08-04: 1000 mL via INTRAVENOUS

## 2024-08-04 MED ORDER — KETOROLAC TROMETHAMINE 15 MG/ML IJ SOLN: 15.0000 mg | Freq: Once | INTRAMUSCULAR | Status: DC

## 2024-08-04 MED ORDER — METHOCARBAMOL 500 MG PO TABS: 1000.0000 mg | ORAL_TABLET | Freq: Three times a day (TID) | ORAL | 0 refills | Status: AC | PRN

## 2024-08-04 MED ORDER — METHOCARBAMOL 500 MG PO TABS
500.0000 mg | ORAL_TABLET | Freq: Once | ORAL | Status: AC
  Administered 2024-08-04: 500 mg via ORAL
  Filled 2024-08-04: qty 1

## 2024-08-04 MED ORDER — LIDOCAINE 5 % EX PTCH
1.0000 | MEDICATED_PATCH | Freq: Once | CUTANEOUS | Status: DC
  Administered 2024-08-04: 1 via TRANSDERMAL
  Filled 2024-08-04: qty 1

## 2024-08-04 NOTE — Discharge Instructions (Signed)
 Please read and follow all provided instructions.  Your diagnoses today include:  1. Syncope, unspecified syncope type   2. Closed fracture of cervical vertebral body (HCC)     Tests performed today include: CT scan of your neck showed a fourth cervical vertebrae fracture X-ray of the chest and ribs did not show any signs of rib fracture or underlying lung injury Complete blood cell count: Normal white and red blood cells Basic metabolic panel: Normal electrolytes and kidney function Urinalysis (urine test): No sign of severe dehydration or infection Pregnancy test (urine or blood, in women only): Negative Vital signs. See below for your results today.   Medications prescribed:  Oxycodone  - narcotic pain medication  DO NOT drive or perform any activities that require you to be awake and alert because this medicine can make you drowsy.   Robaxin  (methocarbamol ) - muscle relaxer medication  DO NOT drive or perform any activities that require you to be awake and alert because this medicine can make you drowsy.   Please use over-the-counter NSAID medications (ibuprofen , naproxen) or Tylenol  (acetaminophen ) as directed on the packaging for pain -- as long as you do not have any reasons avoid these medications. Reasons to avoid NSAID medications include: weak kidneys, a history of bleeding in your stomach or gut, or uncontrolled high blood pressure or previous heart attack. Reasons to avoid Tylenol  include: liver problems or ongoing alcohol use. Never take more than 4000mg  or 8 Extra strength Tylenol  in a 24 hour period.     Take any prescribed medications only as directed.  Home care instructions:  Follow any educational materials contained in this packet.  Please use the hard collar at all times except for when in the shower or when in bed (per neurosurgery recommendations).   Take 10 deep breaths every hour to help keep your lungs inflated and to prevent pneumonia.  Follow-up  instructions: Please follow-up with your primary care provider in the next 7 days for further evaluation of your passing out episode.  Please call the neurosurgeon tomorrow to schedule an outpatient follow-up appointment.  Return instructions:  SEEK IMMEDIATE MEDICAL ATTENTION IF: There is confusion or drowsiness (although children frequently become drowsy after injury).  You cannot awaken the injured person.  You have more than one episode of vomiting.  You notice dizziness or unsteadiness which is getting worse, or inability to walk.  You have convulsions or unconsciousness.  You experience severe, persistent headaches not relieved by Tylenol . You cannot use arms or legs normally.  There are changes in pupil sizes. (This is the black center in the colored part of the eye)  There is clear or bloody discharge from the nose or ears.  You have change in speech, vision, swallowing, or understanding.  Localized weakness, numbness, tingling, or change in bowel or bladder control. You have any other emergent concerns.  Your vital signs today were: BP 117/83   Pulse 67   Temp 98.3 F (36.8 C)   Resp 18   SpO2 100%  If your blood pressure (BP) was elevated above 135/85 this visit, please have this repeated by your doctor within one month. --------------

## 2024-08-04 NOTE — ED Provider Notes (Signed)
 [place] EMERGENCY DEPARTMENT AT [facility] Provider Note   CSN: [id number] Arrival date & time: 08/04/24  1316     Patient presents with: Loss of Consciousness   [name] is a 48 y.o. female.   Patient with history of hypothyroidism presents to the emergency department today for evaluation of syncope and left chest and neck pain.  Patient awoke around 5 AM today, in her usual state of health.  She states that she walked to her bathroom, urinated, was going to brush her teeth.  She had a syncopal spell without warning.  She was feeling sleepy, but denies any associated chest pain or shortness of breath.  No headache or strokelike symptoms.  Husband was awake at the time, heard the patient fall.  He initially thought that she dropped something but then he heard her calling out for him, a few moments later.  He tried to help her get up but she was too weak.  He called EMS and reports blood pressure was a bit low, but she declined transport at that time.  She has been in bed since this incident.  She has pain in her left anterior chest which radiates through to her back.  This is worse with movement and deep breathing.  She also has pain up into her left neck.  She denies weakness, numbness, or tingling in her extremities.  No vomiting.  No headache or fever.  No infectious type symptoms.  She denies treatments prior to arrival.  Patient denies risk factors for pulmonary embolism including: unilateral leg swelling, history of DVT/PE/other blood clots, use of exogenous hormones, recent immobilizations, recent surgery, recent travel (>4hr segment), malignancy, hemoptysis.          Prior to Admission medications   Medication Sig Start Date End Date Taking? Authorizing Provider  Brexpiprazole  (REXULTI ) 0.5 MG TABS Take 1 tablet (0.5 mg total) by mouth daily. 11/17/21   Franchot Harlene SQUIBB, PMHNP  brexpiprazole  (REXULTI ) 1 MG TABS tablet Take 1 tablet (1 mg total) by mouth  daily. 11/24/21   Franchot Harlene SQUIBB, PMHNP  butalbital-aspirin -caffeine (FIORINAL) 50-325-40 MG capsule Take 1 capsule by mouth 4 (four) times daily as needed. 05/20/20   [provider]  calcium citrate (CALCITRATE - DOSED IN MG ELEMENTAL CALCIUM) 950 MG tablet Take 200 mg of elemental calcium by mouth daily.    [provider]  Cholecalciferol (VITAMIN D3 PO) Take by mouth.    [provider]  levothyroxine  (SYNTHROID ) 200 MCG tablet Take 1 tablet (200 mcg total) by mouth daily before breakfast. 01/12/23   Causey, Morna Pickle, NP  lidocaine  (XYLOCAINE ) 2 % solution Swallow 10 ml  every 6 hours as needed for abdominal pain 06/03/22   Harris, Abigail, PA-C  metFORMIN (GLUCOPHAGE) 500 MG tablet 1 tablet with a meal    [provider]  Multiple Vitamin (MULTIVITAMIN) tablet Take 1 tablet by mouth daily.    [provider]  omeprazole (PRILOSEC) 40 MG capsule Take 40 mg by mouth daily as needed (for acid reflux).     [provider]  phentermine 15 MG capsule Take 15 mg by mouth daily. 03/22/21   [provider]  sertraline  (ZOLOFT ) 100 MG tablet TAKE 2 TABLETS BY MOUTH EVERY DAY 08/17/22   Franchot Harlene SQUIBB, PMHNP  topiramate  (TOPAMAX ) 100 MG tablet Take 100 mg by mouth daily. 09/04/21   [provider]    Allergies: Patient has no known allergies.    Review of Systems  Updated Vital Signs BP 115/84 (BP Location: Right Arm)   Pulse (!) 107   Temp 98.3 F (36.8 C)   Resp 16   SpO2 99%   Physical Exam Vitals and nursing note reviewed.  Constitutional:      Appearance: She is well-developed. She is not diaphoretic.  HENT:     Head: Normocephalic and atraumatic.     Mouth/Throat:     Mouth: Mucous membranes are not dry.  Eyes:     Conjunctiva/sclera: Conjunctivae normal.  Neck:     Vascular: Normal carotid pulses. No JVD.     Trachea: Trachea normal. No tracheal deviation.  Cardiovascular:     Rate and Rhythm:  Normal rate and regular rhythm.     Pulses: No decreased pulses.          Radial pulses are 2+ on the right side and 2+ on the left side.     Heart sounds: Normal heart sounds, S1 normal and S2 normal. No murmur heard. Pulmonary:     Effort: Pulmonary effort is normal. No respiratory distress.     Breath sounds: No wheezing.  Chest:     Chest wall: Tenderness present.    Abdominal:     General: Bowel sounds are normal.     Palpations: Abdomen is soft.     Tenderness: There is no abdominal tenderness. There is no guarding or rebound.  Musculoskeletal:        General: Normal range of motion.     Right shoulder: No tenderness or bony tenderness. Normal range of motion.     Left shoulder: Tenderness present. No bony tenderness. Normal range of motion.     Cervical back: Normal range of motion and neck supple. Tenderness present. No muscular tenderness. Normal range of motion.     Thoracic back: Tenderness present.     Lumbar back: No tenderness.       Back:  Skin:    General: Skin is warm and dry.     Coloration: Skin is not pale.  Neurological:     Mental Status: She is alert.     (all labs ordered are listed, but only abnormal results are displayed) Labs Reviewed  CBC WITH DIFFERENTIAL/PLATELET  BASIC METABOLIC PANEL WITH GFR  URINALYSIS, ROUTINE W REFLEX MICROSCOPIC  PREGNANCY, URINE    ED ECG REPORT   Date: 08/04/2024  Rate: 70  Rhythm: normal sinus rhythm  QRS Axis: normal  Intervals: normal  ST/T Wave abnormalities: normal  Conduction Disutrbances:none  Narrative Interpretation:   Old EKG Reviewed: unchanged from 05/2022  I have personally reviewed the EKG tracing and agree with the computerized printout as noted.   Radiology: DG Ribs Unilateral W/Chest Left Result Date: 08/04/2024 CLINICAL DATA:  Syncopal episode with fall.  Left rib pain. EXAM: LEFT RIBS AND CHEST - 3+ VIEW COMPARISON:  Chest radiographs 06/03/2022 and 10/26/2020. FINDINGS: The heart size  and mediastinal contours are normal. The lungs are clear. There is no pleural effusion or pneumothorax. No acute osseous findings are identified. Specifically, no evidence of acute left-sided rib fracture or focal rib lesion. Metallic BB placed over the area of pain anteriorly in the mid left chest. Postsurgical changes from previous thyroidectomy. Additional postsurgical changes in the upper abdomen. IMPRESSION: No evidence of acute left-sided rib fracture, pleural effusion or pneumothorax. Electronically Signed   By: Elsie Perone M.D.   On: 08/04/2024 15:19   CT Cervical Spine Wo Contrast Result Date: 08/04/2024 CLINICAL DATA:  Syncopal episode with  fall in bathroom this morning. Neck pain. EXAM: CT CERVICAL SPINE WITHOUT CONTRAST TECHNIQUE: Multidetector CT imaging of the cervical spine was performed without intravenous contrast. Multiplanar CT image reconstructions were also generated. RADIATION DOSE REDUCTION: This exam was performed according to the departmental dose-optimization program which includes automated exposure control, adjustment of the mA and/or kV according to patient size and/or use of iterative reconstruction technique. COMPARISON:  Chest CTA 03/17/2020.  Neck CT 08/19/2010. FINDINGS: Alignment: Straightening without focal angulation or listhesis. Skull base and vertebrae: There is a new, acute appearing and mildly displaced fracture involving the anterior inferior corner of the C4 vertebral body, best seen on the sagittal images. No other acute fractures are identified. There is no traumatic subluxation. Soft tissues and spinal canal: No prevertebral fluid or swelling. No visible canal hematoma. Disc levels: Progressive spondylosis with disc space narrowing, endplate osteophytes and uncinate spurring at C5-6 and C6-7. Resulting mild foraminal narrowing at both levels. No large disc herniation or high-grade spinal stenosis. Upper chest: Clear lung apices. Postsurgical changes from previous  thyroidectomy. Other: None. IMPRESSION: 1. Acute, mildly displaced fracture involving the anterior inferior corner of the C4 vertebral body, most consistent with an extension teardrop fracture. These fractures are unstable in extension. Immobilization and spine surgery consultation recommended. 2. No other acute osseous findings or malalignment. 3. Progressive spondylosis at C5-6 and C6-7. 4. Critical Value/emergent results were called by telephone at the time of interpretation on 08/04/2024 at 3:11 pm to provider [facility] , who verbally acknowledged these results. Electronically Signed   By: Elsie Perone M.D.   On: 08/04/2024 15:13     Procedures   Medications Ordered in the ED  lidocaine  (LIDODERM ) 5 % 1 patch (1 patch Transdermal Patch Applied 08/04/24 1546)  sodium chloride  0.9 % bolus 1,000 mL (1,000 mLs Intravenous New Bag/Given 08/04/24 1422)   ED Course  Patient seen and examined. History obtained directly from patient.   Labs/EKG: Ordered CBC, BMP, pregnancy, UA.  EKG personally reviewed and interpreted, no concerning findings.  Imaging: Ordered chest x-ray with rib detail, CT cervical spine  Medications/Fluids: None ordered, patient excepts medication for pain if it does not make her drowsy, will plan to give Toradol  if chest x-ray is clear without signs of pulmonary injury.  Most recent vital signs reviewed and are as follows: BP 115/84 (BP Location: Right Arm)   Pulse (!) 107   Temp 98.3 F (36.8 C)   Resp 16   SpO2 99%   Initial impression: Syncope, fall, resultant left-sided chest and back pain.  No headache or head trauma reported. Do not feel patient needs head CT at this time.   3:17 PM discussed CT results with radiologist.  Given C4 fracture, [place] J collar ordered and RN made aware of order.  I updated the patient.  Patient continues to not have any weakness, numbness, tingling in the arms of the legs.  She reports pain all over the mid to lower  neck.  Discussed case with Dr. Freddi.   Labs personally reviewed and interpreted including: CBC was unremarkable; BMP unremarkable.  Imaging personally visualized and interpreted including: Chest x-ray, no evidence of acute left-sided rib fracture, pleural effusion or pneumothorax; CT cervical spine imaging with anterior inferior C4 vertebral body fracture consistent with an extension teardrop fracture.  Reviewed pertinent lab work and imaging with patient at bedside. Questions answered.   Most current vital signs reviewed and are as follows: BP 115/84 (BP Location: Right Arm)   Pulse ROLLEN)  107   Temp 98.3 F (36.8 C)   Resp 16   SpO2 99%   Plan: Awaiting neurosurgery page for recommendations on the cervical spine x-rays.  3:58 PM Discussed with Dr. Cabbell by phone.  He states that he does not see any prevertebral swelling on CT scanning and patient needs to wear the collar but would not need in the shower or in bed.  He requested the patient call the office for an appointment.  No other recommendations.  4:28 PM visualized patient up to restroom without any difficulties.  Updated patient and family on discussion with neurosurgeon.  5:07 PM Reassessment performed. Patient appears stable.  We discussed c-collar precautions.  Will give spirometer for home.  Labs personally reviewed and interpreted including: UA without signs of infection, negative pregnancy.  Reviewed pertinent lab work and imaging with patient at bedside. Questions answered.   Most current vital signs reviewed and are as follows: BP 117/83   Pulse 67   Temp 98.3 F (36.8 C)   Resp 18   SpO2 100%   Plan: Discharge to home.   Prescriptions written for: Oxycodone  #10 tablets, methocarbamol .  Other home care instructions discussed: Use of c-collar, use of incentive spirometer  ED return instructions discussed: Return with development of worsening or new chest pain, shortness of breath, headache, additional  episodes of syncope.  Follow-up instructions discussed: Patient encouraged to follow-up with their PCP in 7 days.                                    Medical Decision Making Amount and/or Complexity of Data Reviewed Labs: ordered. Radiology: ordered.  Risk Prescription drug management.   Syncopal episode: Occurred just after waking up, no significant prodrome.  No associated headache or chest pain prior to the incident.  Patient's workup here including basic lab work and EKG is reassuring.  No cardiac abnormalities or arrhythmias noted.  No signs of Brugada syndrome, WPW, prolonged QT, heart block.  Patient does not seem to be significantly dehydrated.  Vital signs have been normal here.  Low risk syncope, but would like her to follow-up with PCP for further evaluation.  C4 fracture: Neck pain started after fall.  Patient denies severe headache.  She has a normal neurologic exam.  No signs of closed head injury at this time.  No neurologic decompensation during ED stay.  Neg canadian head CT rules.  Patient placed in rigid cervical collar.  Discussed with neurosurgery, obtain follow-up.  Left rib pain: Reproducible with palpation, deep breathing.  No other concerning symptoms for PE/DVT.  Normal heart rate.  History strongly suggestive of rib contusion.  Chest x-ray is negative.  No pulmonary contusion or pneumothorax noted.  Will continue to treat symptoms.  The patient's vital signs, pertinent lab work and imaging were reviewed and interpreted as discussed in the ED course. Hospitalization was considered for further testing, treatments, or serial exams/observation. However as patient is well-appearing, has a stable exam, and reassuring studies today, I do not feel that they warrant admission at this time. This plan was discussed with the patient who verbalizes agreement and comfort with this plan and seems reliable and able to return to the Emergency Department with worsening or changing  symptoms.       Final diagnoses:  Syncope, unspecified syncope type  Closed fracture of cervical vertebral body (HCC)  Rib contusion, left, initial encounter  ED Discharge Orders          Ordered    oxyCODONE  (OXY IR/ROXICODONE ) 5 MG immediate release tablet  Every 6 hours PRN        08/04/24 1707    methocarbamol  (ROBAXIN ) 500 MG tablet  Every 8 hours PRN        08/04/24 1707               Desiderio Chew, PA-C 08/04/24 1711    Freddi Hamilton, MD 08/04/24 2129

## 2024-08-04 NOTE — ED Notes (Signed)
 [place] J C collar placed by this tech and Franne SHAUNNA PEAK

## 2024-08-04 NOTE — ED Triage Notes (Addendum)
 Syncopal episode with fall in bathroom this morning at approx 0500 this today. Husband attempted to assist her up and her legs were too weak. Evaluated by FD/EMS without transport. Left sided chest pain that radiates to left back and neck pain.

## 2024-08-11 ENCOUNTER — Ambulatory Visit: Payer: 59

## 2024-08-12 ENCOUNTER — Other Ambulatory Visit (HOSPITAL_COMMUNITY): Payer: Self-pay | Admitting: Neurosurgery

## 2024-08-12 DIAGNOSIS — S12300A Unspecified displaced fracture of fourth cervical vertebra, initial encounter for closed fracture: Secondary | ICD-10-CM

## 2024-08-12 DIAGNOSIS — S299XXA Unspecified injury of thorax, initial encounter: Secondary | ICD-10-CM

## 2024-08-15 ENCOUNTER — Ambulatory Visit (HOSPITAL_COMMUNITY)
Admission: RE | Admit: 2024-08-15 | Discharge: 2024-08-15 | Disposition: A | Discharge status: Home / Self Care | Source: Ambulatory Visit | Attending: Neurosurgery | Admitting: Neurosurgery

## 2024-08-15 ENCOUNTER — Ambulatory Visit (HOSPITAL_COMMUNITY)

## 2024-08-15 DIAGNOSIS — S299XXA Unspecified injury of thorax, initial encounter: Secondary | ICD-10-CM | POA: Diagnosis present

## 2024-08-15 DIAGNOSIS — S12300A Unspecified displaced fracture of fourth cervical vertebra, initial encounter for closed fracture: Secondary | ICD-10-CM | POA: Diagnosis present

## 2024-08-25 ENCOUNTER — Inpatient Hospital Stay: Payer: Self-pay | Attending: Hematology and Oncology

## 2024-09-08 ENCOUNTER — Encounter: Payer: Self-pay | Admitting: Hematology and Oncology

## 2024-09-08 ENCOUNTER — Inpatient Hospital Stay: Attending: Hematology and Oncology

## 2024-09-09 ENCOUNTER — Other Ambulatory Visit (HOSPITAL_BASED_OUTPATIENT_CLINIC_OR_DEPARTMENT_OTHER): Payer: Self-pay | Admitting: Internal Medicine

## 2024-09-09 DIAGNOSIS — R55 Syncope and collapse: Secondary | ICD-10-CM

## 2024-09-23 ENCOUNTER — Encounter: Payer: Self-pay | Admitting: Hematology and Oncology

## 2024-09-23 ENCOUNTER — Other Ambulatory Visit (HOSPITAL_BASED_OUTPATIENT_CLINIC_OR_DEPARTMENT_OTHER): Payer: Self-pay | Admitting: Internal Medicine

## 2024-09-23 DIAGNOSIS — R55 Syncope and collapse: Secondary | ICD-10-CM

## 2024-09-29 ENCOUNTER — Ambulatory Visit: Payer: Self-pay

## 2024-09-30 ENCOUNTER — Encounter: Payer: Self-pay | Admitting: Hematology and Oncology

## 2024-10-01 ENCOUNTER — Telehealth: Payer: Self-pay | Admitting: Hematology and Oncology

## 2024-10-01 NOTE — Telephone Encounter (Signed)
 I LEFT VOICEMAIL FOR PATIENT REGARDING RESCHEDULED TELEPHONE MD APPOINTMENT FROM 10/22/2024 TO 10/28/2024 DUE TO MD BEING ON PAL.

## 2024-10-02 ENCOUNTER — Ambulatory Visit (HOSPITAL_BASED_OUTPATIENT_CLINIC_OR_DEPARTMENT_OTHER)
Admission: RE | Admit: 2024-10-02 | Discharge: 2024-10-02 | Disposition: A | Discharge status: Home / Self Care | Source: Ambulatory Visit | Attending: Internal Medicine | Admitting: Internal Medicine

## 2024-10-02 DIAGNOSIS — R55 Syncope and collapse: Secondary | ICD-10-CM

## 2024-10-09 ENCOUNTER — Inpatient Hospital Stay: Payer: Self-pay | Attending: Hematology and Oncology

## 2024-10-20 ENCOUNTER — Inpatient Hospital Stay

## 2024-10-22 ENCOUNTER — Inpatient Hospital Stay: Admitting: Hematology and Oncology

## 2024-10-28 ENCOUNTER — Inpatient Hospital Stay: Attending: Hematology and Oncology | Admitting: Hematology and Oncology
